# Patient Record
Sex: Male | Born: 1955
Health system: Southern US, Community
[De-identification: ages and names within clinical notes are randomized; demographics above are authoritative.]

## PROBLEM LIST (undated history)

## (undated) DIAGNOSIS — F32A Depression, unspecified: Secondary | ICD-10-CM

## (undated) DIAGNOSIS — F319 Bipolar disorder, unspecified: Secondary | ICD-10-CM

## (undated) DIAGNOSIS — F329 Major depressive disorder, single episode, unspecified: Secondary | ICD-10-CM

## (undated) DIAGNOSIS — G4733 Obstructive sleep apnea (adult) (pediatric): Secondary | ICD-10-CM

## (undated) DIAGNOSIS — F3170 Bipolar disorder, currently in remission, most recent episode unspecified: Secondary | ICD-10-CM

## (undated) DIAGNOSIS — I700 Atherosclerosis of aorta: Secondary | ICD-10-CM

## (undated) DIAGNOSIS — E119 Type 2 diabetes mellitus without complications: Secondary | ICD-10-CM

## (undated) DIAGNOSIS — I10 Essential (primary) hypertension: Secondary | ICD-10-CM

## (undated) DIAGNOSIS — F411 Generalized anxiety disorder: Secondary | ICD-10-CM

## (undated) DIAGNOSIS — H9190 Unspecified hearing loss, unspecified ear: Secondary | ICD-10-CM

## (undated) HISTORY — DX: Atherosclerosis of aorta: I70.0

## (undated) HISTORY — DX: Unspecified hearing loss, unspecified ear: H91.90

## (undated) HISTORY — PX: COLON SURGERY: SHX602

## (undated) HISTORY — DX: Bipolar disorder, currently in remission, most recent episode unspecified: F31.70

## (undated) HISTORY — DX: Type 2 diabetes mellitus without complications: E11.9

## (undated) HISTORY — DX: Obstructive sleep apnea (adult) (pediatric): G47.33

## (undated) HISTORY — PX: WISDOM TOOTH EXTRACTION: SHX21

## (undated) HISTORY — DX: Generalized anxiety disorder: F41.1

## (undated) HISTORY — PX: ANKLE SURGERY: SHX546

## (undated) HISTORY — PX: HAND SURGERY: SHX662

## (undated) HISTORY — DX: Essential (primary) hypertension: I10

## (undated) HISTORY — DX: Bipolar disorder, unspecified: F31.9

---

## 2000-10-30 ENCOUNTER — Ambulatory Visit (HOSPITAL_COMMUNITY): Admission: RE | Admit: 2000-10-30 | Discharge: 2000-10-30 | Payer: Self-pay | Admitting: Gastroenterology

## 2005-03-21 ENCOUNTER — Encounter: Admission: RE | Admit: 2005-03-21 | Discharge: 2005-03-21 | Payer: Self-pay | Admitting: Internal Medicine

## 2007-02-26 ENCOUNTER — Ambulatory Visit: Payer: Self-pay | Admitting: Psychology

## 2007-03-06 ENCOUNTER — Ambulatory Visit: Payer: Self-pay | Admitting: Psychology

## 2007-03-13 ENCOUNTER — Ambulatory Visit: Payer: Self-pay | Admitting: Psychology

## 2007-03-20 ENCOUNTER — Ambulatory Visit: Payer: Self-pay | Admitting: Psychology

## 2007-03-23 ENCOUNTER — Ambulatory Visit: Payer: Self-pay | Admitting: Psychology

## 2007-03-28 ENCOUNTER — Ambulatory Visit: Payer: Self-pay | Admitting: Psychology

## 2007-04-03 ENCOUNTER — Ambulatory Visit: Payer: Self-pay | Admitting: Psychology

## 2007-04-12 ENCOUNTER — Ambulatory Visit: Payer: Self-pay | Admitting: Psychology

## 2007-04-17 ENCOUNTER — Ambulatory Visit: Payer: Self-pay | Admitting: Psychology

## 2007-04-27 ENCOUNTER — Ambulatory Visit: Payer: Self-pay | Admitting: Psychology

## 2007-05-04 ENCOUNTER — Ambulatory Visit: Payer: Self-pay | Admitting: Psychology

## 2007-05-22 ENCOUNTER — Ambulatory Visit: Payer: Self-pay | Admitting: Psychology

## 2007-06-12 ENCOUNTER — Ambulatory Visit: Payer: Self-pay | Admitting: Psychology

## 2007-06-26 ENCOUNTER — Ambulatory Visit: Payer: Self-pay | Admitting: Psychology

## 2007-07-10 ENCOUNTER — Ambulatory Visit: Payer: Self-pay | Admitting: Psychology

## 2007-07-24 ENCOUNTER — Ambulatory Visit: Payer: Self-pay | Admitting: Psychology

## 2007-08-07 ENCOUNTER — Ambulatory Visit: Payer: Self-pay | Admitting: Psychology

## 2007-08-23 ENCOUNTER — Ambulatory Visit: Payer: Self-pay | Admitting: Psychology

## 2007-09-20 ENCOUNTER — Ambulatory Visit: Payer: Self-pay | Admitting: Psychology

## 2007-10-16 ENCOUNTER — Ambulatory Visit: Payer: Self-pay | Admitting: Psychology

## 2007-10-25 ENCOUNTER — Ambulatory Visit: Payer: Self-pay | Admitting: Psychology

## 2007-11-13 ENCOUNTER — Ambulatory Visit: Payer: Self-pay | Admitting: Psychology

## 2007-11-27 ENCOUNTER — Ambulatory Visit: Payer: Self-pay | Admitting: Psychology

## 2007-12-13 ENCOUNTER — Ambulatory Visit: Payer: Self-pay | Admitting: Psychology

## 2008-01-18 ENCOUNTER — Ambulatory Visit: Payer: Self-pay | Admitting: Psychology

## 2008-02-08 ENCOUNTER — Ambulatory Visit: Payer: Self-pay | Admitting: Psychology

## 2008-02-29 ENCOUNTER — Ambulatory Visit: Payer: Self-pay | Admitting: Psychology

## 2008-04-03 ENCOUNTER — Ambulatory Visit: Payer: Self-pay | Admitting: Psychology

## 2008-05-09 ENCOUNTER — Ambulatory Visit: Payer: Self-pay | Admitting: Psychology

## 2008-06-05 ENCOUNTER — Ambulatory Visit: Payer: Self-pay | Admitting: Psychology

## 2008-06-20 ENCOUNTER — Ambulatory Visit: Payer: Self-pay | Admitting: Psychology

## 2008-07-04 ENCOUNTER — Ambulatory Visit: Payer: Self-pay | Admitting: Psychology

## 2008-07-18 ENCOUNTER — Ambulatory Visit: Payer: Self-pay | Admitting: Psychology

## 2008-08-01 ENCOUNTER — Ambulatory Visit: Payer: Self-pay | Admitting: Psychology

## 2008-08-15 ENCOUNTER — Ambulatory Visit: Payer: Self-pay | Admitting: Psychology

## 2008-09-12 ENCOUNTER — Ambulatory Visit: Payer: Self-pay | Admitting: Psychology

## 2008-09-26 ENCOUNTER — Ambulatory Visit: Payer: Self-pay | Admitting: Psychology

## 2008-10-10 ENCOUNTER — Ambulatory Visit: Payer: Self-pay | Admitting: Psychology

## 2008-11-21 ENCOUNTER — Ambulatory Visit: Payer: Self-pay | Admitting: Psychology

## 2008-12-04 ENCOUNTER — Ambulatory Visit: Payer: Self-pay | Admitting: Psychology

## 2008-12-19 ENCOUNTER — Ambulatory Visit: Payer: Self-pay | Admitting: Psychology

## 2009-01-16 ENCOUNTER — Ambulatory Visit: Payer: Self-pay | Admitting: Psychology

## 2009-02-10 ENCOUNTER — Ambulatory Visit: Payer: Self-pay | Admitting: Psychology

## 2009-02-24 ENCOUNTER — Ambulatory Visit: Payer: Self-pay | Admitting: Psychology

## 2009-03-24 ENCOUNTER — Ambulatory Visit: Payer: Self-pay | Admitting: Psychology

## 2009-04-07 ENCOUNTER — Ambulatory Visit: Payer: Self-pay | Admitting: Psychology

## 2011-01-21 NOTE — Procedures (Signed)
[facility]  Patient:    Devin Ellison, Devin Ellison                     MRN: [id number] Proc. Date: 10/30/00 Adm. Date:  [id number] Attending:  [name] CC:         Evette Georges, M.D. [facility]   Procedure Report  PROCEDURES PERFORMED:  Colonoscopy.  MEDICATIONS:  Fentanyl 125 mcg, Versed 12 mg IV.  SCOPE UTILIZED:  Olympus adult video colonoscope.  INDICATIONS:  A nice 55 year old gentleman who has had previous colon polyps. He had a very flat villous adenoma of the cecum that required laparoscopically-assisted removal under general anesthesia several years ago. He had a colonoscopy three years ago that was negative. This is done as a three-year follow-up.  DESCRIPTION OF PROCEDURE:  The procedure had been explained to the patient and consent obtained. With the patient in the left lateral decubitus position the Olympus video pediatric video colonoscope was inserted and advanced under direct visualization. The prep was excellent and we were able to advance to the right lower quadrant under the surgical scar, what was felt to be the ileocecal valve and we saw the appendiceal orifice. There were a lot of surgical alterations there from his previous surgery. The light transilluminated it free. The scope was withdrawn. The cecum, ascending colon, hepatic flexure, transverse colon, splenic flexure, descending colon and sigmoid colon were seen well upon withdrawal and no polyps were seen. There is no significant diverticular disease. The scope was withdrawn. The patient tolerated the procedure well and was maintained on low-flow oxygen and pulse oximetry throughout the procedure without obvious problem.  ASSESSMENT:  No evidence of further colon polyps.  PLAN: 1. We plan on recommending repeating procedure in 5 years. 2. Would suggest yearly Hemoccults. DD:  10/30/00 TD:  10/31/00 Job: [id number]

## 2013-04-30 ENCOUNTER — Emergency Department (HOSPITAL_COMMUNITY)
Admission: EM | Admit: 2013-04-30 | Discharge: 2013-04-30 | Disposition: A | Discharge status: Other Acute Inpatient Hospital | Payer: 59 | Attending: Emergency Medicine | Admitting: Emergency Medicine

## 2013-04-30 ENCOUNTER — Inpatient Hospital Stay (HOSPITAL_COMMUNITY)
Admission: AD | Admit: 2013-04-30 | Discharge: 2013-05-06 | DRG: 885 | Disposition: A | Discharge status: Home / Self Care | Payer: 59 | Source: Intra-hospital | Attending: Psychiatry | Admitting: Psychiatry

## 2013-04-30 ENCOUNTER — Encounter (HOSPITAL_COMMUNITY): Payer: Self-pay

## 2013-04-30 ENCOUNTER — Encounter (HOSPITAL_COMMUNITY): Payer: Self-pay | Admitting: Emergency Medicine

## 2013-04-30 DIAGNOSIS — F603 Borderline personality disorder: Secondary | ICD-10-CM | POA: Diagnosis present

## 2013-04-30 DIAGNOSIS — F329 Major depressive disorder, single episode, unspecified: Secondary | ICD-10-CM

## 2013-04-30 DIAGNOSIS — T394X2A Poisoning by antirheumatics, not elsewhere classified, intentional self-harm, initial encounter: Secondary | ICD-10-CM | POA: Insufficient documentation

## 2013-04-30 DIAGNOSIS — F3010 Manic episode without psychotic symptoms, unspecified: Secondary | ICD-10-CM

## 2013-04-30 DIAGNOSIS — F3160 Bipolar disorder, current episode mixed, unspecified: Principal | ICD-10-CM | POA: Diagnosis present

## 2013-04-30 DIAGNOSIS — R45851 Suicidal ideations: Secondary | ICD-10-CM

## 2013-04-30 DIAGNOSIS — Z79899 Other long term (current) drug therapy: Secondary | ICD-10-CM

## 2013-04-30 DIAGNOSIS — F309 Manic episode, unspecified: Secondary | ICD-10-CM | POA: Insufficient documentation

## 2013-04-30 DIAGNOSIS — F17200 Nicotine dependence, unspecified, uncomplicated: Secondary | ICD-10-CM | POA: Insufficient documentation

## 2013-04-30 DIAGNOSIS — R4689 Other symptoms and signs involving appearance and behavior: Secondary | ICD-10-CM

## 2013-04-30 DIAGNOSIS — F32A Depression, unspecified: Secondary | ICD-10-CM

## 2013-04-30 DIAGNOSIS — F6089 Other specific personality disorders: Secondary | ICD-10-CM | POA: Insufficient documentation

## 2013-04-30 HISTORY — DX: Major depressive disorder, single episode, unspecified: F32.9

## 2013-04-30 HISTORY — DX: Depression, unspecified: F32.A

## 2013-04-30 LAB — RAPID URINE DRUG SCREEN, HOSP PERFORMED
Barbiturates: NOT DETECTED
Cocaine: NOT DETECTED
Opiates: NOT DETECTED
Tetrahydrocannabinol: NOT DETECTED

## 2013-04-30 LAB — COMPREHENSIVE METABOLIC PANEL
ALT: 10 U/L (ref 0–53)
AST: 12 U/L (ref 0–37)
Alkaline Phosphatase: 75 U/L (ref 39–117)
BUN: 10 mg/dL (ref 6–23)
CO2: 28 mEq/L (ref 19–32)
Creatinine, Ser: 0.85 mg/dL (ref 0.50–1.35)
Glucose, Bld: 116 mg/dL — ABNORMAL HIGH (ref 70–99)

## 2013-04-30 LAB — ACETAMINOPHEN LEVEL: Acetaminophen (Tylenol), Serum: <15 ug/mL (ref 10–30)

## 2013-04-30 LAB — CBC
Hemoglobin: 15.3 g/dL (ref 13.0–17.0)
MCHC: 34.3 g/dL (ref 30.0–36.0)
RBC: 4.83 MIL/uL (ref 4.22–5.81)

## 2013-04-30 LAB — ETHANOL: Alcohol, Ethyl (B): <11 mg/dL (ref 0–11)

## 2013-04-30 LAB — SALICYLATE LEVEL: Salicylate Lvl: <2 mg/dL — ABNORMAL LOW (ref 2.8–20.0)

## 2013-04-30 MED ORDER — MAGNESIUM HYDROXIDE 400 MG/5ML PO SUSP: 30.0000 mL | Freq: Every day | ORAL | Status: DC | PRN

## 2013-04-30 MED ORDER — POTASSIUM CHLORIDE CRYS ER 20 MEQ PO TBCR
20.0000 meq | EXTENDED_RELEASE_TABLET | Freq: Two times a day (BID) | ORAL | Status: AC
  Administered 2013-05-01 – 2013-05-02 (×3): 20 meq via ORAL
  Filled 2013-04-30 (×5): qty 1

## 2013-04-30 MED ORDER — LAMOTRIGINE 200 MG PO TABS
200.0000 mg | ORAL_TABLET | Freq: Every day | ORAL | Status: DC
  Administered 2013-04-30: 200 mg via ORAL
  Filled 2013-04-30: qty 1

## 2013-04-30 MED ORDER — ZOLPIDEM TARTRATE 5 MG PO TABS: 5.0000 mg | ORAL_TABLET | Freq: Every evening | ORAL | Status: DC | PRN

## 2013-04-30 MED ORDER — ALUM & MAG HYDROXIDE-SIMETH 200-200-20 MG/5ML PO SUSP: 30.0000 mL | ORAL | Status: DC | PRN

## 2013-04-30 MED ORDER — ACETAMINOPHEN 325 MG PO TABS: 650.0000 mg | ORAL_TABLET | ORAL | Status: DC | PRN

## 2013-04-30 MED ORDER — ACETAMINOPHEN 325 MG PO TABS: 650.0000 mg | ORAL_TABLET | Freq: Four times a day (QID) | ORAL | Status: DC | PRN

## 2013-04-30 MED ORDER — NICOTINE 21 MG/24HR TD PT24: 21.0000 mg | MEDICATED_PATCH | Freq: Every day | TRANSDERMAL | Status: DC

## 2013-04-30 MED ORDER — TRAZODONE HCL 100 MG PO TABS
50.0000 mg | ORAL_TABLET | Freq: Every evening | ORAL | Status: DC | PRN
  Administered 2013-05-01 – 2013-05-03 (×3): 50 mg via ORAL
  Filled 2013-04-30 (×15): qty 1

## 2013-04-30 MED ORDER — ACETAMINOPHEN 325 MG PO TABS
650.0000 mg | ORAL_TABLET | Freq: Four times a day (QID) | ORAL | Status: DC | PRN
Start: 2013-04-30 — End: 2013-05-06

## 2013-04-30 MED ORDER — NICOTINE 21 MG/24HR TD PT24
21.0000 mg | MEDICATED_PATCH | Freq: Every day | TRANSDERMAL | Status: DC
  Filled 2013-04-30: qty 1

## 2013-04-30 MED ORDER — NICOTINE 21 MG/24HR TD PT24
21.0000 mg | MEDICATED_PATCH | Freq: Every day | TRANSDERMAL | Status: DC
  Administered 2013-05-01 – 2013-05-06 (×6): 21 mg via TRANSDERMAL
  Filled 2013-04-30 (×9): qty 1

## 2013-04-30 MED ORDER — LAMOTRIGINE 100 MG PO TABS
200.0000 mg | ORAL_TABLET | Freq: Every day | ORAL | Status: DC
  Administered 2013-05-01 – 2013-05-06 (×6): 200 mg via ORAL
  Filled 2013-04-30 (×3): qty 1
  Filled 2013-04-30: qty 2
  Filled 2013-04-30 (×4): qty 1
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
## 2013-04-30 NOTE — BH Assessment (Signed)
Per [facility] NP, pt accepted to [facility], 501-1. Updated shift report & notified TTS that voluntary paperwork will need to be completed as writer's shift is ending and there isn't a TS available to cover next shift.   Evette Cristal, [place] Assessment Counselor

## 2013-04-30 NOTE — Progress Notes (Signed)
Pt confirms pcp is Dr "Reita Cliche" Dolittle Pt reports not seeing doctor in a long time but would return to see MD if needed EPIC updated
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
## 2013-04-30 NOTE — Progress Notes (Signed)
Pt didn't attend

## 2013-04-30 NOTE — ED Notes (Signed)
i asked patient if he would allow me to take his shoe strings out so he can transport across the street he started cursing and saying "my shoes cost a 150 motherfucking dollars why would you take some shoe strings out of my shoes if i wanted to do something i would of took this and he grabbed the vital machine cords and did something i explained to him i was just going by the policies and he yelled no rn latricia is aware

## 2013-04-30 NOTE — ED Notes (Signed)
Pt states SI/SA states took 63 200mg  iboprofen at 0830, pt cooperative, a/o x4

## 2013-04-30 NOTE — ED Provider Notes (Signed)
CSN: [id number]     Arrival date & time 04/30/13  1013 History   First MD Initiated Contact with Patient 04/30/13 1043     Chief Complaint  Patient presents with  . Drug Overdose   (Consider location/radiation/quality/duration/timing/severity/associated sxs/prior Treatment) HPI  Devin Ellison is a 57 year old male with past medical history of bipolar disorder he is currently taking Lamictal.  The patient was brought in by EMS.  He apparently called them and stated that he took 63 200 mg ibuprofen this morning at 8:30 AM approximately 3 and half hours ago.  The patient states that this is not true.  The patient states that he lied about taking the medication and because his girlfriend left him and he is unable to live without her.  He states that "God sent me this angel and I can't live without her."  The patient has pressured speech and labile behavior.  He is jovial and introduced himself stating that he is from [place].  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  The patient denies actual suicidal behavior but endorsed is severe depression and.  He states he has been taking supplemental as directed.  He denies audiovisual hallucinations or homicidal ideation.  The patient has a week the psychiatric history.  He states he is followed by an outpatient therapist.  Past Medical History  Diagnosis Date  . Depression    Past Surgical History  Procedure Laterality Date  . Colon surgery    . Ankle surgery    . Hand surgery     No family history on file. History  Substance Use Topics  . Smoking status: Current Every Day Smoker -- 1.00 packs/day    Types: Cigarettes  . Smokeless tobacco: Not on file  . Alcohol Use: Yes     Comment: social    Review of Systems  Constitutional: Negative for fever and chills.  Respiratory: Negative for cough and shortness of breath.   Cardiovascular: Negative for chest pain and  palpitations.  Gastrointestinal: Negative for vomiting, abdominal pain, diarrhea and constipation.  Genitourinary: Negative for dysuria, urgency and frequency.  Musculoskeletal: Negative for myalgias and arthralgias.  Skin: Negative for rash.  Neurological: Negative for headaches.  Psychiatric/Behavioral: Positive for suicidal ideas and decreased concentration. Negative for sleep disturbance and self-injury. The patient is hyperactive.     Allergies  Review of patient's allergies indicates no known allergies.  Home Medications  No current outpatient prescriptions on file. BP 157/99  Pulse 74  Temp(Src) 98.2 F (36.8 C) (Oral)  Resp 20  SpO2 100% Physical Exam  Nursing note and vitals reviewed. Constitutional: He appears well-developed and well-nourished. No distress.  HENT:  Head: Normocephalic and atraumatic.  Eyes: Conjunctivae are normal. No scleral icterus.  Neck: Normal range of motion. Neck supple.  Cardiovascular: Normal rate, regular rhythm and normal heart sounds.   Pulmonary/Chest: Effort normal and breath sounds normal. No respiratory distress.  Abdominal: Soft. There is no tenderness.  Musculoskeletal: He exhibits no edema.  Neurological: He is alert.  Skin: Skin is warm and dry. He is not diaphoretic.  Psychiatric: His affect is labile. His speech is rapid and/or pressured and tangential. He is hyperactive. He is not agitated, not aggressive and not combative. Thought content is delusional (questionable delusions of grandeur). Thought content is not paranoid. Cognition and memory are normal. He expresses impulsivity. He expresses no homicidal and no suicidal ideation. He expresses no  suicidal plans and no homicidal plans. He is attentive.    ED Course  Procedures (including critical care time) Labs Review Labs Reviewed  CBC - Abnormal; Notable for the following:    WBC 10.7 (*)    All other components within normal limits  COMPREHENSIVE METABOLIC PANEL -  Abnormal; Notable for the following:    Sodium 134 (*)    Potassium 3.2 (*)    Glucose, Bld 116 (*)    All other components within normal limits  SALICYLATE LEVEL - Abnormal; Notable for the following:    Salicylate Lvl <2.0 (*)    All other components within normal limits  ETHANOL  ACETAMINOPHEN LEVEL  URINE RAPID DRUG SCREEN (HOSP PERFORMED)   Imaging Review No results found.  MDM  No diagnosis found. Patient here after calling EMS for supposed that suicide attempt with Advil.  At emergency department he denies doing this.  The patient does have some labile emotions, speech is pressured and tangential, possible delusions of grandeur.  The patient appears well he is alert and oriented x4.  He denies active suicidal behavior but states he cannot live without his girlfriend.  His behavior seems manipulative.  No homicidal ideation or audiovisual hallucinations.  Labs are currently pending.  I will not begin treatment for Advil overdose until labs are complete.  Patient labs appear within normal limits.  I have spoken with the act team who will assess the patient.  He is able to move to the psychiatric ED.  The patient may need psychiatric evaluation, inpatient care or change in his medications.   he is exhibiting manic behavior  Arthor Captain, PA-C 04/30/13 1553

## 2013-04-30 NOTE — ED Notes (Signed)
Pt's wife is visiting at bedside. Security aware of situation

## 2013-04-30 NOTE — Tx Team (Signed)
Initial Interdisciplinary Treatment Plan  PATIENT STRENGTHS: (choose at least two) Average or above average intelligence Capable of independent living Communication skills Financial means Physical Health Supportive family/friends  PATIENT STRESSORS: Marital or family conflict   PROBLEM LIST: Problem List/Patient Goals Date to be addressed Date deferred Reason deferred Estimated date of resolution  Depression 04/30/2013     Suicide Risk 04/30/2013                                                DISCHARGE CRITERIA:  Improved stabilization in mood, thinking, and/or behavior Need for constant or close observation no longer present Verbal commitment to aftercare and medication compliance  PRELIMINARY DISCHARGE PLAN: Outpatient therapy Return to previous living arrangement Return to previous work or school arrangements  PATIENT/FAMIILY INVOLVEMENT: This treatment plan has been presented to and reviewed with the patient, Devin Ellison, and/or family member. The patient and family have been given the opportunity to ask questions and make suggestions.  Renaee Munda 04/30/2013, 10:02 PM

## 2013-05-01 DIAGNOSIS — F3160 Bipolar disorder, current episode mixed, unspecified: Principal | ICD-10-CM

## 2013-05-01 MED ORDER — ARIPIPRAZOLE 5 MG PO TABS
5.0000 mg | ORAL_TABLET | Freq: Every day | ORAL | Status: DC
  Administered 2013-05-01 – 2013-05-03 (×3): 5 mg via ORAL
  Filled 2013-05-01 (×5): qty 1

## 2013-05-01 NOTE — Tx Team (Signed)
Interdisciplinary Treatment Plan Update (Adult)  Date: 05/01/2013  Time Reviewed:  9:45 AM  Progress in Treatment: Attending groups: Yes Participating in groups:  Yes Taking medication as prescribed:  Yes Tolerating medication:  Yes Family/Significant othe contact made: CSW assessing  Patient understands diagnosis:  Yes Discussing patient identified problems/goals with staff:  Yes Medical problems stabilized or resolved:  Yes Denies suicidal/homicidal ideation: Yes Issues/concerns per patient self-inventory:  Yes Other:  New problem(s) identified: N/A  Discharge Plan or Barriers: CSW assessing for appropriate referrals.  Reason for Continuation of Hospitalization: Anxiety Depression Medication Stabilization  Comments: N/A  Estimated length of stay: 3-5 days  For review of initial/current patient goals, please see plan of care.  Attendees: Patient:     Family:     Physician:  Dr. Johnalagadda 05/01/2013 10:37 AM   Nursing:   Ronecia Byrd, RN 05/01/2013 10:37 AM   Clinical Social Worker:  Eladio Dentremont Horton, LCSWA 05/01/2013 10:37 AM   Other: Neil Mashburn, PA 05/01/2013 10:37 AM   Other:  Maseta Dorley, MA care coordination 05/01/2013 10:37 AM   Other:  Quylle Hodnett, LCSW 05/01/2013 10:37 AM   Other:  Jennifer Clark, RN 05/01/2013 10:37 AM   Other:    Other:    Other:    Other:    Other:    Other:     Scribe for Treatment Team:   Horton, Zayonna Ayuso Nicole, 05/01/2013 10:37 AM    

## 2013-05-01 NOTE — Progress Notes (Signed)
Adult Psychoeducational Group Note  Date:  05/01/2013 Time:  9:12 PM  Group Topic/Focus:  Wrap-Up Group:   The focus of this group is to help patients review their daily goal of treatment and discuss progress on daily workbooks.  Participation Level:  Active  Participation Quality:  Appropriate  Affect:  Appropriate  Cognitive:  Alert  Insight: Appropriate  Engagement in Group:  Engaged  Modes of Intervention:  Discussion  Additional Comments:  Pt stated that he had a "shitty" day. He was upset because he did not have a blanket on his bed, and he feels that the dr.is not listening to him/more his needs. However, he finds comfort in knowing that he is not the only one going through these issues.  Kaleen Odea R 05/01/2013, 9:12 PM

## 2013-05-01 NOTE — [facility] Suicide Risk Assessment (Signed)
Suicide Risk Assessment  Admission Assessment     Nursing information obtained from:  Patient Demographic factors:  Male;Divorced or widowed;Caucasian Current Mental Status:  Suicidal ideation indicated by patient;Self-harm thoughts Loss Factors:  Loss of significant relationship Historical Factors:  NA Risk Reduction Factors:  Employed;Living with another person, especially a relative;Positive social support  CLINICAL FACTORS:   Severe Anxiety and/or Agitation Bipolar Disorder:   Depressive phase Depression:   Aggression Anhedonia Hopelessness Impulsivity Insomnia Recent sense of peace/wellbeing Severe Unstable or Poor Therapeutic Relationship Previous Psychiatric Diagnoses and Treatments  COGNITIVE FEATURES THAT CONTRIBUTE TO RISK:  Closed-mindedness Loss of executive function Polarized thinking Thought constriction (tunnel vision)    SUICIDE RISK:   Moderate:  Frequent suicidal ideation with limited intensity, and duration, some specificity in terms of plans, no associated intent, good self-control, limited dysphoria/symptomatology, some risk factors present, and identifiable protective factors, including available and accessible social support.  PLAN OF CARE: Patient is admitted from [facility] for depression and suicidal ideation without specific plan. He has been on psychiatric care since 2005. He was seen dr. Toni Arthurs and Dr. Kem Kays in the past. He has broke up with his girl friend and divorced 2011.   I certify that inpatient services furnished can reasonably be expected to improve the patient's condition.   Devin Ellison., MD 05/01/2013, 7:35 PM
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
## 2013-05-01 NOTE — Progress Notes (Signed)
Adult Psychoeducational Group Note  Date:  05/01/2013 Time:  11:01 AM  Group Topic/Focus:  Personal Choices and Values:   The focus of this group is to help patients assess and explore the importance of values in their lives, how their values affect their decisions, how they express their values and what opposes their expression.  Participation Level:  Active  Participation Quality:  Appropriate and Attentive  Affect:  Appropriate  Cognitive:  Alert and Appropriate  Insight: Appropriate  Engagement in Group:  Engaged  Modes of Intervention:  Discussion  Additional Comments:  Today's group was a therapeutic activity. Pt attended group and was very engaged.     [place] Shi K 05/01/2013, 11:01 AM

## 2013-05-01 NOTE — Progress Notes (Signed)
D.  Pt. Reports passive SI with no plan.  Denies HI and denies A/V hallucinations.   Cast on left hand.  No pain reported. A.  Encouraged pt. To attend groups and work on Pharmacologist. R.  Pt. Contracts for safety and remains safe.

## 2013-05-01 NOTE — Progress Notes (Signed)
Adult Psychoeducational Group Note  Date:  05/01/2013 Time:  11:00AM Group Topic/Focus:  Personal Developement  Participation Level:  Active  Participation Quality:  Appropriate and Attentive  Affect:  Appropriate  Cognitive:  Alert and Appropriate  Insight: Appropriate  Engagement in Group:  Engaged  Modes of Intervention:  Discussion  Additional Comments:   Pt. Was attentive and appropriate during today's group discussion. Pt. Was able to write down values. Pt was able to decide if the way you are living now in live with those values. Pt. Was able to break down values in groups values, what do i want to happen and wham am i going to do about it.   Bing Plume D 05/01/2013, 11:45 AM

## 2013-05-01 NOTE — Progress Notes (Signed)
Recreation Therapy Notes  Date: 08.27.2014 Time: 3:00pm Location: [address] Dayroom  Group Topic: Leisure Education  Goal Area(s) Addresses:  Patient will verbalize activity of interest by end of group session. Patient will verbalize the ability to use positive leisure/recreation as a coping mechanism.  Behavioral Response: Appropriate, Engaged  Intervention: Air traffic controller  Activity: Leisure Time Management. Patients were given an worksheet outlining approximately 16 hours of the day, using various colors patients were asked to identify how they use their time. For example red = time at work, blue = time for self-care.   Education:  Discharge Planning.   Education Outcome: Acknowledges understanding  Clinical Observations/Feedback: Patient attended group session and completed activity. Patient did not contribute to opening or wrap up discussion, but appeared to actively listen as he maintained appropriate eye contact with speaker.   Marykay Lex Shermar Friedland, LRT/CTRS  Jearl Klinefelter 05/01/2013 4:25 PM

## 2013-05-01 NOTE — Progress Notes (Signed)
D: Patient appropriate and cooperative with staff and peers. Patient's affect/mood is depressed. He reported on the self inventory sheet that he slept well, appetite is good, energy level is low and ability to pay attention is poor. Patient rated depression and feelings of hopelessness "10". Writer observed patient interacting with peers in the dayroom. He's compliant with medication regimen and attending groups.  A: Support and encouragement provided to patient. Scheduled medications administered per MD orders. Maintain Q15 minute checks for safety.  R: Patient receptive. Passive SI, but contracts for safety. Denies SI/HI/AVH. Patient remains safe on the unit.

## 2013-05-01 NOTE — [facility] Group Notes (Signed)
[facility] LCSW Group Therapy  05/01/2013  1:15 PM   Type of Therapy:  Group Therapy  Participation Level:  Active  Participation Quality:  Appropriate and Attentive  Affect:  Appropriate, Irritable, Agitated, Depressed  Cognitive:  Alert and Appropriate  Insight:  Developing/Improving and Engaged  Engagement in Therapy:  Developing/Improving and Engaged  Modes of Intervention:  Clarification, Confrontation, Discussion, Education, Exploration, Limit-setting, Orientation, Problem-solving, Rapport Building, Dance movement psychotherapist, Socialization and Support  Summary of Progress/Problems: The topic for group today was emotional regulation.  This group focused on both positive and negative emotion identification and allowed group members to process ways to identify feelings, regulate negative emotions, and find healthy ways to manage internal/external emotions. Group members were asked to reflect on a time when their reaction to an emotion led to a negative outcome and explored how alternative responses using emotion regulation would have benefited them. Group members were also asked to discuss a time when emotion regulation was utilized when a negative emotion was experienced.  Pt states that he is dealing with hopelessness and depression.  Pt states that he has lost everything and doesn't see a purpose for living.  Pt states that the only thing that got him through in the past is ex wife/exgirlfriend.  Pt was unable to further process and presents hopeless and helpless at this time.  Pt actively listened to group discussion.    Devin Ellison, [place] 05/01/2013 2:53 PM

## 2013-05-01 NOTE — [facility] Group Notes (Signed)
[facility] LCSW Aftercare Discharge Planning Group Note  05/01/2013 8:45 AM  Participation Quality:  Alert and Appropriate   Mood/Affect:  Appropriate, Irritable, Agitated   Depression Rating: 10  Anxiety Rating:  10  Thoughts of Suicide:  Pt denies HI, endorses SI stating "if I had the opportunity"  Will you contract for safety?   Yes  Current AVH:  Pt denies  Plan for Discharge/Comments:  Pt attended discharge planning group and actively participated in group.  CSW provided pt with today's workbook.  Pt states that he came to the hospital for suicidal ideation. Pt states that he has outpatient providers in [place], where he resides.  CSW will assess for appropriate referrals.    Transportation Means: Pt denies access to transportation at this time  Supports: No supports mentioned at this time  Devin Ellison, LCSWA 05/01/2013 10:15 AM

## 2013-05-01 NOTE — Consult Note (Signed)
Agree with plan 

## 2013-05-02 DIAGNOSIS — F339 Major depressive disorder, recurrent, unspecified: Secondary | ICD-10-CM

## 2013-05-02 NOTE — Progress Notes (Signed)
Patient ID: Devin Ellison, male   DOB: 09/09/1955, 57 y.o.   MRN: [id number] D: Took over patient's care @ 2330. Patient in bed sleeping. Respiration regular and unlabored. No sign of distress noted at this time A: 15 mins checks for safety. R: Patient remains asleep. Pt is safe.

## 2013-05-02 NOTE — B[facility] Counselor (Signed)
Adult Comprehensive Assessment  Patient ID: Devin Ellison, male   DOB: 1956/03/05, 57 y.o.   MRN: [id number]  Information Source: Information source: Patient  Current Stressors:  Educational / Learning stressors: N/A Employment / Job issues: Employed - enjoys job Family Relationships: N/A Surveyor, quantity / Lack of resources (include bankruptcy): N/A Housing / Lack of housing: N/A Physical health (include injuries & life threatening diseases): broke hand when frustrated last week Social relationships: recent break up with girlfriend was stressful and SI Substance abuse: N/A Bereavement / Loss: N/A  Living/Environment/Situation:  Living Arrangements: Other (Comment) Living conditions (as described by patient or guardian): Pt states that he has a house in [place] and lives with a house maid.  Pt states that this is a good environment.  How long has patient lived in current situation?: 3 years What is atmosphere in current home: Supportive;Loving;Comfortable  Family History:  Marital status: Separated Separated, when?: 3 years ago separated from wife, recent broke up with long term girlfriend What types of issues is patient dealing with in the relationship?: pt states his mental health and alcohol abuse caused conflict in his relationships Additional relationship information: N/A Does patient have children?: Yes How many children?: 2 How is patient's relationship with their children?: Pt states that he has a good relationship with children.    Childhood History:  By whom was/is the patient raised?: Both parents Additional childhood history information: Pt states that he had a perfect childhood.  Pt states that his mom stayed at home with them and father worked.   Description of patient's relationship with caregiver when they were a child: Pt states that he got along well with parents growing up.  Patient's description of current relationship with people who raised him/her: Father is deceased,  mother in a retirement center in [place].  Pt states that he still visits his mom often. Does patient have siblings?: Yes Number of Siblings: 4 Description of patient's current relationship with siblings: Pt states that he has a good relationship with 2 living siblings.   Did patient suffer any verbal/emotional/physical/sexual abuse as a child?: No Did patient suffer from severe childhood neglect?: No Has patient ever been sexually abused/assaulted/raped as an adolescent or adult?: No Was the patient ever a victim of a crime or a disaster?: No Witnessed domestic violence?: No Has patient been effected by domestic violence as an adult?: No  Education:  Highest grade of school patient has completed: [employer] degree Currently a student?: No Learning disability?: No  Employment/Work Situation:   Employment situation: Employed Where is patient currently employed?: [employer] How long has patient been employed?: 4 months Patient's job has been impacted by current illness: Yes Describe how patient's job has been impacted: pt states that 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 due to his mental health and alcohol abuse What is the longest time patient has a held a job?: 28 years 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 Has patient ever been in the [employer]?: No Has patient ever served in combat?: No  Financial Resources:   Financial resources: Income from [employer] insurance Does patient have a representative payee or guardian?: No  Alcohol/Substance Abuse:   What has been your use of drugs/alcohol within the last 12 months?: Pt denies alcohol and drug abuse If attempted suicide, did drugs/alcohol play a role in this?: No Alcohol/Substance Abuse Treatment Hx: Denies past history If yes, describe treatment: N/A Has alcohol/substance abuse ever caused legal problems?: No  Social  Support System:   Patient's Community Support System: Good Describe Community  Support System: Pt states that his ex wife and family are very supportive. Type of faith/religion: [employer]riam Knuckles How does patient's faith help to cope with current illness?: prayer, church attendance, sing in the choir, attend seminars  Leisure/Recreation:   Leisure and Hobbies: play tennis, skiing, hike, reading, cook   Strengths/Needs:   What things does the patient do well?: pt states that he is good at anything he puts his mind to.  In what areas does patient struggle / problems for patient: Depression, anxiety, SI  Discharge Plan:   Does patient have access to transportation?: Yes Will patient be returning to same living situation after discharge?: Yes Currently receiving community mental health services: Yes (From Whom) ([facility] - Cat Zada Girt, NP, [facility]) If no, would patient like referral for services when discharged?: Yes (What county?) Does patient have financial barriers related to discharge medications?: No  Summary/Recommendations:     Patient is a 57 year old Caucasian Male with a diagnosis of Mood Disorder NOS.  Patient lives in [place] alone.  Pt states that a recent break up triggered depression and SI. Patient will benefit from crisis stabilization, medication evaluation, group therapy and psycho education in addition to case management for discharge planning.    [place], Salome Arnt. 05/02/2013

## 2013-05-02 NOTE — Progress Notes (Signed)
Adult Psychoeducational Group Note  Date:  05/02/2013 Time:  9:47 PM  Group Topic/Focus:  Making Healthy Choices:   The focus of this group is to help patients identify negative/unhealthy choices they were using prior to admission and identify positive/healthier coping strategies to replace them upon discharge.  Participation Level:  Active  Participation Quality:  Appropriate and Supportive  Affect:  Appropriate  Cognitive:  Alert and Appropriate  Insight: Appropriate  Engagement in Group:  Engaged and Supportive  Modes of Intervention:  Problem-solving  Additional Comments:  Devin Ellison was able to share with the group how he was having relationship problems.  He stated that being in [facility] gives him time to focus on himself and how he can make things better.    [name] 05/02/2013, 9:47 PM

## 2013-05-02 NOTE — [facility] Suicide Risk Assessment (Signed)
[facility] INPATIENT:  Family/Significant Other Suicide Prevention Education  Suicide Prevention Education:  Education Completed; Camarion Weier - wife [phone]),  (name of family member/significant other) has been identified by the patient as the family member/significant other with whom the patient will be residing, and identified as the person(s) who will aid the patient in the event of a mental health crisis (suicidal ideations/suicide attempt).  With written consent from the patient, the family member/significant other has been provided the following suicide prevention education, prior to the and/or following the discharge of the patient.  The suicide prevention education provided includes the following:  Suicide risk factors  Suicide prevention and interventions  National Suicide Hotline telephone number  [facility] assessment telephone number  [facility] Emergency Assistance 911  [facility] and/or Residential Mobile Crisis Unit telephone number  Request made of family/significant other to:  Remove weapons (e.g., guns, rifles, knives), all items previously/currently identified as safety concern.    Remove drugs/medications (over-the-counter, prescriptions, illicit drugs), all items previously/currently identified as a safety concern.  The family member/significant other verbalizes understanding of the suicide prevention education information provided.  The family member/significant other agrees to remove the items of safety concern listed above.  Wife wants to know if pt actually took the Advil as an overdose attempt, as she states pt lies a lot and doesn't know if he is telling the truth about it or not.    Carmina Miller 05/02/2013, 10:03 AM

## 2013-05-02 NOTE — [facility] Group Notes (Signed)
[facility] LCSW Group Therapy  [facility] 1:15 - 2:30 PM  05/02/2013   Type of Therapy:  Group Therapy  Participation Level:  Active  Participation Quality:  Attentive  Affect:  Appropriate  Cognitive:  Appropriate  Insight:  Developing/Improving and Engaged  Engagement in Therapy:  Developing/Improving Engaged  Modes of Intervention:  Discussion, Education, Exploration, Problem-Solving, Rapport Building, Support   Summary of Progress/Problems:  Patient listened attentively to speaker from [facility].  Patient shared he plans to try to connect to a [facility] when he returns to [place].   Wynn Banker 05/02/2013

## 2013-05-02 NOTE — Progress Notes (Signed)
Adult Psychoeducational Group Note  Date:  05/02/2013 Time:  11:00AM Group Topic/Focus:  Leisure and Lifeystyles Changes  Participation Level:  Active  Participation Quality:  Appropriate and Attentive  Affect:  Appropriate  Cognitive:  Alert and Appropriate  Insight: Appropriate  Engagement in Group:  Engaged  Modes of Intervention:  Discussion  Additional Comments:  Pt. Was attentive and appropriate during today's group discussion. Pt. Was able to complete self esteem worksheet on positive thing about yourself for each letter A-Z. Pt was able to come up with the following words in a group setting Nice and Zealous.   Bing Plume D 05/02/2013, 11:51 AM

## 2013-05-02 NOTE — Progress Notes (Signed)
Adult Psychoeducational Group Note  Date:  05/02/2013 Time:  9:00 AM  Group Topic/Focus:  Rediscovering Joy:   The focus of this group is to explore various ways to relieve stress in a positive manner.  Participation Level:  Active  Participation Quality:  Appropriate and Attentive  Affect:  Appropriate  Cognitive:  Alert and Oriented  Insight: Appropriate  Engagement in Group:  Engaged  Modes of Intervention:  Discussion  Additional Comments:  Pt. was not able to attend group in it's entirety because he had to be seen by the Child psychotherapist.  Inocente Barban E 05/02/2013, 10:37 AM

## 2013-05-02 NOTE — Progress Notes (Signed)
D: Patient's affect/mood is depressed. He reported on the self inventory sheet that he slept well, appetite is good, energy level is normal and ability to pay attention is improving. Patient rated depression and feelings of hopelessness "7". He voiced that he was feeling a little sad today, but much better than yesterday and the day he was admitted here to [facility].  A: Support and encouragement provided to patient. Administered scheduled medications per ordering MD. Monitor Q15 minute checks for safety.  R: Patient receptive. Denies SI/HI/AVH. Patient remains safe on the unit.

## 2013-05-02 NOTE — Progress Notes (Signed)
[facility] Group Notes:  (Nursing/MHT/Case Management/Adjunct)  Date:  05/02/2013  Time:  10:38 AM  Type of Therapy:  Therapeutic Activity  Participation Level:  Active  Participation Quality:  Appropriate and Attentive  Affect:  Appropriate  Cognitive:  Appropriate  Insight:  Appropriate  Engagement in Group:  Engaged  Modes of Intervention:  Activity and Socialization  Summary of Progress/Problems: Pt attended group and actively participated.  Caswell Corwin 05/02/2013, 10:38 AM

## 2013-05-02 NOTE — Progress Notes (Signed)
[facility] MD Progress Note  05/02/2013 1:52 PM Devin Ellison  MRN:  [id number]  Subjective:  Patient has been adjusting to the unit and feels depressed, anxious and worried. He has stated that his heart was broken, but start thinking that he has to move on, he spoke with his GF, Devin Bible and has decided to keep the friendship. He stated that he is feeling frustrated because he wants to spend rest of his life with her. He was angry and punched a wall prior to coming to the hospital on Friday night 04/26/13 and has broken 5 th meta tarsal, boxer's fracture and placed on splint, than seen a ortho and has placed cast on it. He has been taking his medication both Lamictal and Abilify and feels they are helping him. He has clear sensorium and able to think properly. He has denied current suicidal thoughts. He rates his depression 7/10, anxiety 6/10 and anger 1/10. His ex wife is supportive of him and visiting him in hospital. His GF is [place], [place] and unable to visit him.   Diagnosis:   DSM5: Schizophrenia Disorders:   Obsessive-Compulsive Disorders:   Trauma-Stressor Disorders:   Substance/Addictive Disorders:   Depressive Disorders:    Axis I: Bipolar, mixed vs MDD, recurrent   ADL's:  Intact  Sleep: Fair  Appetite:  Fair  Suicidal Ideation:  Continue to have suicidal but says he has less frequent thoughts Homicidal Ideation:  denied AEB (as evidenced by):  Psychiatric Specialty Exam: ROS  Blood pressure 120/91, pulse 92, temperature 97.9 F (36.6 C), temperature source Oral, resp. rate 16, height 5' 10.87" (1.8 m), weight 84.369 kg (186 lb).Body mass index is 26.04 kg/(m^2).  General Appearance: Casual and Guarded  Eye Contact::  Minimal  Speech:  Clear and Coherent  Volume:  Decreased  Mood:  Anxious, Depressed, Hopeless, Irritable and Worthless  Affect:  Constricted and Depressed  Thought Process:  Coherent and Goal Directed  Orientation:  Full (Time, Place, and Person)  Thought Content:   Obsessions and Rumination  Suicidal Thoughts:  Yes.  without intent/plan  Homicidal Thoughts:  No  Memory:  Immediate;   Fair  Judgement:  Impaired  Insight:  Lacking  Psychomotor Activity:  Psychomotor Retardation  Concentration:  Fair  Recall:  Fair  Akathisia:  NA  Handed:  Right  AIMS (if indicated):     Assets:  Communication Skills Desire for Improvement Housing Physical Health Resilience Social Support Transportation  Sleep:  Number of Hours: 6   Current Medications: Current Facility-Administered Medications  Medication Dose Route Frequency Provider Last Rate Last Dose  . acetaminophen (TYLENOL) tablet 650 mg  650 mg Oral Q6H PRN Shuvon Rankin, NP      . alum & mag hydroxide-simeth (MAALOX/MYLANTA) 200-200-20 MG/5ML suspension 30 mL  30 mL Oral Q4H PRN Shuvon Rankin, NP      . ARIPiprazole (ABILIFY) tablet 5 mg  5 mg Oral Daily Verne Spurr, PA-C   5 mg at 05/02/13 0801  . lamoTRIgine (LAMICTAL) tablet 200 mg  200 mg Oral Daily Shuvon Rankin, NP   200 mg at 05/02/13 0801  . magnesium hydroxide (MILK OF MAGNESIA) suspension 30 mL  30 mL Oral Daily PRN Shuvon Rankin, NP      . nicotine (NICODERM CQ - dosed in mg/24 hours) patch 21 mg  21 mg Transdermal Daily Kerry Hough, PA-C   21 mg at 05/02/13 0806  . potassium chloride SA (K-DUR,KLOR-CON) CR tablet 20 mEq  20 mEq Oral BID  Kerry Hough, PA-C   20 mEq at 05/02/13 0800  . traZODone (DESYREL) tablet 50 mg  50 mg Oral QHS,MR X 1 Spencer E Simon, PA-C   50 mg at 05/01/13 2141  . zolpidem (AMBIEN) tablet 5 mg  5 mg Oral QHS PRN Shuvon Rankin, NP        Lab Results: No results found for this or any previous visit (from the past 48 hour(s)).  Physical Findings: AIMS: Facial and Oral Movements Muscles of Facial Expression: None, normal Lips and Perioral Area: None, normal Jaw: None, normal Tongue: None, normal,Extremity Movements Upper (arms, wrists, hands, fingers): None, normal Lower (legs, knees, ankles, toes):  None, normal, Trunk Movements Neck, shoulders, hips: None, normal, Overall Severity Severity of abnormal movements (highest score from questions above): None, normal Incapacitation due to abnormal movements: None, normal Patient's awareness of abnormal movements (rate only patient's report): No Awareness, Dental Status Current problems with teeth and/or dentures?: No Does patient usually wear dentures?: No  CIWA:  CIWA-Ar Total: 0 COWS:  COWS Total Score: 0  Treatment Plan Summary: Daily contact with patient to assess and evaluate symptoms and progress in treatment Medication management  Plan: Continue Lamictal and Abilify and monitor for adverse effects and therapeutic responses 1. Admit for crisis management and stabilization. 2. Medication management to reduce current symptoms to base line and improve the patient's overall level of functioning. 3. Treat health problems as indicated. 4. Develop treatment plan to decrease risk of relapse upon discharge and to reduce the need for readmission. 5. Psycho-social education regarding relapse prevention and self care. 6. Health care follow up as needed for medical problems. 7. Restart home medications where appropriate.    Medical Decision Making Problem Points:  Established problem, worsening (2), Review of last therapy session (1) and Review of psycho-social stressors (1) Data Points:  Review or order clinical lab tests (1) Review or order medicine tests (1) Review of medication regiment & side effects (2) Review of new medications or change in dosage (2)  I certify that inpatient services furnished can reasonably be expected to improve the patient's condition.   Nehemiah Settle., MD 05/02/2013, 1:52 PM

## 2013-05-03 MED ORDER — ARIPIPRAZOLE 10 MG PO TABS
10.0000 mg | ORAL_TABLET | Freq: Every day | ORAL | Status: DC
  Administered 2013-05-04 – 2013-05-06 (×3): 10 mg via ORAL
  Filled 2013-05-03 (×4): qty 1

## 2013-05-03 NOTE — Progress Notes (Signed)
Date: 05/03/2013  Time: 9:11 PM  Group Topic/Focus:  Wrap-Up Group: The focus of this group is to help patients review their daily goal of treatment and discuss progress on daily workbooks.  Participation Level: Active  Participation Quality: Appropriate, Sharing and Supportive  Affect: Appropriate  Cognitive: Appropriate  Insight: Appropriate  Engagement in Group: Engaged and Supportive  Modes of Intervention: Discussion, Education and Support  Additional Comments: pt attend group, pt is working on getting on the right medication and coping skills for SI and depression.  Isla Pence M  05/03/2013, 9:11 PM

## 2013-05-03 NOTE — Tx Team (Signed)
Interdisciplinary Treatment Plan Update (Adult)  Date: 05/03/2013  Time Reviewed:  9:45 AM  Progress in Treatment: Attending groups: Yes Participating in groups:  Yes Taking medication as prescribed:  Yes Tolerating medication:  Yes Family/Significant othe contact made: Yes Patient understands diagnosis:  Yes Discussing patient identified problems/goals with staff:  Yes Medical problems stabilized or resolved:  Yes Denies suicidal/homicidal ideation: Yes Issues/concerns per patient self-inventory:  Yes Other:  New problem(s) identified: N/A  Discharge Plan or Barriers: Pt will follow up with [facility] and [facility] for medication management and therapy.    Reason for Continuation of Hospitalization: Anxiety Depression Medication Stabilization  Comments: N/A  Estimated length of stay: 3-5 days  For review of initial/current patient goals, please see plan of care.  Attendees: Patient:     Family:     Physician:  Dr. Javier Glazier 05/03/2013 10:47 AM   Nursing:   Lowella Grip, RN 05/03/2013 10:47 AM   Clinical Social Worker:  Reyes Ivan, LCSWA 05/03/2013 10:47 AM   Other: Verne Spurr, PA 05/03/2013 10:47 AM   Other:  Frankey Shown, MA care coordination 05/03/2013 10:47 AM   Other:  Juline Patch, LCSW 05/03/2013 10:47 AM   Other:     Other:    Other:    Other:    Other:    Other:    Other:     Scribe for Treatment Team:   Carmina Miller, 05/03/2013 10:47 AM

## 2013-05-03 NOTE — Progress Notes (Signed)
Patient ID: Devin Ellison, male   DOB: [birth year], 57 y.o.   MRN: [id number] [facility] MD Progress Note  05/03/2013 12:31 PM Devin Ellison  MRN:  [id number]  Subjective:  Patient has been feeling depressed, anxious and worried. He has stated that his heart was broken, but start thinking that he has to move on, he spoke with his GF, Patient has decided to keep the friendship with her. He stated that he is feeling frustrated because he wants to spend rest of his life with her.   He was angry and punched a wall prior to coming to the hospital on Friday night 04/26/13 and has broken 5 th meta tarsal, boxer's fracture and placed on splint, than seen a ortho and has placed cast on it. He has been compliant wit his medication Lamictal and Abilify and has no reported side effects.   He has denied current suicidal thoughts. He rates his depression 7/10, anxiety 6/10 and anger 1/10. His ex wife is supportive of him and visiting him in hospital. His GF lives is [place], [place].   Diagnosis:   DSM5: Schizophrenia Disorders:   Obsessive-Compulsive Disorders:   Trauma-Stressor Disorders:   Substance/Addictive Disorders:   Depressive Disorders:    Axis I: Bipolar, mixed vs MDD, recurrent   ADL's:  Intact  Sleep: Fair  Appetite:  Fair  Suicidal Ideation:  Continue to have suicidal but says he has less frequent thoughts Homicidal Ideation:  denied AEB (as evidenced by):  Psychiatric Specialty Exam: ROS  Blood pressure 155/127, pulse 85, temperature 97.8 F (36.6 C), temperature source Oral, resp. rate 18, height 5' 10.87" (1.8 m), weight 84.369 kg (186 lb).Body mass index is 26.04 kg/(m^2).  General Appearance: Casual and Guarded  Eye Contact::  Minimal  Speech:  Clear and Coherent  Volume:  Decreased  Mood:  Anxious, Depressed, Hopeless, Irritable and Worthless  Affect:  Constricted and Depressed  Thought Process:  Coherent and Goal Directed  Orientation:  Full (Time, Place, and Person)  Thought  Content:  Obsessions and Rumination  Suicidal Thoughts:  Yes.  without intent/plan  Homicidal Thoughts:  No  Memory:  Immediate;   Fair  Judgement:  Impaired  Insight:  Lacking  Psychomotor Activity:  Psychomotor Retardation  Concentration:  Fair  Recall:  Fair  Akathisia:  NA  Handed:  Right  AIMS (if indicated):     Assets:  Communication Skills Desire for Improvement Housing Physical Health Resilience Social Support Transportation  Sleep:  Number of Hours: 6.25   Current Medications: Current Facility-Administered Medications  Medication Dose Route Frequency Provider Last Rate Last Dose  . acetaminophen (TYLENOL) tablet 650 mg  650 mg Oral Q6H PRN Shuvon Rankin, NP      . alum & mag hydroxide-simeth (MAALOX/MYLANTA) 200-200-20 MG/5ML suspension 30 mL  30 mL Oral Q4H PRN Shuvon Rankin, NP      . [START ON 05/04/2013] ARIPiprazole (ABILIFY) tablet 10 mg  10 mg Oral Daily Nehemiah Settle, MD      . lamoTRIgine (LAMICTAL) tablet 200 mg  200 mg Oral Daily Shuvon Rankin, NP   200 mg at 05/03/13 0749  . magnesium hydroxide (MILK OF MAGNESIA) suspension 30 mL  30 mL Oral Daily PRN Shuvon Rankin, NP      . nicotine (NICODERM CQ - dosed in mg/24 hours) patch 21 mg  21 mg Transdermal Daily Kerry Hough, PA-C   21 mg at 05/03/13 0645  . traZODone (DESYREL) tablet 50 mg  50 mg Oral  QHS,MR X 1 Kerry Hough, PA-C   50 mg at 05/02/13 2235    Lab Results: No results found for this or any previous visit (from the past 48 hour(s)).  Physical Findings: AIMS: Facial and Oral Movements Muscles of Facial Expression: None, normal Lips and Perioral Area: None, normal Jaw: None, normal Tongue: None, normal,Extremity Movements Upper (arms, wrists, hands, fingers): None, normal Lower (legs, knees, ankles, toes): None, normal, Trunk Movements Neck, shoulders, hips: None, normal, Overall Severity Severity of abnormal movements (highest score from questions above): None,  normal Incapacitation due to abnormal movements: None, normal Patient's awareness of abnormal movements (rate only patient's report): No Awareness, Dental Status Current problems with teeth and/or dentures?: No Does patient usually wear dentures?: No  CIWA:  CIWA-Ar Total: 0 COWS:  COWS Total Score: 0  Treatment Plan Summary: Daily contact with patient to assess and evaluate symptoms and progress in treatment Medication management  Plan: Continue Lamictal and increase Abilify 10 mg Qhs for better control of manic symptoms and monitor for adverse effects and therapeutic responses 1. Admit for crisis management and stabilization. 2. Medication management to reduce current symptoms to base line and improve the patient's overall level of functioning. 3. Treat health problems as indicated. 4. Develop treatment plan to decrease risk of relapse upon discharge and to reduce the need for readmission. 5. Psycho-social education regarding relapse prevention and self care. 6. Health care follow up as needed for medical problems. 7. Restart home medications where appropriate.  Medical Decision Making Problem Points:  Established problem, worsening (2), Review of last therapy session (1) and Review of psycho-social stressors (1) Data Points:  Review or order clinical lab tests (1) Review or order medicine tests (1) Review of medication regiment & side effects (2) Review of new medications or change in dosage (2)  I certify that inpatient services furnished can reasonably be expected to improve the patient's condition.   Nehemiah Settle., MD 05/03/2013, 12:31 PM

## 2013-05-03 NOTE — [facility] Group Notes (Signed)
[facility] LCSW Aftercare Discharge Planning Group Note  05/03/2013 8:45 AM  Participation Quality:  Alert and Appropriate   Mood/Affect:  Appropriate, Flat and Depressed  Depression Rating: 3  Anxiety Rating:  3-4  Thoughts of Suicide:  Pt denies SI/HI  Will you contract for safety?   Yes  Current AVH:  Pt denies  Plan for Discharge/Comments:  Pt attended discharge planning group and actively participated in group.  Pt states that he is feeling better today. Pt states that he plans to spend time with his family in [place] upon d/c, before returning home in [place].  Pt has follow up scheduled at [facility] and [facility] for medication management and therapy. No further needs voiced by pt at this time.    Transportation Means: Pt denies access to transportation at this time  Supports: No supports mentioned at this time  Reyes Ivan, LCSWA 05/03/2013 9:50 AM

## 2013-05-03 NOTE — Progress Notes (Signed)
D: Patient pleasant and cooperative with staff and peers. Patient's affect is depressed and mood is anxious. He reported on the self inventory sheet that he slept well last night, appetite and ability to pay attention are both good and energy level is normal. Patient rated depression and feelings of hopelessness "3". Around noon today, patient began to express that he's wanting to be d/c and actually see his medical doctor to follow-up with his care. Patient signed a 72 hour request for discharge.  A: Support and encouragement provided to patient. Scheduled medications administered per MD orders. Maintain Q15 minute checks for safety.  R: Patient receptive. Denies SI/HI/AVH. Patient remains safe.

## 2013-05-03 NOTE — Progress Notes (Signed)
Patient ID: Devin Ellison, male   DOB: 08-13-1956, 57 y.o.   MRN: [id number] D: Patient stated he is doing well and was quite upset today because the doctor didn't discuss discharge date with him. Pt signed a 72 hour release. Pt mood/affect seemed depressed but appropriate.  Pt denies SI/HI/AVH and pain. Pt attended evening wrap up group and was engaged and supportive of peers. Pt stated he learned in group since  been at [facility]  is recognize triggers and early warning signs to help him deal with his depression.  Pt denies any needs or concerns.  Cooperative with assessment. No acute distressed noted at this time.   A: Met with pt 1:1. Medications administered as prescribed. Writer encouraged pt to discuss feelings. Pt encouraged to come to staff with any question or concerns. 15 minutes checks for safety.  R: Patient remains safe. He is complaint with medications and denies any adverse reaction. Continue current POC.

## 2013-05-03 NOTE — [facility] Group Notes (Signed)
[facility] LCSW Group Therapy  05/03/2013  1:15 PM   Type of Therapy:  Group Therapy  Participation Level:  Active  Participation Quality:  Appropriate and Attentive  Affect:  Appropriate and Flat  Cognitive:  Alert and Appropriate  Insight:  Developing/Improving and Engaged  Engagement in Therapy:  Developing/Improving and Engaged  Modes of Intervention:  Clarification, Confrontation, Discussion, Education, Exploration, Limit-setting, Orientation, Problem-solving, Rapport Building, Dance movement psychotherapist, Socialization and Support  Summary of Progress/Problems: The topic for today was feelings about relapse.  Pt discussed what relapse prevention is to them and identified triggers that they are on the path to relapse.  Pt processed their feeling towards relapse and was able to relate to peers.  Pt discussed coping skills that can be used for relapse prevention.  Pt discussed the recent loss of his marriage and break up with a girlfriend causing this relapse of depression and SI.  Pt states that through being here he has learned to not let relationships consume him or dictate how he should feel.  Pt states that he plans to not isolate and talk to his support system.  Pt verbalizes great insight but it is questionable if pt truly feels this way.  Pt actively participated and was engaged in group discussion.    Devin Ellison, LCSWA 05/03/2013 2:04 PM

## 2013-05-03 NOTE — Progress Notes (Signed)
Patient ID: Devin Ellison, male   DOB: 10-18-1955, 57 y.o.   MRN: [id number] D: Took over patient's care @ 2330. Patient in bed sleeping. Respiration regular and unlabored. No sign of distress noted at this time A: 15 mins checks for safety. R: Patient  is safe.

## 2013-05-03 NOTE — ED Provider Notes (Signed)
Medical screening examination/treatment/procedure(s) were performed by non-physician practitioner and as supervising physician I was immediately available for consultation/collaboration.   Claudean Kinds, MD 05/03/13 [phone]

## 2013-05-03 NOTE — Progress Notes (Signed)
Adult Psychoeducational Group Note  Date:  05/03/2013 Time:  12:09 PM  Group Topic/Focus:  Relapse Prevention Planning:   The focus of this group is to define relapse and discuss the need for planning to combat relapse.  Participation Level:  Minimal  Participation Quality:  Inattentive  Affect:  Lethargic  Cognitive:  Appropriate  Insight: Good  Engagement in Group:  Lacking and Poor  Modes of Intervention:  Discussion, Socialization and Support  Additional Comments:  Pt came to group and was sleeping on occasion. Pt did share that breathing techniques and talking to a friend will be his go to techniques when faced with relapse.   Cathlean Cower 05/03/2013, 12:09 PM

## 2013-05-04 DIAGNOSIS — F411 Generalized anxiety disorder: Secondary | ICD-10-CM

## 2013-05-04 DIAGNOSIS — F332 Major depressive disorder, recurrent severe without psychotic features: Secondary | ICD-10-CM

## 2013-05-04 NOTE — [facility] Group Notes (Signed)
[facility] Group Notes:  (Clinical Social Work)  05/04/2013   3:00-4:00PM  Summary of Progress/Problems:   The main focus of today's process group was for the patient to identify ways in which they have sabotaged their own mental health wellness/recovery.  Motivational interviewing was used to explore the reasons they engage in this behavior, and reasons they may have for wanting to change.  The Stages of Change were explained to the group using a handout, and patients identified where they are with regard to changing self-defeating behaviors.  The patient expressed that he isolates himself and while doing this is consumed by obsessive thoughts, which became worse consistently due to the isolation and inability to have someone "reality check" them for him.  He does have 2 friends that he talks to at times, but he does not want to overburden them.  He is in Preparation Stage, has signed up for 2 support groups, and wants to continue to work on the self-help aspect of his issues.  Type of Therapy:  Process Group  Participation Level:  Active  Participation Quality:  Attentive, Monopolizing, Redirectable and Sharing  Affect:  Blunted  Cognitive:  Appropriate and Oriented  Insight:  Developing/Improving  Engagement in Therapy:  Engaged  Modes of Intervention:  Education, Motivational Interviewing   Ambrose Mantle, LCSW 05/04/2013, 4:39 PM

## 2013-05-04 NOTE — Progress Notes (Signed)
Adult Psychoeducational Group Note  Date:  05/04/2013 Time:  1:15pm Group Topic/Focus:  Therapeutic Activity  Participation Level:  Active  Participation Quality:  Appropriate and Attentive  Affect:  Appropriate  Cognitive:  Appropriate  Insight: Appropriate  Engagement in Group:  Engaged  Modes of Intervention:  Discussion and Education  Additional Comments:  Pt attended and participated in group.  Shelly Bombard D 05/04/2013, 2:14 PM

## 2013-05-04 NOTE — Progress Notes (Addendum)
Patient ID: Devin Ellison, male   DOB: 08-23-1956, 57 y.o.   MRN: [id number] 05-04-13  Nursing shift note: D: RN did 1:1 with pt this am. He came here from [facility] for this hospitalization. He had a suicide attempt by OD on Advil. He stated he remains depressed, has a hx of bipolar disorder and denied any si/hi/av presently. A: staff will support and encourage this patient. Also during the assessment with RN he omitted important facts about his overall situation. R: on his inventory sheet he wrote he slept well, appetite good, energy normal, attention good with his depression and hopelessness both at 2. He has had no physical problems in the last 24 hrs. Pain is a 1. After discharge he plans to "not isolate, recognize waring sign and apply coping skills". He wants to know what he needs to achieve or do to be discharged. Can someone tell me what is needed for me to improve? This was discussed in the 1:1 with RN.

## 2013-05-04 NOTE — Progress Notes (Signed)
Patient ID: Devin Ellison, male   DOB: 1956-02-04, 57 y.o.   MRN: [id number] Psychoeducational Group Note  Date:  05/04/2013 Time:1100am  Group Topic/Focus:  Identifying Needs:   The focus of this group is to help patients identify their personal needs that have been historically problematic and identify healthy behaviors to address their needs.  Participation Level:  Active  Participation Quality:  Appropriate  Affect:  Appropriate  Cognitive:  Appropriate  Insight:  Supportive  Engagement in Group:  Supportive  Additional Comments:  Psychoeducational group - life skills.   Valente David 05/04/2013,12:13 PM

## 2013-05-04 NOTE — Progress Notes (Signed)
[facility] MD Progress Note  05/04/2013 1:56 PM Devin Ellison  MRN:  [id number] Subjective:  3/10 depression, denies suicidal/homicidal ideations and hallucinations, upset with himself because he lied to his girlfriend and never told his adult children about his relationship.  His girlfriend ended their relationship on Sunday due to his dishonesty and just wants to be friends.  They have been together a year and half, he was separated a year and a half before they met and did not think he would ever get over his ex-wife but then fell madly in love with her.  He told her he was divorced but was only separated.  Grandiose at times with his job. Diagnosis:   DSM5:  Depressive Disorders:  Major Depressive Disorder - Severe (296.23)  Axis I: Anxiety Disorder NOS and Major Depression, Recurrent severe Axis II: Deferred Axis III:  Past Medical History  Diagnosis Date  . Depression    Axis IV: other psychosocial or environmental problems, problems related to social environment and problems with primary support group Axis V: 41-50 serious symptoms  ADL's:  Intact  Sleep: Good  Appetite:  Good  Suicidal Ideation:  Denies Homicidal Ideation:  Denies  Psychiatric Specialty Exam: Review of Systems  Constitutional: Negative.   HENT: Negative.   Eyes: Negative.   Respiratory: Negative.   Cardiovascular: Negative.   Gastrointestinal: Negative.   Genitourinary: Negative.   Musculoskeletal: Negative.   Skin: Negative.   Neurological: Negative.   Endo/Heme/Allergies: Negative.   Psychiatric/Behavioral: Positive for depression. The patient is nervous/anxious.     Blood pressure 132/97, pulse 85, temperature 97.2 F (36.2 C), temperature source Oral, resp. rate 16, height 5' 10.87" (1.8 m), weight 84.369 kg (186 lb).Body mass index is 26.04 kg/(m^2).  General Appearance: Casual  Eye Contact::  Fair  Speech:  Normal Rate  Volume:  Normal  Mood:  Anxious and Depressed  Affect:  Congruent   Thought Process:  Coherent  Orientation:  Full (Time, Place, and Person)  Thought Content:  WDL  Suicidal Thoughts:  No  Homicidal Thoughts:  No  Memory:  Immediate;   Fair Recent;   Fair Remote;   Fair  Judgement:  Impaired  Insight:  Fair  Psychomotor Activity:  Normal  Concentration:  Fair  Recall:  Fair  Akathisia:  No  Handed:  Right  AIMS (if indicated):     Assets:  Communication Skills Resilience Social Support  Sleep:  Number of Hours: 5.25   Current Medications: Current Facility-Administered Medications  Medication Dose Route Frequency Provider Last Rate Last Dose  . acetaminophen (TYLENOL) tablet 650 mg  650 mg Oral Q6H PRN Shuvon Rankin, NP      . alum & mag hydroxide-simeth (MAALOX/MYLANTA) 200-200-20 MG/5ML suspension 30 mL  30 mL Oral Q4H PRN Shuvon Rankin, NP      . ARIPiprazole (ABILIFY) tablet 10 mg  10 mg Oral Daily Nehemiah Settle, MD   10 mg at 05/04/13 0814  . lamoTRIgine (LAMICTAL) tablet 200 mg  200 mg Oral Daily Shuvon Rankin, NP   200 mg at 05/04/13 0814  . magnesium hydroxide (MILK OF MAGNESIA) suspension 30 mL  30 mL Oral Daily PRN Shuvon Rankin, NP      . nicotine (NICODERM CQ - dosed in mg/24 hours) patch 21 mg  21 mg Transdermal Daily Kerry Hough, PA-C   21 mg at 05/04/13 0813  . traZODone (DESYREL) tablet 50 mg  50 mg Oral QHS,MR X 1 Kerry Hough, PA-C  50 mg at 05/03/13 2236    Lab Results: No results found for this or any previous visit (from the past 48 hour(s)).  Physical Findings: AIMS: Facial and Oral Movements Muscles of Facial Expression: None, normal Lips and Perioral Area: None, normal Jaw: None, normal Tongue: None, normal,Extremity Movements Upper (arms, wrists, hands, fingers): None, normal Lower (legs, knees, ankles, toes): None, normal, Trunk Movements Neck, shoulders, hips: None, normal, Overall Severity Severity of abnormal movements (highest score from questions above): None, normal Incapacitation due  to abnormal movements: None, normal Patient's awareness of abnormal movements (rate only patient's report): No Awareness, Dental Status Current problems with teeth and/or dentures?: No Does patient usually wear dentures?: No  CIWA:  CIWA-Ar Total: 0 COWS:  COWS Total Score: 0  Treatment Plan Summary: Daily contact with patient to assess and evaluate symptoms and progress in treatment Medication management  Plan:  Review of chart, vital signs, medications, and notes. 1-Individual and group therapy 2-Medication management for depression and anxiety:  Medications reviewed with the patient and no untoward effects, no changes made 3-Coping skills for depression and anxiety 4-Continue crisis stabilization and management 5-Address health issues--monitoring vital signs, stable 6-Treatment plan in progress to prevent relapse of depression and anxiety  Medical Decision Making Problem Points:  Established problem, stable/improving (1) and Review of psycho-social stressors (1) Data Points:  Review of medication regiment & side effects (2)  I certify that inpatient services furnished can reasonably be expected to improve the patient's condition.   Nanine Means, PMH-NP 05/04/2013, 1:56 PM

## 2013-05-05 NOTE — Progress Notes (Signed)
Patient ID: Devin Ellison, male   DOB: 1956/04/18, 57 y.o.   MRN: [id number] Psychoeducational Group Note  Date:  05/05/2013 Time:  0930am  Group Topic/Focus:  Making Healthy Choices:   The focus of this group is to help patients identify negative/unhealthy choices they were using prior to admission and identify positive/healthier coping strategies to replace them upon discharge.  Participation Level:  Active  Participation Quality:  Appropriate  Affect:  Appropriate  Cognitive:  Appropriate  Insight:  Supportive  Engagement in Group:  Supportive  Additional Comments:  Psychoeducational group-greatfulness.   Valente David 05/05/2013,10:10 AM

## 2013-05-05 NOTE — Progress Notes (Signed)
Patient ID: Devin Ellison, male   DOB: 1956-01-05, 57 y.o.   MRN: [id number] 05-05-13 nursing shift note: RN did 1:1 with pt. Pt has filled out a 72 hour request for discharge form on 05-03-13 @ 1225 pm. He stated that he is feeling better and that the psycheducational groups have been helpful. He denied any si/hi/av. A: staff will continue to support and encourage. He stated he is not having adverse effects from his medications. R: on his inventory sheet he wrote he slept well, appetite good, energy normal, attention good with is depression and hopelessness both at 1. W/d symptoms and na and he didn't address any pain issues. After discharge he plans to "stay with professional care, therapy groups and focus on coping". RN will monitor and Q 15 min ck's continue.

## 2013-05-05 NOTE — Progress Notes (Signed)
Patient ID: ZIMIR KITTLESON, male   DOB: 04/24/1956, 57 y.o.   MRN: [id number] Psychoeducational Group Note  Date:  05/05/2013 Time:  1045am  Group Topic/Focus:  Making Healthy Choices:   The focus of this group is to help patients identify negative/unhealthy choices they were using prior to admission and identify positive/healthier coping strategies to replace them upon discharge.  Participation Level:  Active  Participation Quality:  Appropriate  Affect:  Appropriate  Cognitive:  Appropriate  Insight:  Supportive  Engagement in Group:  Supportive  Additional Comments:  Psychoeducational group-life skills   Valente David 05/05/2013,12:08 PM

## 2013-05-05 NOTE — Progress Notes (Signed)
The focus of this group is to help patients review their daily goal of treatment and discuss progress on daily workbooks. Pt attended the evening group session and responded to all discussion prompts from the Writer. Pt reported having a good day on the unit, the best day here yet. Pt cited "interesting groups and good discussions" as to the reason why today was so good. Pt shared that he was eager to discharge home and that he had no additional needs from Nursing Staff this evening. Pt's affect was appropriate.

## 2013-05-05 NOTE — [facility] Group Notes (Signed)
[facility] Group Notes:  (Clinical Social Work)  05/05/2013   3:00-4:00PM  Summary of Progress/Problems:   The main focus of today's process group was to   identify the patient's current support system and decide on other supports that can be put in place.  The picture on workbook was used to discuss why additional supports are needed, and a hand-out was distributed with four definitions/levels of support, then used to talk about how patients have given and received all different kinds of support.  An emphasis was placed on using counselor, doctor, therapy groups, 12-step groups, and problem-specific support groups to expand supports.  The patient identified one additional support as being (1) 2 support groups that are in his home area of [place], and (2) a willingness to reach back out to family and friends in order not to overburden people he has been relying on so heavily/burning out.  Type of Therapy:  Process Group  Participation Level:  Active  Participation Quality:  Attentive and Sharing  Affect:  Blunted  Cognitive:  Appropriate and Oriented  Insight:  Engaged  Engagement in Therapy:  Engaged  Modes of Intervention:  Education,  Support and [employer], LCSW 05/05/2013, 4:32 PM

## 2013-05-05 NOTE — Progress Notes (Signed)
Pt resting in bed with eyes closed. Breathing even and unlabored. Q15 min safety checks maintained. Will continue to monitor pt.

## 2013-05-05 NOTE — Progress Notes (Signed)
Patient ID: Devin Ellison, male   DOB: October 22, 1955, 57 y.o.   MRN: [id number] D)  Has been out and about on the hall this evening, attended group, participating and interacting with staff and peers appropriately.  Was on the phone, went to bed fairly early, didn't come to med window for any prns.  Currently resting with eyes closed, resp reg, unlabored, appears to be sleeping. A) Will continue to monitor for safety, continue POC R)  Safety maintained.

## 2013-05-05 NOTE — Progress Notes (Signed)
[facility] MD Progress Note  05/05/2013 12:21 PM JAYRO MCMATH  MRN:  [id number] Subjective:  Patient denies depression, 2/10 anxiety, denies suicidal/homicidal ideations, sleep "good", appetite "perfect"--eating three meals daily with snacks.  He spoke to his family last night, children-2 sisters-wife-and girlfriend and everyone is surprised that he sounds so good.  Rolf has enjoyed the week-end groups and grateful that he stayed the week-end but would like to discharge tomorrow.  He does appear grandiose at times when talking about his wife (not been together for two years) and how they are best friends and he does not want their relationship to end, together since they were 14.  When his girlfriend is mentioned, she is the love of his life and his role model for coping skills that he is learning in groups this week-end, and how he hopes to influence her to return to their relationship.  Less irritable today, yesterday he was angry he was still in the hospital, today is grateful.  Hypomanic behaviors at times noted.  Diagnosis:   DSM5:  Depressive Disorders:  Major Depressive Disorder - Severe (296.23)  Axis I: Anxiety Disorder NOS and Major Depression, Recurrent severe Axis II: Deferred Axis III:  Past Medical History  Diagnosis Date  . Depression    Axis IV: other psychosocial or environmental problems, problems related to social environment and problems with primary support group Axis V: 41-50 serious symptoms  ADL's:  Intact  Sleep: Good  Appetite:  Good  Suicidal Ideation:  Denies Homicidal Ideation:  Denies  Psychiatric Specialty Exam: Review of Systems  Constitutional: Negative.   HENT: Negative.   Eyes: Negative.   Respiratory: Negative.   Cardiovascular: Negative.   Gastrointestinal: Negative.   Genitourinary: Negative.   Musculoskeletal: Negative.   Skin: Negative.   Neurological: Negative.   Endo/Heme/Allergies: Negative.   Psychiatric/Behavioral: The patient is  nervous/anxious.     Blood pressure 141/88, pulse 75, temperature 98.2 F (36.8 C), temperature source Oral, resp. rate 18, height 5' 10.87" (1.8 m), weight 84.369 kg (186 lb).Body mass index is 26.04 kg/(m^2).  General Appearance: Casual  Eye Contact::  Fair  Speech:  Normal Rate  Volume:  Normal  Mood:  Anxious  Affect:  Congruent  Thought Process:  Coherent  Orientation:  Full (Time, Place, and Person)  Thought Content:  WDL  Suicidal Thoughts:  No  Homicidal Thoughts:  No  Memory:  Immediate;   Fair Recent;   Fair Remote;   Fair  Judgement:  Fair  Insight:  Lacking  Psychomotor Activity:  Normal  Concentration:  Fair  Recall:  Fair  Akathisia:  No  Handed:  Right  AIMS (if indicated):     Assets:  Communication Skills Physical Health Resilience Social Support  Sleep:  Number of Hours: 5.5   Current Medications: Current Facility-Administered Medications  Medication Dose Route Frequency Provider Last Rate Last Dose  . acetaminophen (TYLENOL) tablet 650 mg  650 mg Oral Q6H PRN Shuvon Rankin, NP      . alum & mag hydroxide-simeth (MAALOX/MYLANTA) 200-200-20 MG/5ML suspension 30 mL  30 mL Oral Q4H PRN Shuvon Rankin, NP      . ARIPiprazole (ABILIFY) tablet 10 mg  10 mg Oral Daily Nehemiah Settle, MD   10 mg at 05/05/13 0756  . lamoTRIgine (LAMICTAL) tablet 200 mg  200 mg Oral Daily Shuvon Rankin, NP   200 mg at 05/05/13 0756  . magnesium hydroxide (MILK OF MAGNESIA) suspension 30 mL  30 mL Oral Daily PRN  Shuvon Rankin, NP      . nicotine (NICODERM CQ - dosed in mg/24 hours) patch 21 mg  21 mg Transdermal Daily Kerry Hough, PA-C   21 mg at 05/05/13 0756  . traZODone (DESYREL) tablet 50 mg  50 mg Oral QHS,MR X 1 Kerry Hough, PA-C   50 mg at 05/03/13 2236    Lab Results: No results found for this or any previous visit (from the past 48 hour(s)).  Physical Findings: AIMS: Facial and Oral Movements Muscles of Facial Expression: None, normal Lips and  Perioral Area: None, normal Jaw: None, normal Tongue: None, normal,Extremity Movements Upper (arms, wrists, hands, fingers): None, normal Lower (legs, knees, ankles, toes): None, normal, Trunk Movements Neck, shoulders, hips: None, normal, Overall Severity Severity of abnormal movements (highest score from questions above): None, normal Incapacitation due to abnormal movements: None, normal Patient's awareness of abnormal movements (rate only patient's report): No Awareness, Dental Status Current problems with teeth and/or dentures?: No Does patient usually wear dentures?: No  CIWA:  CIWA-Ar Total: 0 COWS:  COWS Total Score: 0  Treatment Plan Summary: Daily contact with patient to assess and evaluate symptoms and progress in treatment Medication management  Plan:  Review of chart, vital signs, medications, and notes. 1-Individual and group therapy 2-Medication management for depression and anxiety:  Medications reviewed with the patient and no untoward effects, no changes made 3-Coping skills for depression and anxiety 4-Continue crisis stabilization and management 5-Address health issues--monitoring vital signs, stable 6-Treatment plan in progress to prevent relapse of depression and anxiety  Medical Decision Making Problem Points:  Established problem, stable/improving (1) and Review of psycho-social stressors (1) Data Points:  Review of medication regiment & side effects (2)  I certify that inpatient services furnished can reasonably be expected to improve the patient's condition.   Nanine Means, PMH-NP 05/05/2013, 12:21 PM

## 2013-05-05 NOTE — [facility] Group Notes (Signed)
Adult Psychoeducational Group Note  Date:  05/05/2013 Time:  9:11 PM  Group Topic/Focus:  Wrap-Up Group:   The focus of this group is to help patients review their daily goal of treatment and discuss progress on daily workbooks.  Participation Level:  Active  Participation Quality:  Appropriate  Affect:  Appropriate  Cognitive:  Appropriate  Insight: Appropriate  Engagement in Group:  Engaged  Modes of Intervention:  Discussion  Additional Comments:  Mart said that he had the best weekend of his life in dealing with his depression.  He stated he learned a lot about his depression and coping skills to deal with the depression.  He also expressed that he has a plan in place for his discharge tomorrow and he is determined to see it through and not let anyone stop him from succeeding.   Caroll Rancher A 05/05/2013, 9:11 PM

## 2013-05-06 MED ORDER — ARIPIPRAZOLE 10 MG PO TABS: 10.0000 mg | ORAL_TABLET | Freq: Every day | ORAL | Status: DC

## 2013-05-06 MED ORDER — TRAZODONE HCL 100 MG PO TABS
50.0000 mg | ORAL_TABLET | Freq: Every day | ORAL | Status: DC
  Filled 2013-05-06: qty 4

## 2013-05-06 MED ORDER — TRAZODONE HCL 50 MG PO TABS: ORAL_TABLET | ORAL | Status: DC

## 2013-05-06 MED ORDER — LAMOTRIGINE 200 MG PO TABS: 200.0000 mg | ORAL_TABLET | Freq: Every day | ORAL | Status: DC

## 2013-05-06 NOTE — Progress Notes (Signed)
[facility] Adult Case Management Discharge Plan :  Will you be returning to the same living situation after discharge: Yes,  return to home in [place] At discharge, do you have transportation home?:No.  Patient denies, LCSW to assess with patient and arrange. Do you have the ability to pay for your medications:Yes,  no barriers  Release of information consent forms completed and in the chart;  Patient's signature needed at discharge.  Patient to Follow up at: Follow-up Information   Follow up with [facility] for [facility] On 05/09/2013. (Appointment scheduled at 1:00 pm with Sidonie Dickens for therapy)    Contact information:   [address] [place] [address] Phone: [phone] Fax: [phone]       Follow up with [facility] On 05/14/2013. (Appointment scheduled at 9:40 am with Garlan Fillers, NP for medication management)    Contact information:   [address] [place], [place] [address] Phone: [phone]  Fax: [phone]      Patient denies SI/HI:   Yes,  no barriers.    Safety Planning and Suicide Prevention discussed:  Yes,  completed, see SI note  Patient signed 72 hour demand for DC. Patient to DC today as no evidence of safety or problems.  Devin Ellison, Devin Ellison 05/06/2013, 9:51 AM

## 2013-05-06 NOTE — Progress Notes (Signed)
Discharge note: Pt denies SI/HI/depression. Pt stated that he developed coping skills to help him deal with depression. Pt received verbal and written discharge instructions. Pt agreed to f/u appt and med regimen. Pt received sample meds and prescriptions. Pt safely left [facility] with his girlfriend.

## 2013-05-06 NOTE — Progress Notes (Signed)
Adult Psychoeducational Group Note  Date:  05/06/2013 Time:  10:00am Group Topic/Focus:  Self Care:   The focus of this group is to help patients understand the importance of self-care in order to improve or restore emotional, physical, spiritual, interpersonal, and financial health.  Participation Level:  Active  Participation Quality:  Appropriate and Attentive  Affect:  Appropriate  Cognitive:  Alert and Appropriate  Insight: Appropriate  Engagement in Group:  Engaged  Modes of Intervention:  Discussion and Education  Additional Comments:  Pt attended and participated in group. When asked what self care meant to him he stated that enjoying everyday life and not stressing about things we have no control over.  Shelly Bombard D 05/06/2013, 11:10 AM

## 2013-05-06 NOTE — [facility] Suicide Risk Assessment (Signed)
Suicide Risk Assessment  Discharge Assessment     Demographic Factors:  Male, Caucasian and Unemployed  Mental Status Per Nursing Assessment::   On Admission:  Suicidal ideation indicated by patient;Self-harm thoughts  Current Mental Status by Physician: NA  Loss Factors: Financial problems/change in socioeconomic status  Historical Factors: Prior suicide attempts and Impulsivity  Risk Reduction Factors:   Responsible for children under 59 years of age, Sense of responsibility to family, Religious beliefs about death, Living with another person, especially a relative, Positive social support, Positive therapeutic relationship and Positive coping skills or problem solving skills  Continued Clinical Symptoms:  Bipolar Disorder:   Mixed State  Cognitive Features That Contribute To Risk:  Polarized thinking    Suicide Risk:  Minimal: No identifiable suicidal ideation.  Patients presenting with no risk factors but with morbid ruminations; may be classified as minimal risk based on the severity of the depressive symptoms  Discharge Diagnoses:   AXIS I:  Bipolar, mixed AXIS II:  Deferred AXIS III:   Past Medical History  Diagnosis Date  . Depression    AXIS IV:  economic problems, occupational problems, other psychosocial or environmental problems, problems related to social environment and problems with primary support group AXIS V:  61-70 mild symptoms  Plan Of Care/Follow-up recommendations:  Activity:  as tolerated Diet:  Regular  Is patient on multiple antipsychotic therapies at discharge:  No   Has Patient had three or more failed trials of antipsychotic monotherapy by history:  No  Recommended Plan for Multiple Antipsychotic Therapies: NA  Nehemiah Settle., MD 05/06/2013, 12:39 PM

## 2013-05-06 NOTE — Discharge Summary (Signed)
Physician Discharge Summary Note  Patient:  Devin Ellison is an 57 y.o., male MRN:  [id number] DOB:  05-02-1956 Patient phone:  [phone] (home)  Patient address:   [phone] [place] [address],   Date of Admission:  04/30/2013 Date of Discharge: 05/06/2013  Reason for Admission:  Suicide attempt  Discharge Diagnoses: Principal Problem:   MDD (major depressive disorder), single episode  ROS  DSM5:  Axis Diagnosis:  Depressive Disorders: Bipolar disorder  AXIS I: Bipolar, mixed  AXIS II: Borderline Personality Dis. and Cluster B Traits  AXIS III:  Past Medical History   Diagnosis  Date   .  Depression     AXIS IV: problems with primary support group  AXIS V: 51-60 moderate symptoms   Level of Care:  OP  Hospital Course:       Devin Ellison was admitted voluntarily after he called an ambulance stating he had taken an overdose of Advil in a suicide attempt, after his girlfriend broke up with him. He reported that he has a history of bipolar disorder for which he sees Dr. Gerre Scull about every 6 months. He was taking Lamictal as his only medication. He later admitted that the Advil overdose had been a lie.      Upon admission to the adult unit Devin Ellison was evaluated and medication management was initiated. He also reported that he did have a problem with "lying quite a bit" stating he often told "half truths" or had "lies of omission." He was encouraged to participate in unit programming and to attend groups.  On Friday 8-29 Devin Ellison signed a 72 hour discharge request stating "his needs were not being met."         He continued to take his medication through the weekend and reported no SI/HI or AVH. By the day of discharge Devin Ellison was in much improved condition and felt stable to go home with plans to follow up as noted below.  He was adamant that his 72 hour discharge request be honored to the minute or he would be contacting his attorney for violating his rights.  He was discharged out as written. Consults:  None  Significant Diagnostic Studies:  None  Discharge Vitals:   Blood pressure 142/88, pulse 86, temperature 97.6 F (36.4 C), temperature source Oral, resp. rate 20, height 5' 10.87" (1.8 m), weight 84.369 kg (186 lb). Body mass index is 26.04 kg/(m^2). Lab Results:   No results found for this or any previous visit (from the past 72 hour(s)).  Physical Findings: AIMS: Facial and Oral Movements Muscles of Facial Expression: None, normal Lips and Perioral Area: None, normal Jaw: None, normal Tongue: None, normal,Extremity Movements Upper (arms, wrists, hands, fingers): None, normal Lower (legs, knees, ankles, toes): None, normal, Trunk Movements Neck, shoulders, hips: None, normal, Overall Severity Severity of abnormal movements (highest score from questions above): None, normal Incapacitation due to abnormal movements: None, normal Patient's awareness of abnormal movements (rate only patient's report): No Awareness, Dental Status Current problems with teeth and/or dentures?: No Does patient usually wear dentures?: No  CIWA:  CIWA-Ar Total: 0 COWS:  COWS Total Score: 0  Psychiatric Specialty Exam: See Psychiatric Specialty Exam and Suicide Risk Assessment completed by Attending Physician prior to discharge.  Discharge destination:  Home  Is patient on multiple antipsychotic therapies at discharge:  No   Has Patient had three or more failed trials of antipsychotic monotherapy by history:  No  Recommended Plan for Multiple Antipsychotic Therapies: NA  Discharge Orders   Future Orders Complete By Expires   Diet - low sodium heart healthy  As directed    Discharge instructions  As directed    Comments:     Take all of your medications as directed. Be sure to keep all of your follow up appointments.  If you are unable to keep your follow up appointment, call your Doctor's office to let them know, and reschedule.  Make sure that you  have enough medication to last until your appointment. Be sure to get plenty of rest. Going to bed at the same time each night will help. Try to avoid sleeping during the day.  Increase your activity as tolerated. Regular exercise will help you to sleep better and improve your mental health. Eating a heart healthy diet is recommended. Try to avoid salty or fried foods. Be sure to avoid all alcohol and illegal drugs.   Increase activity slowly  As directed       Medication List       Indication   ARIPiprazole 10 MG tablet  Commonly known as:  ABILIFY  Take 1 tablet (10 mg total) by mouth daily. For Bipolar Mood disorder.   Indication:  Manic-Depression     lamoTRIgine 200 MG tablet  Commonly known as:  LAMICTAL  Take 1 tablet (200 mg total) by mouth daily. For mood disorder and depression,   Indication:  Manic-Depression     traZODone 50 MG tablet  Commonly known as:  DESYREL  Take one tablet at bedtime for insomnia.   Indication:  Insomnia           Follow-up Information   Follow up with [facility] On 05/09/2013. (Appointment scheduled at 1:00 pm with Sidonie Dickens for therapy)    Contact information:   [address] [facility] [address] [place] [address] Phone: [phone] Fax: [phone]       Follow up with [facility] On 05/14/2013. (Appointment scheduled at 9:40 am with Garlan Fillers, NP for medication management)    Contact information:   [address] [place], [place] [address] Phone: [phone]  Fax: [phone]      Follow-up recommendations:   Activities: Resume activity as tolerated. Diet: Heart healthy low sodium diet Tests: Follow up testing will be determined by your out patient provider. Comments:    Total Discharge Time:  Greater than 30 minutes.  Signed: MASHBURN,NEIL 05/06/2013, 10:15 AM  Patient is seen face to face evaluation, case discussed with treatment team and physician extender. Reviewed the  information documented and agree with the treatment plan.  Frankye Schwegel,JANARDHAHA R. 05/06/2013 2:17 PM

## 2013-05-06 NOTE — Progress Notes (Signed)
Writer informed pt that he is waiting for his sample meds and SRA note to be completed and then he will be d/c home. Pt response, "you're the best".

## 2013-05-09 NOTE — Progress Notes (Signed)
Patient Discharge Instructions:  After Visit Summary (AVS):   Faxed to:  04/08/13 Discharge Summary Note:   Faxed to:  04/08/13 Psychiatric Admission Assessment Note:   Faxed to:  04/08/13 Suicide Risk Assessment - Discharge Assessment:   Faxed to:  04/08/13 Faxed/Sent to the Next Level Care provider:  04/08/13 Faxed to [facility] @ [phone] Faxed to [facility] @ [phone]  Jerelene Redden, 05/09/2013, 3:22 PM

## 2014-09-09 ENCOUNTER — Encounter (HOSPITAL_COMMUNITY): Payer: Self-pay

## 2014-09-09 ENCOUNTER — Emergency Department (HOSPITAL_COMMUNITY)
Admission: EM | Admit: 2014-09-09 | Discharge: 2014-09-09 | Disposition: A | Discharge status: Home / Self Care | Payer: Worker's Compensation | Attending: Emergency Medicine | Admitting: Emergency Medicine

## 2014-09-09 DIAGNOSIS — Y9389 Activity, other specified: Secondary | ICD-10-CM | POA: Diagnosis not present

## 2014-09-09 DIAGNOSIS — Z79899 Other long term (current) drug therapy: Secondary | ICD-10-CM | POA: Diagnosis not present

## 2014-09-09 DIAGNOSIS — W260XXA Contact with knife, initial encounter: Secondary | ICD-10-CM | POA: Insufficient documentation

## 2014-09-09 DIAGNOSIS — F319 Bipolar disorder, unspecified: Secondary | ICD-10-CM | POA: Diagnosis not present

## 2014-09-09 DIAGNOSIS — S61011A Laceration without foreign body of right thumb without damage to nail, initial encounter: Secondary | ICD-10-CM | POA: Diagnosis not present

## 2014-09-09 DIAGNOSIS — Y9289 Other specified places as the place of occurrence of the external cause: Secondary | ICD-10-CM | POA: Insufficient documentation

## 2014-09-09 DIAGNOSIS — Y998 Other external cause status: Secondary | ICD-10-CM | POA: Insufficient documentation

## 2014-09-09 DIAGNOSIS — Z87891 Personal history of nicotine dependence: Secondary | ICD-10-CM | POA: Insufficient documentation

## 2014-09-09 MED ORDER — LIDOCAINE HCL (PF) 1 % IJ SOLN
5.0000 mL | Freq: Once | INTRAMUSCULAR | Status: AC
  Administered 2014-09-09: 5 mL via INTRADERMAL

## 2014-09-09 MED ORDER — LIDOCAINE HCL 1 % IJ SOLN
INTRAMUSCULAR | Status: DC
Start: 2014-09-09 — End: 2014-09-09
  Filled 2014-09-09: qty 20

## 2014-09-09 MED ORDER — HYDROCODONE-ACETAMINOPHEN 5-325 MG PO TABS: 1.0000 | ORAL_TABLET | Freq: Four times a day (QID) | ORAL | Status: DC | PRN

## 2014-09-09 MED ORDER — LIDOCAINE HCL (PF) 1 % IJ SOLN
5.0000 mL | Freq: Once | INTRAMUSCULAR | Status: AC
  Administered 2014-09-09: 5 mL
  Filled 2014-09-09: qty 5

## 2014-09-09 NOTE — Discharge Instructions (Signed)
1. Medications: Tylenol or ibuprofen for pain, usual home medications 2. Treatment: ice for swelling, keep wound clean with warm soap and water and keep bandage dry, do not submerge in water for 24 hours 3. Follow Up: Please see Dr. Fredna Dow tomorrow for further evaluation of your hand. Return to the emergency department for increased redness, drainage of pus from the wound   WOUND CARE  Keep area clean and dry for 24 hours. Do not remove bandage, if applied.  After 24 hours, remove bandage and wash wound gently with mild soap and warm water. Reapply a new bandage after cleaning wound, if directed.   Continue daily cleansing with soap and water until stitches/staples are removed.  Do not apply any ointments or creams to the wound while stitches/staples are in place, as this may cause delayed healing. Return if you experience any of the following signs of infection: Swelling, redness, pus drainage, streaking, fever >101.0 F  Return if you experience excessive bleeding that does not stop after 15-20 minutes of constant, firm pressure.

## 2014-09-09 NOTE — ED Provider Notes (Signed)
CSN: [id number]     Arrival date & time 09/09/14  1516 History   First MD Initiated Contact with Patient 09/09/14 1539     Chief Complaint  Patient presents with  . Extremity Laceration     (Consider location/radiation/quality/duration/timing/severity/associated sxs/prior Treatment) The history is provided by the patient and medical records. No language interpreter was used.     Devin Ellison is a 59 y.o. male  with a hx of depression, bipolar presents to the Emergency Department complaining of acute laceration to the right thumb onset 3pm today. Associated symptoms include pain to the right thumb.  NO treatments PTA.  Nothing makes it better and nothing makes it worse.  Pt denies fever, chills, nausea, vomiting, deceased sensation.      Past Medical History  Diagnosis Date  . Depression    Past Surgical History  Procedure Laterality Date  . Colon surgery    . Ankle surgery    . Hand surgery     History reviewed. No pertinent family history. History  Substance Use Topics  . Smoking status: Former Smoker -- 1.00 packs/day    Types: Cigarettes  . Smokeless tobacco: Not on file  . Alcohol Use: Yes     Comment: social    Review of Systems  Constitutional: Negative for fever.  Gastrointestinal: Negative for nausea and vomiting.  Skin: Positive for wound.  Allergic/Immunologic: Negative for immunocompromised state.  Neurological: Negative for weakness and numbness.  Hematological: Does not bruise/bleed easily.  Psychiatric/Behavioral: The patient is not nervous/anxious.       Allergies  Review of patient's allergies indicates no known allergies.  Home Medications   Prior to Admission medications   Medication Sig Start Date End Date Taking? Authorizing Provider  ARIPiprazole (ABILIFY) 10 MG tablet Take 1 tablet (10 mg total) by mouth daily. For Bipolar Mood disorder. 05/06/13   Nena Polio, PA-C  lamoTRIgine (LAMICTAL) 200 MG tablet Take 1 tablet (200 mg total) by  mouth daily. For mood disorder and depression, 05/06/13   Nena Polio, PA-C  traZODone (DESYREL) 50 MG tablet Take one tablet at bedtime for insomnia. 05/06/13   Nena Polio, PA-C   BP 157/92 mmHg  Pulse 71  Temp(Src) 98.9 F (37.2 C) (Oral)  Resp 18  SpO2 99% Physical Exam  Constitutional: He is oriented to person, place, and time. He appears well-developed and well-nourished. No distress.  HENT:  Head: Normocephalic and atraumatic.  Eyes: Conjunctivae are normal. No scleral icterus.  Neck: Normal range of motion.  Cardiovascular: Normal rate, regular rhythm, normal heart sounds and intact distal pulses.   No murmur heard. Capillary refill < 3 sec  Pulmonary/Chest: Effort normal and breath sounds normal. No respiratory distress.  Musculoskeletal: Normal range of motion. He exhibits no edema.  ROM: decreased extension of the right thumb, full flexion  Neurological: He is alert and oriented to person, place, and time.  Sensation: intact to dull and sharp Strength: 2/5 with extension of the right thumb; 5/5 with flexion of the right thumb  Skin: Skin is warm and dry. He is not diaphoretic.  Psychiatric: He has a normal mood and affect.  Nursing note and vitals reviewed.   ED Course  LACERATION REPAIR Date/Time: 09/09/2014 4:26 PM Performed by: Abigail Butts Authorized by: Abigail Butts Consent: Verbal consent obtained. Risks and benefits: risks, benefits and alternatives were discussed Consent given by: patient Patient understanding: patient states understanding of the procedure being performed Patient consent: the patient's understanding of the procedure matches  consent given Procedure consent: procedure consent matches procedure scheduled Relevant documents: relevant documents present and verified Site marked: the operative site was marked Patient identity confirmed: arm band and verbally with patient Time out: Immediately prior to procedure a "time out" was  called to verify the correct patient, procedure, equipment, support staff and site/side marked as required. Body area: upper extremity Location details: right thumb Laceration length: 3 cm Foreign bodies: no foreign bodies Tendon involvement: none Nerve involvement: none Vascular damage: no Anesthesia: digital block Local anesthetic: lidocaine 1% without epinephrine Anesthetic total: 5 ml Patient sedated: no Preparation: Patient was prepped and draped in the usual sterile fashion. Irrigation solution: saline Irrigation method: syringe Amount of cleaning: standard Wound skin closure material used: 5-0 Vicryl Rapide. Number of sutures: 3 Technique: simple Approximation: close Approximation difficulty: simple Dressing: 4x4 sterile gauze Patient tolerance: Patient tolerated the procedure well with no immediate complications   (including critical care time) Labs Review Labs Reviewed - No data to display  Imaging Review No results found.   EKG Interpretation None      MDM   Final diagnoses:  Thumb laceration, right, initial encounter   Devin Ellison presents with laceration to the right thumb with evidence of extensor tendon laceration. Will consult with hand and plan for skin repair.  4:37 PM  Discussed with Dr. Fredna Dow.  Will close skin and pt will be seen tomorrow.    5:34 PM Tdap UTD.  Pressure irrigation performed. Laceration occurred < 8 hours prior to repair which was well tolerated. Pt has no co morbidities to effect normal wound healing. Discussed suture home care w pt and answered questions. Pt placed in full extension with thumb spica. Pt is hemodynamically stable w no complaints prior to dc.  Patient to see Dr. Fredna Dow tomorrow for further evaluation.  I have personally reviewed patient's vitals, nursing note and any pertinent labs or imaging.  I performed an focused physical exam; undressed when appropriate .    It has been determined that no acute conditions  requiring further emergency intervention are present at this time. The patient/guardian have been advised of the diagnosis and plan. I reviewed any labs and imaging including any potential incidental findings. We have discussed signs and symptoms that warrant return to the ED and they are listed in the discharge instructions.    Vital signs are stable at discharge.   BP 157/92 mmHg  Pulse 71  Temp(Src) 98.9 F (37.2 C) (Oral)  Resp 18  SpO2 99%         Abigail Butts, PA-C 09/09/14 [place], MD 09/09/14 [phone]

## 2014-09-09 NOTE — ED Notes (Signed)
Pt sliced rt thumb on knife around 3 pm.  Bleeding controlled.  Tetanus up to date.

## 2014-11-05 ENCOUNTER — Telehealth: Payer: Self-pay

## 2014-11-05 NOTE — Telephone Encounter (Signed)
Patient requesting to See Dr Laney Pastor to evaluate his blood pressure. He stated he has not seen Dr Laney Pastor for 5 years because he was living in [place]. He stated he was good friends with Dr Laney Pastor and feels he would ok to see him. He would be considered a new patient. Since Dr Laney Pastor is not accepting new patients the schedule will not allow me to make him an appointment at his request. Patients call back number is [phone]

## 2014-11-07 NOTE — Telephone Encounter (Signed)
Tell him the reason for closing is planned retirement next year---I could schedule him with "my Nurse practioner " in clinic with me to get him back in system here

## 2014-11-09 ENCOUNTER — Ambulatory Visit (INDEPENDENT_AMBULATORY_CARE_PROVIDER_SITE_OTHER): Payer: BLUE CROSS/BLUE SHIELD | Admitting: Family Medicine

## 2014-11-09 VITALS — BP 168/100 | HR 72 | Temp 97.9°F | Resp 16 | Ht 72.5 in | Wt 210.5 lb

## 2014-11-09 DIAGNOSIS — I10 Essential (primary) hypertension: Secondary | ICD-10-CM

## 2014-11-09 DIAGNOSIS — N5201 Erectile dysfunction due to arterial insufficiency: Secondary | ICD-10-CM | POA: Diagnosis not present

## 2014-11-09 LAB — LIPID PANEL
Cholesterol: 155 mg/dL (ref 0–200)
HDL: 67 mg/dL (ref >=40)
LDL Cholesterol: 74 mg/dL (ref 0–99)
Total CHOL/HDL Ratio: 2.3 Ratio
Triglycerides: 70 mg/dL (ref <150)
VLDL: 14 mg/dL (ref 0–40)

## 2014-11-09 LAB — COMPREHENSIVE METABOLIC PANEL
ALT: 10 U/L (ref 0–53)
AST: 15 U/L (ref 0–37)
Albumin: 4.1 g/dL (ref 3.5–5.2)
Alkaline Phosphatase: 68 U/L (ref 39–117)
BUN: 13 mg/dL (ref 6–23)
CO2: 23 mEq/L (ref 19–32)
Calcium: 8.9 mg/dL (ref 8.4–10.5)
Chloride: 106 mEq/L (ref 96–112)
Creat: 0.92 mg/dL (ref 0.50–1.35)
Glucose, Bld: 100 mg/dL — ABNORMAL HIGH (ref 70–99)
Potassium: 3.9 mEq/L (ref 3.5–5.3)
Sodium: 137 mEq/L (ref 135–145)
Total Bilirubin: 0.7 mg/dL (ref 0.2–1.2)
Total Protein: 6.5 g/dL (ref 6.0–8.3)

## 2014-11-09 LAB — T4, FREE: Free T4: 0.93 ng/dL (ref 0.80–1.80)

## 2014-11-09 MED ORDER — AMLODIPINE BESYLATE 5 MG PO TABS: 5.0000 mg | ORAL_TABLET | Freq: Every day | ORAL | Status: DC

## 2014-11-09 MED ORDER — SILDENAFIL CITRATE 20 MG PO TABS: 20.0000 mg | ORAL_TABLET | Freq: Every day | ORAL | Status: DC | PRN

## 2014-11-09 NOTE — Addendum Note (Signed)
Addended by: Robyn Haber on: 11/09/2014 03:31 PM   Modules accepted: Level of Service

## 2014-11-09 NOTE — Progress Notes (Signed)
° °  Subjective:    Patient ID: Devin Ellison, male    DOB: Dec 19, 1955, 59 y.o.   MRN: [id number] This chart was scribed for Robyn Haber, MD by Zola Button, Medical Scribe. This patient was seen in Room 3 and the patient's care was started at 2:40 PM.   HPI HPI Comments: Devin Ellison is a 59 y.o. male who presents to the [facility] complaining of elevated blood pressure. Patient notes that his blood pressure was measured at 186/99 5 days ago. He also brings in a list of blood pressures measured in the past, listed below. He normally does 45-60 minutes of cardio exercise daily on the elliptical and stationary bike; however, he has not exercised in the past 8 weeks because he injured a tendon in his right thumb. Patient denies HA or any other symptoms. He also denies any FMHx of hypertension. He notes that he has not taken ASA since 2010. Patient is a former smoker, but does not smoke currently. He is on lithium, Lamictal and Abilify.  Patient used to Adult nurse. He has been in [place] for the past 4-5 years. His mother is at [place], and she has been having some cognitive issues.  Previous blood pressure readings: 12/04/13: 154/94 01/07/14: 156/88 04/10/14: 184/84 07/08/14: 164/103   Review of Systems  Neurological: Positive for headaches.       Objective:   Physical Exam CONSTITUTIONAL: Well developed/well nourished HEAD: Normocephalic/atraumatic EYES: EOM/PERRL ENMT: Mucous membranes moist NECK: supple no meningeal signs, no bruit SPINE: entire spine nontender CV: S1/S2 noted, no murmurs/rubs/gallops noted LUNGS: Lungs are clear to auscultation bilaterally, no apparent distress ABDOMEN: soft, nontender, no rebound or guarding, no bruit GU: no cva tenderness NEURO: Pt is awake/alert, moves all extremitiesx4 EXTREMITIES: pulses normal, full ROM, no edema SKIN: warm, color normal PSYCH: no abnormalities of mood noted  EKG:  Normal sinus rhythm      Assessment & Plan:   This chart was scribed in my presence and reviewed by me personally.    ICD-9-CM ICD-10-CM   1. Essential hypertension 401.9 I10 Comprehensive metabolic panel     Lipid panel     EKG 12-Lead   Follow up 4 - 6 weeks  Signed, Robyn Haber, MD

## 2014-11-09 NOTE — Patient Instructions (Signed)

## 2014-11-13 NOTE — Telephone Encounter (Signed)
Spoke with pt, advised message from Dr. Laney Pastor. Pt understood and made an appt with Dr. Joseph Art.

## 2014-11-20 ENCOUNTER — Other Ambulatory Visit: Payer: Self-pay | Admitting: Family Medicine

## 2014-11-20 MED ORDER — SILDENAFIL CITRATE 100 MG PO TABS
50.0000 mg | ORAL_TABLET | Freq: Every day | ORAL | Status: DC | PRN
Start: 2014-11-20 — End: 2016-01-15

## 2014-12-11 ENCOUNTER — Encounter: Payer: Self-pay | Admitting: Family Medicine

## 2014-12-11 ENCOUNTER — Ambulatory Visit (INDEPENDENT_AMBULATORY_CARE_PROVIDER_SITE_OTHER): Payer: BLUE CROSS/BLUE SHIELD | Admitting: Family Medicine

## 2014-12-11 VITALS — BP 177/106 | HR 62 | Temp 98.1°F | Resp 16 | Ht 73.0 in | Wt 210.0 lb

## 2014-12-11 DIAGNOSIS — I10 Essential (primary) hypertension: Secondary | ICD-10-CM | POA: Diagnosis not present

## 2014-12-11 MED ORDER — LOSARTAN POTASSIUM 100 MG PO TABS: 100.0000 mg | ORAL_TABLET | Freq: Every day | ORAL | Status: DC

## 2014-12-11 NOTE — Patient Instructions (Signed)

## 2014-12-11 NOTE — Progress Notes (Signed)
This chart was scribed for Robyn Haber, MD by Edison Simon, ED Scribe. This patient was seen in room 25  Patient ID: Devin Ellison MRN: [id number], DOB: [birth year], 59 y.o. Date of Encounter: 12/11/2014, 12:03 PM  Primary Physician: Leandrew Koyanagi, MD  Chief Complaint:  Chief Complaint  Patient presents with   follow up blood pressure    HPI: 59 y.o. year old male with history below presents for follow up for blood pressure. He was seen by me on 11/09/2014. Per previous note: "Patient notes that his blood pressure was measured at 186/99 5 days ago. He also brings in a list of blood pressures measured in the past, listed below. He normally does 45-60 minutes of cardio exercise daily on the elliptical and stationary bike; however, he has not exercised in the past 8 weeks because he injured a tendon in his right thumb. Patient denies HA or any other symptoms. He also denies any FMHx of hypertension. He notes that he has not taken ASA since 2010. Patient is a former smoker, but does not smoke currently. He is on lithium, Lamictal and Abilify. Previous blood pressure readings:  12/04/13: 154/94  01/07/14: 156/88  04/10/14: 184/84  07/08/14: 164/103 Patient used to practice law. He has been in [place] for the past 4-5 years. His mother is at [place], and she has been having some cognitive issues."  Today, he states blood pressure has continued to be high, it was measured by me at 156/84. He has been checking it with an app on his phone. He states he has been compliant with Amlodipine prescription and denies any side effects. He states he has not been working out recently because his mother has been having problems. He notes he drinks a moderate amount of caffeineted soda but no coffee. He reports moderate amount of sodium in his diet and eats [place] and [place] food fairly often, but does try to eat well.  He reports recent cough and seasonal allergies in response to pollen. He has used  Sudafed for his cough.  Past Medical History  Diagnosis Date   Depression      Home Meds: Prior to Admission medications   Medication Sig Start Date End Date Taking? Authorizing Provider  amLODipine (NORVASC) 5 MG tablet Take 1 tablet (5 mg total) by mouth daily. 11/09/14   Robyn Haber, MD  ARIPiprazole (ABILIFY) 10 MG tablet Take 1 tablet (10 mg total) by mouth daily. For Bipolar Mood disorder. 05/06/13   Ruben Im, PA-C  lamoTRIgine (LAMICTAL) 200 MG tablet Take 1 tablet (200 mg total) by mouth daily. For mood disorder and depression, 05/06/13   Ruben Im, PA-C  lithium 600 MG capsule Take 600 mg by mouth daily.    Historical Provider, MD  sildenafil (VIAGRA) 100 MG tablet Take 0.5-1 tablets (50-100 mg total) by mouth daily as needed for erectile dysfunction. 11/20/14   Robyn Haber, MD    Allergies: No Known Allergies  History   Social History   Marital Status: Married    Spouse Name: N/A   Number of Children: N/A   Years of Education: N/A   Occupational History   Not on file.   Social History Main Topics   Smoking status: Former Smoker -- 1.00 packs/day    Types: Cigarettes   Smokeless tobacco: Not on file   Alcohol Use: 0.0 oz/week    0 Standard drinks or equivalent per week     Comment: social   Drug Use:  No   Sexual Activity: Not on file   Other Topics Concern   Not on file   Social History Narrative     Review of Systems: Constitutional: negative for chills, fever, night sweats, weight changes, or fatigue  HEENT: negative for vision changes, hearing loss, congestion, rhinorrhea, ST, epistaxis, or sinus pressure, positive seasonal allergies Cardiovascular: negative for chest pain or palpitations Respiratory: negative for hemoptysis, wheezing, or shortness of breath, positive cough Abdominal: negative for abdominal pain, nausea, vomiting, diarrhea, or constipation Dermatological: negative for rash Neurologic: negative for headache,  dizziness, or syncope All other systems reviewed and are otherwise negative with the exception to those above and in the HPI.   Physical Exam: Blood pressure 177/106, pulse 62, temperature 98.1 F (36.7 C), temperature source Oral, resp. rate 16, height 6\' 1"  (1.854 m), weight 210 lb (95.255 kg), SpO2 99 %., Body mass index is 27.71 kg/(m^2). General: Well developed, well nourished, in no acute distress. Head: Normocephalic, atraumatic, eyes without discharge, sclera non-icteric, nares are without discharge. Bilateral auditory canals clear, TM's are without perforation, pearly grey and translucent with reflective cone of light bilaterally. Oral cavity moist, posterior pharynx without exudate, erythema, peritonsillar abscess, or post nasal drip.  Neck: Supple. No thyromegaly. Full ROM. No lymphadenopathy. Lungs: Clear bilaterally to auscultation without wheezes, rales, or rhonchi. Breathing is unlabored. Heart: RRR with S1 S2. No murmurs, rubs, or gallops appreciated. Blood pressure measured 156/84 on left arm manually. Abdomen: Soft, non-tender, non-distended with normoactive bowel sounds. No hepatomegaly. No rebound/guarding. No obvious abdominal masses. Msk:  Strength and tone normal for age. Extremities/Skin: Warm and dry. No clubbing or cyanosis. No edema. No rashes or suspicious lesions. Neuro: Alert and oriented X 3. Moves all extremities spontaneously. Gait is normal. CNII-XII grossly in tact. Psych:  Responds to questions appropriately with a normal affect.   Labs: Results for orders placed or performed in visit on 11/09/14  Comprehensive metabolic panel  Result Value Ref Range   Sodium 137 135 - 145 mEq/L   Potassium 3.9 3.5 - 5.3 mEq/L   Chloride 106 96 - 112 mEq/L   CO2 23 19 - 32 mEq/L   Glucose, Bld 100 (H) 70 - 99 mg/dL   BUN 13 6 - 23 mg/dL   Creat 0.92 0.50 - 1.35 mg/dL   Total Bilirubin 0.7 0.2 - 1.2 mg/dL   Alkaline Phosphatase 68 39 - 117 U/L   AST 15 0 - 37 U/L    ALT 10 0 - 53 U/L   Total Protein 6.5 6.0 - 8.3 g/dL   Albumin 4.1 3.5 - 5.2 g/dL   Calcium 8.9 8.4 - 10.5 mg/dL  Lipid panel  Result Value Ref Range   Cholesterol 155 0 - 200 mg/dL   Triglycerides 70 <150 mg/dL   HDL 67 >=40 mg/dL   Total CHOL/HDL Ratio 2.3 Ratio   VLDL 14 0 - 40 mg/dL   LDL Cholesterol 74 0 - 99 mg/dL  T4, free  Result Value Ref Range   Free T4 0.93 0.80 - 1.80 ng/dL      ASSESSMENT AND PLAN:  59 y.o. year old male with  This chart was scribed in my presence and reviewed by me personally.    ICD-9-CM ICD-10-CM   1. Essential hypertension 401.9 I10 losartan (COZAAR) 100 MG tablet   Recheck 1 month Signed, Robyn Haber, MD     Signed, Robyn Haber, MD 12/11/2014 12:03 PM

## 2015-01-15 ENCOUNTER — Encounter: Payer: Self-pay | Admitting: Family Medicine

## 2015-01-15 ENCOUNTER — Ambulatory Visit (INDEPENDENT_AMBULATORY_CARE_PROVIDER_SITE_OTHER): Payer: BLUE CROSS/BLUE SHIELD | Admitting: Family Medicine

## 2015-01-15 VITALS — BP 116/60 | HR 71 | Temp 98.2°F | Resp 16 | Ht 72.0 in | Wt 213.4 lb

## 2015-01-15 DIAGNOSIS — I10 Essential (primary) hypertension: Secondary | ICD-10-CM

## 2015-01-15 MED ORDER — AMLODIPINE BESYLATE 5 MG PO TABS: 5.0000 mg | ORAL_TABLET | Freq: Every day | ORAL | Status: DC

## 2015-01-15 MED ORDER — LOSARTAN POTASSIUM 100 MG PO TABS: 100.0000 mg | ORAL_TABLET | Freq: Every day | ORAL | Status: DC

## 2015-01-15 NOTE — Patient Instructions (Signed)

## 2015-01-15 NOTE — Progress Notes (Signed)
° °  Subjective:    Patient ID: Devin Ellison, male    DOB: 1956/04/20, 59 y.o.   MRN: [id number] This chart was scribed for Robyn Haber, MD by Leandra Kern, Medical Scribe. This patient was seen in Room 26 and the patient's care was started at 1:14 PM.  HPI HPI Comments: Devin Ellison is a 59 y.o. male who presents to [facility] for a blood pressure follow-up.  Patient reports his blood pressure stats constantly being below 140/90. Patient's blood pressure today is 116/60. Patient reports making changes in his diet, he cut down his caffeine usage to one half, in addition to doing cardio work-out 45-60 min a day. Patient states that he does not feel any "different", however, still feels fatigued when stationary, but that is baseline. Patient denies light headedness, or weakness. He indicates having some anxiety issues, however, would prefer to get off his Lamictal medication. Patient does not report needing any refills.    Review of Systems  Constitutional: Positive for fatigue (when stationary ).  Gastrointestinal: Negative for nausea.  Neurological: Negative for weakness.      Objective:   Physical Exam  Constitutional: He is oriented to person, place, and time. He appears well-developed and well-nourished. No distress.  HENT:  Head: Normocephalic and atraumatic.  Right Ear: External ear normal.  Left Ear: External ear normal.  Mouth/Throat: Oropharynx is clear and moist. No oropharyngeal exudate.  Eyes: Conjunctivae and EOM are normal. Pupils are equal, round, and reactive to light.  Neck: Neck supple.  Cardiovascular: Normal rate, regular rhythm, normal heart sounds and intact distal pulses.   Pulmonary/Chest: Effort normal and breath sounds normal.  Abdominal: Soft. Bowel sounds are normal. He exhibits no mass. There is no tenderness. There is no rebound and no guarding.  Musculoskeletal: He exhibits no edema.  Lymphadenopathy:    He has no cervical  adenopathy.  Neurological: He is alert and oriented to person, place, and time. No cranial nerve deficit. Coordination normal.  Skin: Skin is warm and dry. No rash noted.  Psychiatric: He has a normal mood and affect. His behavior is normal. Judgment and thought content normal.  Nursing note and vitals reviewed.     Assessment & Plan:   This chart was scribed in my presence and reviewed by me personally.    ICD-9-CM ICD-10-CM   1. Essential hypertension 401.9 I10 amLODipine (NORVASC) 5 MG tablet     losartan (COZAAR) 100 MG tablet     Signed, Robyn Haber, MD

## 2015-08-03 ENCOUNTER — Encounter: Payer: Self-pay | Admitting: Internal Medicine

## 2015-08-28 ENCOUNTER — Encounter: Payer: Self-pay | Admitting: Internal Medicine

## 2015-08-28 ENCOUNTER — Ambulatory Visit (INDEPENDENT_AMBULATORY_CARE_PROVIDER_SITE_OTHER): Payer: BLUE CROSS/BLUE SHIELD | Admitting: Internal Medicine

## 2015-08-28 VITALS — BP 161/90 | HR 57 | Temp 97.7°F | Resp 16 | Ht 72.0 in | Wt 210.0 lb

## 2015-08-28 DIAGNOSIS — Z209 Contact with and (suspected) exposure to unspecified communicable disease: Secondary | ICD-10-CM

## 2015-08-28 MED ORDER — METRONIDAZOLE 500 MG PO TABS: 500.0000 mg | ORAL_TABLET | Freq: Two times a day (BID) | ORAL | Status: DC

## 2015-08-28 NOTE — Patient Instructions (Signed)
google Bacterial vaginitis  webMD or CDC in [place]

## 2015-08-28 NOTE — Progress Notes (Signed)
   Subjective:    Patient ID: Devin Ellison, male    DOB: [birth year], 59 y.o.   MRN: [id number]  HPI Consult for concern about current relationship Partner dx w/ BV 3 times in recent mos about 3-4 d after IC with him No other partn He's asympto She's free of other dz by testing    Review of Systems [place]    Objective:   Physical Exam BP 161/90 mmHg  Pulse 57  Temp(Src) 97.7 F (36.5 C)  Resp 16  Ht 6' (1.829 m)  Wt 210 lb (95.255 kg)  BMI 28.47 kg/m2 Worried BP at home 120/70!!! Neg exam otherwise       Assessment & Plan:  Exposure to communicable disease - Plan: Hepatitis C antibody, HIV antibody, RPR, GC/Chlamydia Probe Amp  Will notify results This should be environmental etio though if partner can't be convinced, we may try empiric flagyl for him

## 2015-08-29 LAB — RPR

## 2015-08-29 LAB — GC/CHLAMYDIA PROBE AMP
CT Probe RNA: NOT DETECTED
GC PROBE AMP APTIMA: NOT DETECTED

## 2015-08-29 LAB — HEPATITIS C ANTIBODY: HCV Ab: NEGATIVE

## 2015-08-29 LAB — HIV ANTIBODY (ROUTINE TESTING W REFLEX): HIV: NONREACTIVE

## 2015-12-08 DIAGNOSIS — F3132 Bipolar disorder, current episode depressed, moderate: Secondary | ICD-10-CM | POA: Diagnosis not present

## 2016-01-05 DIAGNOSIS — Z0000 Encounter for general adult medical examination without abnormal findings: Secondary | ICD-10-CM | POA: Diagnosis not present

## 2016-01-05 DIAGNOSIS — I10 Essential (primary) hypertension: Secondary | ICD-10-CM | POA: Diagnosis not present

## 2016-01-15 ENCOUNTER — Other Ambulatory Visit: Payer: Self-pay | Admitting: Family Medicine

## 2016-02-05 DIAGNOSIS — D125 Benign neoplasm of sigmoid colon: Secondary | ICD-10-CM | POA: Diagnosis not present

## 2016-02-05 DIAGNOSIS — D124 Benign neoplasm of descending colon: Secondary | ICD-10-CM | POA: Diagnosis not present

## 2016-02-05 DIAGNOSIS — D120 Benign neoplasm of cecum: Secondary | ICD-10-CM | POA: Diagnosis not present

## 2016-02-05 DIAGNOSIS — D126 Benign neoplasm of colon, unspecified: Secondary | ICD-10-CM | POA: Diagnosis not present

## 2016-02-05 DIAGNOSIS — D490 Neoplasm of unspecified behavior of digestive system: Secondary | ICD-10-CM | POA: Diagnosis not present

## 2016-02-05 DIAGNOSIS — Z86010 Personal history of colonic polyps: Secondary | ICD-10-CM | POA: Diagnosis not present

## 2016-02-05 DIAGNOSIS — K6389 Other specified diseases of intestine: Secondary | ICD-10-CM | POA: Diagnosis not present

## 2016-02-25 DIAGNOSIS — K639 Disease of intestine, unspecified: Secondary | ICD-10-CM | POA: Diagnosis not present

## 2016-03-03 DIAGNOSIS — F3132 Bipolar disorder, current episode depressed, moderate: Secondary | ICD-10-CM | POA: Diagnosis not present

## 2016-03-15 DIAGNOSIS — D124 Benign neoplasm of descending colon: Secondary | ICD-10-CM | POA: Diagnosis not present

## 2016-04-11 DIAGNOSIS — K635 Polyp of colon: Secondary | ICD-10-CM

## 2016-04-11 HISTORY — DX: Polyp of colon: K63.5

## 2016-06-22 DIAGNOSIS — F3132 Bipolar disorder, current episode depressed, moderate: Secondary | ICD-10-CM | POA: Diagnosis not present

## 2016-06-27 DIAGNOSIS — Z86010 Personal history of colonic polyps: Secondary | ICD-10-CM | POA: Diagnosis not present

## 2016-06-27 DIAGNOSIS — D126 Benign neoplasm of colon, unspecified: Secondary | ICD-10-CM | POA: Diagnosis not present

## 2016-06-27 DIAGNOSIS — Z1211 Encounter for screening for malignant neoplasm of colon: Secondary | ICD-10-CM | POA: Diagnosis not present

## 2016-06-27 DIAGNOSIS — D120 Benign neoplasm of cecum: Secondary | ICD-10-CM | POA: Diagnosis not present

## 2016-06-27 DIAGNOSIS — K5730 Diverticulosis of large intestine without perforation or abscess without bleeding: Secondary | ICD-10-CM | POA: Diagnosis not present

## 2016-06-27 DIAGNOSIS — D123 Benign neoplasm of transverse colon: Secondary | ICD-10-CM | POA: Diagnosis not present

## 2016-06-27 DIAGNOSIS — K635 Polyp of colon: Secondary | ICD-10-CM | POA: Diagnosis not present

## 2016-07-01 DIAGNOSIS — F3175 Bipolar disorder, in partial remission, most recent episode depressed: Secondary | ICD-10-CM | POA: Diagnosis not present

## 2016-08-05 DIAGNOSIS — R946 Abnormal results of thyroid function studies: Secondary | ICD-10-CM | POA: Diagnosis not present

## 2016-08-17 DIAGNOSIS — F3132 Bipolar disorder, current episode depressed, moderate: Secondary | ICD-10-CM | POA: Diagnosis not present

## 2016-09-06 ENCOUNTER — Ambulatory Visit (INDEPENDENT_AMBULATORY_CARE_PROVIDER_SITE_OTHER): Payer: BLUE CROSS/BLUE SHIELD | Admitting: Family Medicine

## 2016-09-06 VITALS — BP 178/98 | HR 60 | Temp 97.8°F | Resp 16 | Ht 72.0 in | Wt 216.0 lb

## 2016-09-06 DIAGNOSIS — R7989 Other specified abnormal findings of blood chemistry: Secondary | ICD-10-CM

## 2016-09-06 DIAGNOSIS — I10 Essential (primary) hypertension: Secondary | ICD-10-CM

## 2016-09-06 DIAGNOSIS — H01004 Unspecified blepharitis left upper eyelid: Secondary | ICD-10-CM

## 2016-09-06 DIAGNOSIS — R946 Abnormal results of thyroid function studies: Secondary | ICD-10-CM | POA: Diagnosis not present

## 2016-09-06 DIAGNOSIS — H1032 Unspecified acute conjunctivitis, left eye: Secondary | ICD-10-CM | POA: Diagnosis not present

## 2016-09-06 MED ORDER — AMLODIPINE BESYLATE 5 MG PO TABS: 5.0000 mg | ORAL_TABLET | Freq: Every day | ORAL | 3 refills | Status: DC

## 2016-09-06 MED ORDER — LOSARTAN POTASSIUM 100 MG PO TABS: 100.0000 mg | ORAL_TABLET | Freq: Every day | ORAL | 0 refills | Status: DC

## 2016-09-06 MED ORDER — GENTAMICIN SULFATE 0.3 % OP SOLN: 2.0000 [drp] | OPHTHALMIC | 0 refills | Status: DC

## 2016-09-06 NOTE — Patient Instructions (Addendum)
I recommend warm compresses to both eyes for 5-10 minutes prior to application of antibiotic drops.  This will raise the temperature above the melting point of meibomian gland secretions and aide in expression of any secretions that might be blocking the glands.  You may use the antibiotic drops as frequently as hour today to hopefully speed up more immediate improvement. You do not need to wake from sleep to use them. Tomorrow start every 4 hours and can discontinue 2 days after symptoms resolve - so hopefully will no longer need within 5 days. When symptoms are improving just make sure you use the drops approximately 4 times a day.  Eyelid scrubs will clear away the scales on the lashes. Gently scrub the eyelids with a moist washcloth with diluted baby shampoo or other commercially available eyelid-cleansing agents twice daily. Both symptoms and clinical findings of blepharitis have been shown to improve with lid-scrub regimens.  Come back in for fasting labs and blood pressure recheck in approximately 2-4 weeks. We can recheck on your thyroid at that time as well.   Blepharitis Introduction Blepharitis is inflammation of the eyelids. Blepharitis may happen with:  Reddish, scaly skin around the scalp and eyebrows.  Burning or itching of the eyelids.  Eye discharge at night that causes the eyelashes to stick together in the morning.  Eyelashes that fall out.  Sensitivity to light. Follow these instructions at home: Pay attention to any changes in how you look or feel. Follow these instructions to help with your condition: Keeping Clean  Wash your hands often.  Wash your eyelids with warm water or with warm water that is mixed with a small amount of baby shampoo. Do this two times per day or as often as needed.  Wash your face and eyebrows at least once a day.  Use a clean towel each time you dry your eyelids. Do not use this towel to clean or dry other areas of your body. Do not share  your towel with anyone. General instructions  Avoid wearing makeup until you get better. Do not share makeup with anyone.  Avoid rubbing your eyes.  Apply warm compresses to your eyes 2 times per day for 10 minutes at a time, or as told by your health care provider.  If you were prescribed an antibiotic ointment or steroid drops, apply or use the medicine as told by your health care provider. Do not stop using the medicine even if you feel better.  Keep all follow-up visits as told by your health care provider. This is important. Contact a health care provider if:  Your eyelids feel hot.  You have blisters or a rash on your eyelids.  The condition does not go away in 2-4 days.  The inflammation gets worse. Get help right away if:  You have pain or redness that gets worse or spreads to other parts of your face.  Your vision changes.  You have pain when looking at lights or moving objects.  You have a fever. This information is not intended to replace advice given to you by your health care provider. Make sure you discuss any questions you have with your health care provider. Document Released: 08/19/2000 Document Revised: 01/28/2016 Document Reviewed: 12/15/2014  2017 Elsevier

## 2016-09-06 NOTE — Progress Notes (Addendum)
Subjective:  By signing my name below, I, [name], attest that this documentation has been prepared under the direction and in the presence of Delman Cheadle, MD Electronically Signed: Ladene Artist, ED Scribe 09/06/2016 at 12:32 PM.   Patient ID: Devin Ellison, male    DOB: August 28, 1956, 61 y.o.   MRN: [id number]  Chief Complaint  Patient presents with  . Eye Pain    left/ red swelling/ possible pink eye   HPI HPI Comments: Devin Ellison is a 61 y.o. male who presents to the [facility] complaining of left eye pain onset yesterday around 5 PM. Pt states that it felt as if his left eye had been scratched last night but he does not recall injury. He reports associated symptoms of yellow discharge with eye matting this morning. Pt reports URI symptoms approximately 2 weeks ago but none since. He denies visual disturbances or sick contacts with similar symptoms. Pt does not wear contact lenses but does wear reading glasses.   HTN Pt reports a h/o HTN that was treated by Dr. Joseph Art. Pt states that he was on Amlodipine and Losartan for several years but Dr. Joseph Art felt that his BP was well-controlled and discontinued his medications. Pt reports that his BP tends to rise with increased anxiety, which he states is increased today since he is supposed to report to work today at 2 PM. Pt denies any complications from his previous BP medication and agrees to restarting medications. He denies cough, sob, leg swelling, chest pain or pressure.   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  Past Medical History:  Diagnosis Date  . Depression    Current Outpatient Prescriptions on File Prior to Visit  Medication Sig Dispense Refill  . ARIPiprazole (ABILIFY) 10 MG tablet Take 1 tablet (10 mg total) by mouth daily. For Bipolar Mood disorder. (Patient taking differently: Take 5 mg by mouth daily. For Bipolar Mood disorder.) 30 tablet 1  . lamoTRIgine (LAMICTAL) 200 MG tablet  Take 1 tablet (200 mg total) by mouth daily. For mood disorder and depression, (Patient taking differently: Take 150 mg by mouth daily. For mood disorder and depression,) 30 tablet 0  . lithium 600 MG capsule Take 600 mg by mouth daily.    Marland Kitchen VIAGRA 100 MG tablet TAKE 1/2 TO 1 TABLET BY MOUTH ONCE DAILY AS NEEDED FOR ERECTILE DYSFUNCTION 5 tablet 7  . amLODipine (NORVASC) 5 MG tablet Take 1 tablet (5 mg total) by mouth daily. (Patient not taking: Reported on 09/06/2016) 90 tablet 3  . losartan (COZAAR) 100 MG tablet Take 1 tablet (100 mg total) by mouth daily. (Patient not taking: Reported on 09/06/2016) 90 tablet 3  . metroNIDAZOLE (FLAGYL) 500 MG tablet Take 1 tablet (500 mg total) by mouth 2 (two) times daily. (Patient not taking: Reported on 09/06/2016) 14 tablet 0   No current facility-administered medications on file prior to visit.    No Known Allergies  Review of Systems  Constitutional: Negative for activity change, appetite change, chills and fever.  HENT: Positive for facial swelling (L eye). Negative for congestion, postnasal drip, rhinorrhea, sinus pain, sinus pressure, sore throat, trouble swallowing and voice change.   Eyes: Positive for pain, discharge, redness and visual disturbance. Negative for photophobia.  Respiratory: Negative for cough and shortness of breath.   Cardiovascular: Negative for chest pain and leg swelling.  Hematological: Negative for adenopathy.      Objective:   Physical Exam  Constitutional: He is  oriented to person, place, and time. He appears well-developed and well-nourished. No distress.  HENT:  Head: Normocephalic and atraumatic.  Right Ear: Tympanic membrane normal.  Left Ear: Tympanic membrane normal.  Nose: Nose normal.  Mouth/Throat: Posterior oropharyngeal erythema present.  +postnasal drip  Eyes: Conjunctivae and EOM are normal. Left eye exhibits no chemosis.  Slit lamp exam:      The left eye shows no corneal abrasion, no corneal ulcer, no  foreign body and no fluorescein uptake.  Swelling of L upper lid with erythema. No significant L lower lid swelling. Conjunctiva erythema. No chemosis. flourescein stain did not reveal any focal abnormality.  Neck: Neck supple. No tracheal deviation present.  Cardiovascular: Normal rate.   Pulmonary/Chest: Effort normal. No respiratory distress.  Musculoskeletal: Normal range of motion.  Neurological: He is alert and oriented to person, place, and time.  Skin: Skin is warm and dry.  Psychiatric: He has a normal mood and affect. His behavior is normal.  Nursing note and vitals reviewed.   Visual Acuity Screening   Right eye Left eye Both eyes  Without correction:     With correction: 20/20 20/25 20/25    BP (!) 178/98   Pulse 60   Temp 97.8 F (36.6 C) (Oral)   Resp 16   Ht 6' (1.829 m)   Wt 216 lb (98 kg)   SpO2 99%   BMI 29.29 kg/m     Assessment & Plan:   1. Blepharitis of left upper eyelid, unspecified type - freq warm compresses prior to abx gtts  2. Essential hypertension - has been elev at other visits as well - restart losartan 100 and amlodipine 5. RTC in 3-4 wks for fasting labs and BP recheck.   3. Acute conjunctivitis of left eye, unspecified acute conjunctivitis type    4.     Abnml tsh - Reports his psychiatrists has checked his labs due to lithium - was told his thyroid was borderline - recheck with next labs.  Meds ordered this encounter  Medications  . amLODipine (NORVASC) 5 MG tablet    Sig: Take 1 tablet (5 mg total) by mouth daily.    Dispense:  90 tablet    Refill:  3  . losartan (COZAAR) 100 MG tablet    Sig: Take 1 tablet (100 mg total) by mouth daily.    Dispense:  90 tablet    Refill:  0  . gentamicin (GARAMYCIN) 0.3 % ophthalmic solution    Sig: Place 2 drops into the left eye every 4 (four) hours. While awake. Can start with 2 drops every 1-2 hours today.    Dispense:  5 mL    Refill:  0    I personally performed the services described in  this documentation, which was scribed in my presence. The recorded information has been reviewed and considered, and addended by me as needed.   Delman Cheadle, M.D.  [facility] [address] [place], [place] [address] [phone] phone [phone] fax  09/07/16 12:20 AM

## 2016-09-29 DIAGNOSIS — Z79899 Other long term (current) drug therapy: Secondary | ICD-10-CM | POA: Diagnosis not present

## 2016-09-29 DIAGNOSIS — Z125 Encounter for screening for malignant neoplasm of prostate: Secondary | ICD-10-CM | POA: Diagnosis not present

## 2016-09-29 DIAGNOSIS — Z0000 Encounter for general adult medical examination without abnormal findings: Secondary | ICD-10-CM | POA: Diagnosis not present

## 2016-09-29 DIAGNOSIS — I10 Essential (primary) hypertension: Secondary | ICD-10-CM | POA: Diagnosis not present

## 2016-09-29 DIAGNOSIS — Z23 Encounter for immunization: Secondary | ICD-10-CM | POA: Diagnosis not present

## 2016-11-16 DIAGNOSIS — F3132 Bipolar disorder, current episode depressed, moderate: Secondary | ICD-10-CM | POA: Diagnosis not present

## 2016-12-01 ENCOUNTER — Other Ambulatory Visit: Payer: Self-pay | Admitting: Family Medicine

## 2016-12-01 DIAGNOSIS — I10 Essential (primary) hypertension: Secondary | ICD-10-CM

## 2016-12-01 NOTE — Telephone Encounter (Signed)
BP Readings from Last 3 Encounters:  09/06/16 (!) 178/98  08/28/15 (!) 161/90  01/15/15 116/60     Needs OV for management of his uncontrolled HTN.

## 2016-12-27 ENCOUNTER — Other Ambulatory Visit: Payer: Self-pay | Admitting: Urgent Care

## 2016-12-27 DIAGNOSIS — I10 Essential (primary) hypertension: Secondary | ICD-10-CM

## 2016-12-28 DIAGNOSIS — F3132 Bipolar disorder, current episode depressed, moderate: Secondary | ICD-10-CM | POA: Diagnosis not present

## 2017-01-03 DIAGNOSIS — I10 Essential (primary) hypertension: Secondary | ICD-10-CM

## 2017-02-07 ENCOUNTER — Ambulatory Visit (INDEPENDENT_AMBULATORY_CARE_PROVIDER_SITE_OTHER): Payer: BLUE CROSS/BLUE SHIELD | Admitting: Physician Assistant

## 2017-02-07 ENCOUNTER — Encounter: Payer: Self-pay | Admitting: Physician Assistant

## 2017-02-07 VITALS — BP 150/90 | HR 66 | Temp 99.1°F | Resp 16 | Ht 72.0 in | Wt 217.0 lb

## 2017-02-07 DIAGNOSIS — I10 Essential (primary) hypertension: Secondary | ICD-10-CM

## 2017-02-07 DIAGNOSIS — R7989 Other specified abnormal findings of blood chemistry: Secondary | ICD-10-CM

## 2017-02-07 DIAGNOSIS — R030 Elevated blood-pressure reading, without diagnosis of hypertension: Secondary | ICD-10-CM

## 2017-02-07 DIAGNOSIS — R946 Abnormal results of thyroid function studies: Secondary | ICD-10-CM

## 2017-02-07 MED ORDER — LOSARTAN POTASSIUM 100 MG PO TABS: ORAL_TABLET | ORAL | 0 refills | Status: DC

## 2017-02-07 MED ORDER — AMLODIPINE BESYLATE 5 MG PO TABS: 5.0000 mg | ORAL_TABLET | Freq: Every day | ORAL | 3 refills | Status: DC

## 2017-02-07 MED ORDER — LOSARTAN POTASSIUM 100 MG PO TABS: 100.0000 mg | ORAL_TABLET | Freq: Every day | ORAL | 0 refills | Status: DC

## 2017-02-07 NOTE — Addendum Note (Signed)
Addended by: Virgia Land on: 02/07/2017 11:05 AM   Modules accepted: Orders

## 2017-02-07 NOTE — Progress Notes (Signed)
Devin Ellison  MRN: [id number] DOB: 1955/10/10  PCP: System, Pcp Not In  Subjective:  Pt is a 61 year old male who presents to clinic for blood pressure medication refill.   HTN - Last OV was 5 months ago: " Blood pressure has been elev at other visits as well - restart losartan 100 and amlodipine 5. RTC in 3-4 wks for fasting labs and BP recheck. Abnml tsh - Reports his psychiatrists has checked his labs due to lithium - was told his thyroid was borderline - recheck with next labs." Pt did not return for blood work/recheck.   Today he says his last dose of losartan >3 weeks ago. Still has a few Norvac left. Takes medications every morning.  Norvasc 2m and losartan 100 mg. He admits having a "lazy bone", is not consistent with f[place]u appts [place]  Exercise - Cardio several times a week. This is down from his old habit of 7 days/week.  Diet - Red meat, chicken, vegetables. Not a lot of fruit. He drinks diet soda and tea. No water.  Today's blood pressure is 157/95. Recheck is 150/90.   Denies chest pain, palpitations, light headedness, shob, DOE, vision changes.    Review of Systems  Constitutional: Negative for chills and diaphoresis.  Respiratory: Negative for cough, chest tightness, shortness of breath and wheezing.   Cardiovascular: Negative for chest pain, palpitations and leg swelling.  Gastrointestinal: Negative for diarrhea, nausea and vomiting.  Musculoskeletal: Negative for neck pain.  Neurological: Negative for dizziness, syncope, light-headedness and headaches.  Psychiatric/Behavioral: Negative for sleep disturbance. The patient is not nervous/anxious.     Patient Active Problem List   Diagnosis Date Noted  . MDD (major depressive disorder), single episode 04/30/2013  . Suicidal ideation 04/30/2013    Current Outpatient Prescriptions on File Prior to Visit  Medication Sig Dispense Refill  . amLODipine (NORVASC) 5 MG tablet Take 1  tablet (5 mg total) by mouth daily. 90 tablet 3  . ARIPiprazole (ABILIFY) 10 MG tablet Take 1 tablet (10 mg total) by mouth daily. For Bipolar Mood disorder. (Patient taking differently: Take 5 mg by mouth daily. For Bipolar Mood disorder.) 30 tablet 1  . gentamicin (GARAMYCIN) 0.3 % ophthalmic solution Place 2 drops into the left eye every 4 (four) hours. While awake. Can start with 2 drops every 1-2 hours today. 5 mL 0  . lamoTRIgine (LAMICTAL) 200 MG tablet Take 1 tablet (200 mg total) by mouth daily. For mood disorder and depression, (Patient taking differently: Take 150 mg by mouth daily. For mood disorder and depression,) 30 tablet 0  . lithium 600 MG capsule Take 600 mg by mouth daily.    .Marland Kitchenlosartan (COZAAR) 100 MG tablet Office visit needed for additional refills. 2nd notice. 15 tablet 0  . VIAGRA 100 MG tablet TAKE 1/2 TO 1 TABLET BY MOUTH ONCE DAILY AS NEEDED FOR ERECTILE DYSFUNCTION 5 tablet 7   No current facility-administered medications on file prior to visit.     No Known Allergies   Objective:  There were no vitals taken for this visit.  Physical Exam  Constitutional: He is oriented to person, place, and time and well-developed, well-nourished, and in no distress. No distress.  Cardiovascular: Normal rate, regular rhythm, normal heart sounds and normal pulses.   Musculoskeletal:       Right lower leg: He exhibits no edema.       Left lower leg: He exhibits no edema.  Neurological: He is alert and oriented to person, place, and time. GCS score is 15.  Skin: Skin is warm and dry.  Psychiatric: Mood, memory, affect and judgment normal.  Vitals reviewed.   Assessment and Plan :  1. Essential hypertension 2. Elevated blood pressure reading - amLODipine (NORVASC) 5 MG tablet; Take 1 tablet (5 mg total) by mouth daily.  Dispense: 90 tablet; Refill: 3 - losartan (COZAAR) 100 MG tablet; Office visit needed for additional refills. 2nd notice.  Dispense: 15 tablet; Refill: 0 -  CMP14+EGFR - Lipid panel - Pt has been out of Losartan for several weeks. Will restart medications, f/u in 4 weeks for recheck. Labs are pending. DASH diet encouraged.   3. Abnormal TSH - TSH - Pt was asked to f/u 6 months ago for abnormal lab value. Will check this today.   Mercer Pod, PA-C  [facility] 02/07/2017 8:16 AM

## 2017-02-07 NOTE — Patient Instructions (Addendum)
Please schedule a follow-up appt with me in 4 weeks for blood pressure recheck.   Thank you for coming in today. I hope you feel we met your needs.  Feel free to call [facility] if you have any questions or further requests.  Please consider signing up for MyChart if you do not already have it, as this is a great way to communicate with me.  Best,  Whitney McVey, PA-C   DASH Eating Plan DASH stands for "Dietary Approaches to Stop Hypertension." The DASH eating plan is a healthy eating plan that has been shown to reduce high blood pressure (hypertension). It may also reduce your risk for type 2 diabetes, heart disease, and stroke. The DASH eating plan may also help with weight loss. What are tips for following this plan? General guidelines  Avoid eating more than 2,300 mg (milligrams) of salt (sodium) a day. If you have hypertension, you may need to reduce your sodium intake to 1,500 mg a day.  Limit alcohol intake to no more than 1 drink a day for nonpregnant women and 2 drinks a day for men. One drink equals 12 oz of beer, 5 oz of wine, or 1 oz of hard liquor.  Work with your health care provider to maintain a healthy body weight or to lose weight. Ask what an ideal weight is for you.  Get at least 30 minutes of exercise that causes your heart to beat faster (aerobic exercise) most days of the week. Activities may include walking, swimming, or biking.  Work with your health care provider or diet and nutrition specialist (dietitian) to adjust your eating plan to your individual calorie needs. Reading food labels  Check food labels for the amount of sodium per serving. Choose foods with less than 5 percent of the Daily Value of sodium. Generally, foods with less than 300 mg of sodium per serving fit into this eating plan.  To find whole grains, look for the word "whole" as the first word in the ingredient list. Shopping  Buy products labeled as "low-sodium" or "no salt added."  Buy fresh  foods. Avoid canned foods and premade or frozen meals. Cooking  Avoid adding salt when cooking. Use salt-free seasonings or herbs instead of table salt or sea salt. Check with your health care provider or pharmacist before using salt substitutes.  Do not fry foods. Cook foods using healthy methods such as baking, boiling, grilling, and broiling instead.  Cook with heart-healthy oils, such as olive, canola, soybean, or sunflower oil. Meal planning   Eat a balanced diet that includes: ? 5 or more servings of fruits and vegetables each day. At each meal, try to fill half of your plate with fruits and vegetables. ? Up to 6-8 servings of whole grains each day. ? Less than 6 oz of lean meat, poultry, or fish each day. A 3-oz serving of meat is about the same size as a deck of cards. One egg equals 1 oz. ? 2 servings of low-fat dairy each day. ? A serving of nuts, seeds, or beans 5 times each week. ? Heart-healthy fats. Healthy fats called Omega-3 fatty acids are found in foods such as flaxseeds and coldwater fish, like sardines, salmon, and mackerel.  Limit how much you eat of the following: ? Canned or prepackaged foods. ? Food that is high in trans fat, such as fried foods. ? Food that is high in saturated fat, such as fatty meat. ? Sweets, desserts, sugary drinks, and other foods  with added sugar. ? Full-fat dairy products.  Do not salt foods before eating.  Try to eat at least 2 vegetarian meals each week.  Eat more home-cooked food and less restaurant, buffet, and fast food.  When eating at a restaurant, ask that your food be prepared with less salt or no salt, if possible. What foods are recommended? The items listed may not be a complete list. Talk with your dietitian about what dietary choices are best for you. Grains Whole-grain or whole-wheat bread. Whole-grain or whole-wheat pasta. Brown rice. [place] Morrow. Bulgur. Whole-grain and low-sodium cereals. Pita bread. Low-fat,  low-sodium crackers. Whole-wheat flour tortillas. Vegetables Fresh or frozen vegetables (raw, steamed, roasted, or grilled). Low-sodium or reduced-sodium tomato and vegetable juice. Low-sodium or reduced-sodium tomato sauce and tomato paste. Low-sodium or reduced-sodium canned vegetables. Fruits All fresh, dried, or frozen fruit. Canned fruit in natural juice (without added sugar). Meat and other protein foods Skinless chicken or [place]. Ground chicken or [place]. Pork with fat trimmed off. Fish and seafood. Egg whites. Dried beans, peas, or lentils. Unsalted nuts, nut butters, and seeds. Unsalted canned beans. Lean cuts of beef with fat trimmed off. Low-sodium, lean deli meat. Dairy Low-fat (1%) or fat-free (skim) milk. Fat-free, low-fat, or reduced-fat cheeses. Nonfat, low-sodium ricotta or cottage cheese. Low-fat or nonfat yogurt. Low-fat, low-sodium cheese. Fats and oils Soft margarine without trans fats. Vegetable oil. Low-fat, reduced-fat, or light mayonnaise and salad dressings (reduced-sodium). Canola, safflower, olive, soybean, and sunflower oils. Avocado. Seasoning and other foods Herbs. Spices. Seasoning mixes without salt. Unsalted popcorn and pretzels. Fat-free sweets. What foods are not recommended? The items listed may not be a complete list. Talk with your dietitian about what dietary choices are best for you. Grains Baked goods made with fat, such as croissants, muffins, or some breads. Dry pasta or rice meal packs. Vegetables Creamed or fried vegetables. Vegetables in a cheese sauce. Regular canned vegetables (not low-sodium or reduced-sodium). Regular canned tomato sauce and paste (not low-sodium or reduced-sodium). Regular tomato and vegetable juice (not low-sodium or reduced-sodium). Angie Fava. Olives. Fruits Canned fruit in a light or heavy syrup. Fried fruit. Fruit in cream or butter sauce. Meat and other protein foods Fatty cuts of meat. Ribs. Fried meat. Berniece Salines. Sausage.  Bologna and other processed lunch meats. Salami. Fatback. Hotdogs. Bratwurst. Salted nuts and seeds. Canned beans with added salt. Canned or smoked fish. Whole eggs or egg yolks. Chicken or [place] with skin. Dairy Whole or 2% milk, cream, and half-and-half. Whole or full-fat cream cheese. Whole-fat or sweetened yogurt. Full-fat cheese. Nondairy creamers. Whipped toppings. Processed cheese and cheese spreads. Fats and oils Butter. Stick margarine. Lard. Shortening. Ghee. Bacon fat. Tropical oils, such as coconut, palm kernel, or palm oil. Seasoning and other foods Salted popcorn and pretzels. Onion salt, garlic salt, seasoned salt, table salt, and sea salt. Worcestershire sauce. Tartar sauce. Barbecue sauce. Teriyaki sauce. Soy sauce, including reduced-sodium. Steak sauce. Canned and packaged gravies. Fish sauce. Oyster sauce. Cocktail sauce. Horseradish that you find on the shelf. Ketchup. Mustard. Meat flavorings and tenderizers. Bouillon cubes. Hot sauce and Tabasco sauce. Premade or packaged marinades. Premade or packaged taco seasonings. Relishes. Regular salad dressings. Where to find more information:  National Heart, Lung, and [place]: https://wilson-eaton.com/  American Heart Association: www.heart.org Summary  The DASH eating plan is a healthy eating plan that has been shown to reduce high blood pressure (hypertension). It may also reduce your risk for type 2 diabetes, heart disease, and stroke.  With the  DASH eating plan, you should limit salt (sodium) intake to 2,300 mg a day. If you have hypertension, you may need to reduce your sodium intake to 1,500 mg a day.  When on the DASH eating plan, aim to eat more fresh fruits and vegetables, whole grains, lean proteins, low-fat dairy, and heart-healthy fats.  Work with your health care provider or diet and nutrition specialist (dietitian) to adjust your eating plan to your individual calorie needs. This information is not intended to  replace advice given to you by your health care provider. Make sure you discuss any questions you have with your health care provider. Document Released: 08/11/2011 Document Revised: 08/15/2016 Document Reviewed: 08/15/2016 Elsevier Interactive Patient Education  2017 [employer].  IF you received an x-ray today, you will receive an invoice from [facility]. Please contact [facility] at [phone] with questions or concerns regarding your invoice.   IF you received labwork today, you will receive an invoice from [facility]. Please contact [facility] at [phone] with questions or concerns regarding your invoice.   Our billing staff will not be able to assist you with questions regarding bills from these companies.  You will be contacted with the lab results as soon as they are available. The fastest way to get your results is to activate your My Chart account. Instructions are located on the last page of this paperwork. If you have not heard from [place] regarding the results in 2 weeks, please contact this office.

## 2017-02-08 LAB — CMP14+EGFR
ALT: 9 IU/L (ref 0–44)
AST: 14 IU/L (ref 0–40)
Albumin/Globulin Ratio: 2.1 (ref 1.2–2.2)
Albumin: 4.6 g/dL (ref 3.6–4.8)
Alkaline Phosphatase: 67 IU/L (ref 39–117)
BUN/Creatinine Ratio: 13 (ref 10–24)
BUN: 16 mg/dL (ref 8–27)
Bilirubin Total: 0.5 mg/dL (ref 0.0–1.2)
CO2: 25 mmol/L (ref 18–29)
Calcium: 9.4 mg/dL (ref 8.6–10.2)
Chloride: 104 mmol/L (ref 96–106)
Creatinine, Ser: 1.21 mg/dL (ref 0.76–1.27)
GFR calc Af Amer: 74 mL/min/{1.73_m2} (ref >59)
GFR calc non Af Amer: 64 mL/min/{1.73_m2} (ref >59)
Globulin, Total: 2.2 g/dL (ref 1.5–4.5)
Glucose: 121 mg/dL — ABNORMAL HIGH (ref 65–99)
Potassium: 4.6 mmol/L (ref 3.5–5.2)
Sodium: 140 mmol/L (ref 134–144)
Total Protein: 6.8 g/dL (ref 6.0–8.5)

## 2017-02-08 LAB — LIPID PANEL
Chol/HDL Ratio: 2.3 ratio (ref 0.0–5.0)
Cholesterol, Total: 186 mg/dL (ref 100–199)
HDL: 82 mg/dL (ref >39)
LDL Calculated: 88 mg/dL (ref 0–99)
Triglycerides: 80 mg/dL (ref 0–149)
VLDL Cholesterol Cal: 16 mg/dL (ref 5–40)

## 2017-02-08 LAB — TSH: TSH: 3.16 u[IU]/mL (ref 0.450–4.500)

## 2017-02-28 DIAGNOSIS — F3132 Bipolar disorder, current episode depressed, moderate: Secondary | ICD-10-CM | POA: Diagnosis not present

## 2017-03-11 ENCOUNTER — Other Ambulatory Visit: Payer: Self-pay | Admitting: Physician Assistant

## 2017-03-11 DIAGNOSIS — I10 Essential (primary) hypertension: Secondary | ICD-10-CM

## 2017-03-13 ENCOUNTER — Encounter: Payer: Self-pay | Admitting: Physician Assistant

## 2017-03-13 ENCOUNTER — Ambulatory Visit (INDEPENDENT_AMBULATORY_CARE_PROVIDER_SITE_OTHER): Payer: BLUE CROSS/BLUE SHIELD | Admitting: Physician Assistant

## 2017-03-13 VITALS — BP 156/98 | HR 66 | Temp 97.2°F | Resp 16 | Ht 71.75 in | Wt 224.4 lb

## 2017-03-13 DIAGNOSIS — R030 Elevated blood-pressure reading, without diagnosis of hypertension: Secondary | ICD-10-CM

## 2017-03-13 DIAGNOSIS — I10 Essential (primary) hypertension: Secondary | ICD-10-CM

## 2017-03-13 HISTORY — DX: Essential (primary) hypertension: I10

## 2017-03-13 MED ORDER — LOSARTAN POTASSIUM 100 MG PO TABS: 100.0000 mg | ORAL_TABLET | Freq: Every day | ORAL | 3 refills | Status: DC

## 2017-03-13 NOTE — Patient Instructions (Addendum)
Your blood pressure is elevated today. START taking '10mg'$  Norvasc. Try to take your blood pressure readings a few times before your next appointment. You can do this at a [facility], [facility], etc. Come back and see me in 4-6 weeks for recheck.   Thank you for coming in today. I hope you feel we met your needs.  Feel free to call [facility] if you have any questions or further requests.  Please consider signing up for MyChart if you do not already have it, as this is a great way to communicate with me.  Best,  Whitney McVey, PA-C  DASH Eating Plan DASH stands for "Dietary Approaches to Stop Hypertension." The DASH eating plan is a healthy eating plan that has been shown to reduce high blood pressure (hypertension). It may also reduce your risk for type 2 diabetes, heart disease, and stroke. The DASH eating plan may also help with weight loss. What are tips for following this plan? General guidelines  Avoid eating more than 2,300 mg (milligrams) of salt (sodium) a day. If you have hypertension, you may need to reduce your sodium intake to 1,500 mg a day.  Limit alcohol intake to no more than 1 drink a day for nonpregnant women and 2 drinks a day for men. One drink equals 12 oz of beer, 5 oz of wine, or 1 oz of hard liquor.  Work with your health care provider to maintain a healthy body weight or to lose weight. Ask what an ideal weight is for you.  Get at least 30 minutes of exercise that causes your heart to beat faster (aerobic exercise) most days of the week. Activities may include walking, swimming, or biking.  Work with your health care provider or diet and nutrition specialist (dietitian) to adjust your eating plan to your individual calorie needs. Reading food labels  Check food labels for the amount of sodium per serving. Choose foods with less than 5 percent of the Daily Value of sodium. Generally, foods with less than 300 mg of sodium per serving fit into this eating plan.  To find whole  grains, look for the word "whole" as the first word in the ingredient list. Shopping  Buy products labeled as "low-sodium" or "no salt added."  Buy fresh foods. Avoid canned foods and premade or frozen meals. Cooking  Avoid adding salt when cooking. Use salt-free seasonings or herbs instead of table salt or sea salt. Check with your health care provider or pharmacist before using salt substitutes.  Do not fry foods. Cook foods using healthy methods such as baking, boiling, grilling, and broiling instead.  Cook with heart-healthy oils, such as olive, canola, soybean, or sunflower oil. Meal planning   Eat a balanced diet that includes: ? 5 or more servings of fruits and vegetables each day. At each meal, try to fill half of your plate with fruits and vegetables. ? Up to 6-8 servings of whole grains each day. ? Less than 6 oz of lean meat, poultry, or fish each day. A 3-oz serving of meat is about the same size as a deck of cards. One egg equals 1 oz. ? 2 servings of low-fat dairy each day. ? A serving of nuts, seeds, or beans 5 times each week. ? Heart-healthy fats. Healthy fats called Omega-3 fatty acids are found in foods such as flaxseeds and coldwater fish, like sardines, salmon, and mackerel.  Limit how much you eat of the following: ? Canned or prepackaged foods. ? Food that is  high in trans fat, such as fried foods. ? Food that is high in saturated fat, such as fatty meat. ? Sweets, desserts, sugary drinks, and other foods with added sugar. ? Full-fat dairy products.  Do not salt foods before eating.  Try to eat at least 2 vegetarian meals each week.  Eat more home-cooked food and less restaurant, buffet, and fast food.  When eating at a restaurant, ask that your food be prepared with less salt or no salt, if possible. What foods are recommended? The items listed may not be a complete list. Talk with your dietitian about what dietary choices are best for  you. Grains Whole-grain or whole-wheat bread. Whole-grain or whole-wheat pasta. Brown rice. [place] Morrow. Bulgur. Whole-grain and low-sodium cereals. Pita bread. Low-fat, low-sodium crackers. Whole-wheat flour tortillas. Vegetables Fresh or frozen vegetables (raw, steamed, roasted, or grilled). Low-sodium or reduced-sodium tomato and vegetable juice. Low-sodium or reduced-sodium tomato sauce and tomato paste. Low-sodium or reduced-sodium canned vegetables. Fruits All fresh, dried, or frozen fruit. Canned fruit in natural juice (without added sugar). Meat and other protein foods Skinless chicken or [place]. Ground chicken or [place]. Pork with fat trimmed off. Fish and seafood. Egg whites. Dried beans, peas, or lentils. Unsalted nuts, nut butters, and seeds. Unsalted canned beans. Lean cuts of beef with fat trimmed off. Low-sodium, lean deli meat. Dairy Low-fat (1%) or fat-free (skim) milk. Fat-free, low-fat, or reduced-fat cheeses. Nonfat, low-sodium ricotta or cottage cheese. Low-fat or nonfat yogurt. Low-fat, low-sodium cheese. Fats and oils Soft margarine without trans fats. Vegetable oil. Low-fat, reduced-fat, or light mayonnaise and salad dressings (reduced-sodium). Canola, safflower, olive, soybean, and sunflower oils. Avocado. Seasoning and other foods Herbs. Spices. Seasoning mixes without salt. Unsalted popcorn and pretzels. Fat-free sweets. What foods are not recommended? The items listed may not be a complete list. Talk with your dietitian about what dietary choices are best for you. Grains Baked goods made with fat, such as croissants, muffins, or some breads. Dry pasta or rice meal packs. Vegetables Creamed or fried vegetables. Vegetables in a cheese sauce. Regular canned vegetables (not low-sodium or reduced-sodium). Regular canned tomato sauce and paste (not low-sodium or reduced-sodium). Regular tomato and vegetable juice (not low-sodium or reduced-sodium). Angie Fava.  Olives. Fruits Canned fruit in a light or heavy syrup. Fried fruit. Fruit in cream or butter sauce. Meat and other protein foods Fatty cuts of meat. Ribs. Fried meat. Berniece Salines. Sausage. Bologna and other processed lunch meats. Salami. Fatback. Hotdogs. Bratwurst. Salted nuts and seeds. Canned beans with added salt. Canned or smoked fish. Whole eggs or egg yolks. Chicken or [place] with skin. Dairy Whole or 2% milk, cream, and half-and-half. Whole or full-fat cream cheese. Whole-fat or sweetened yogurt. Full-fat cheese. Nondairy creamers. Whipped toppings. Processed cheese and cheese spreads. Fats and oils Butter. Stick margarine. Lard. Shortening. Ghee. Bacon fat. Tropical oils, such as coconut, palm kernel, or palm oil. Seasoning and other foods Salted popcorn and pretzels. Onion salt, garlic salt, seasoned salt, table salt, and sea salt. Worcestershire sauce. Tartar sauce. Barbecue sauce. Teriyaki sauce. Soy sauce, including reduced-sodium. Steak sauce. Canned and packaged gravies. Fish sauce. Oyster sauce. Cocktail sauce. Horseradish that you find on the shelf. Ketchup. Mustard. Meat flavorings and tenderizers. Bouillon cubes. Hot sauce and Tabasco sauce. Premade or packaged marinades. Premade or packaged taco seasonings. Relishes. Regular salad dressings. Where to find more information:  National Heart, Lung, and [place]: https://wilson-eaton.com/  American Heart Association: www.heart.org Summary  The DASH eating plan is a healthy eating  plan that has been shown to reduce high blood pressure (hypertension). It may also reduce your risk for type 2 diabetes, heart disease, and stroke.  With the DASH eating plan, you should limit salt (sodium) intake to 2,300 mg a day. If you have hypertension, you may need to reduce your sodium intake to 1,500 mg a day.  When on the DASH eating plan, aim to eat more fresh fruits and vegetables, whole grains, lean proteins, low-fat dairy, and heart-healthy  fats.  Work with your health care provider or diet and nutrition specialist (dietitian) to adjust your eating plan to your individual calorie needs. This information is not intended to replace advice given to you by your health care provider. Make sure you discuss any questions you have with your health care provider. Document Released: 08/11/2011 Document Revised: 08/15/2016 Document Reviewed: 08/15/2016 Elsevier Interactive Patient Education  2017 [employer].  IF you received an x-ray today, you will receive an invoice from [facility]. Please contact [facility] at [phone] with questions or concerns regarding your invoice.   IF you received labwork today, you will receive an invoice from [facility]. Please contact [facility] at [phone] with questions or concerns regarding your invoice.   Our billing staff will not be able to assist you with questions regarding bills from these companies.  You will be contacted with the lab results as soon as they are available. The fastest way to get your results is to activate your My Chart account. Instructions are located on the last page of this paperwork. If you have not heard from [place] regarding the results in 2 weeks, please contact this office.

## 2017-03-13 NOTE — Progress Notes (Signed)
Devin Ellison  MRN: [id number] DOB: 06-04-1956  PCP: System, Pcp Not In  Subjective:  Pt is a 61 year old male presents to clinic for blood pressure check.   HTN - Last OV 4 weeks ago. Today's blood pressure is 163/94. Recheck is 156/98. He is taking Norvasc 5mg  and losartan 100 mg. He takes his medications daily.  Does not check blood pressures at home. He admits having a "lazy bone", is not consistent with f/u appts unless he schedules them at [place].  Exercise - Cardio a few times a week. This is down from his old habit of 7 days/week. 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  Diet - Red meat, chicken, vegetables. Not a lot of fruit. He drinks diet soda and tea. No water.   Hypertension ROS: taking medications as instructed, no medication side effects noted, no TIA's, no chest pain on exertion, no dyspnea on exertion and no swelling of ankles.  New concerns: none.    Review of Systems  Constitutional: Negative for chills and diaphoresis.  Respiratory: Negative for cough, chest tightness, shortness of breath and wheezing.   Cardiovascular: Negative for chest pain, palpitations and leg swelling.  Gastrointestinal: Negative for diarrhea, nausea and vomiting.  Musculoskeletal: Negative for neck pain.  Neurological: Negative for dizziness, syncope, light-headedness and headaches.  Psychiatric/Behavioral: Negative for sleep disturbance. The patient is not nervous/anxious.     Patient Active Problem List   Diagnosis Date Noted  . MDD (major depressive disorder), single episode 04/30/2013  . Suicidal ideation 04/30/2013    Current Outpatient Prescriptions on File Prior to Visit  Medication Sig Dispense Refill  . amLODipine (NORVASC) 5 MG tablet Take 1 tablet (5 mg total) by mouth daily. 90 tablet 3  . ARIPiprazole (ABILIFY) 10 MG tablet Take 1 tablet (10 mg total) by mouth daily. For Bipolar Mood disorder. (Patient taking differently: Take 5 mg by mouth daily. For  Bipolar Mood disorder.) 30 tablet 1  . lamoTRIgine (LAMICTAL) 200 MG tablet Take 1 tablet (200 mg total) by mouth daily. For mood disorder and depression, (Patient taking differently: Take 150 mg by mouth daily. For mood disorder and depression,) 30 tablet 0  . lithium 600 MG capsule Take 600 mg by mouth daily.    Marland Kitchen losartan (COZAAR) 100 MG tablet Office visit needed for additional refills. 2nd notice. 15 tablet 0  . losartan (COZAAR) 100 MG tablet Take 1 tablet (100 mg total) by mouth daily. 30 tablet 0  . VIAGRA 100 MG tablet TAKE 1/2 TO 1 TABLET BY MOUTH ONCE DAILY AS NEEDED FOR ERECTILE DYSFUNCTION (Patient not taking: Reported on 02/07/2017) 5 tablet 7   No current facility-administered medications on file prior to visit.     No Known Allergies   Objective:  BP (!) 163/94   Pulse 66   Temp (!) 97.2 F (36.2 C) (Oral)   Resp 16   Ht 5' 11.75" (1.822 m)   Wt 224 lb 6.4 oz (101.8 kg)   SpO2 99%   BMI 30.65 kg/m   Physical Exam  Constitutional: He is oriented to person, place, and time and well-developed, well-nourished, and in no distress. No distress.  Cardiovascular: Normal rate, regular rhythm and normal heart sounds.   Neurological: He is alert and oriented to person, place, and time. GCS score is 15.  Skin: Skin is warm and dry.  Psychiatric: Mood, memory, affect and judgment normal.  Vitals reviewed.   Lab Results  Component Value Date  CHOL 186 02/07/2017   HDL 82 02/07/2017   LDLCALC 88 02/07/2017   TRIG 80 02/07/2017   CHOLHDL 2.3 02/07/2017   Lab Results  Component Value Date   TSH 3.160 02/07/2017     Assessment and Plan :  1. Essential hypertension 2. Elevated blood pressure reading - losartan (COZAAR) 100 MG tablet; Take 1 tablet (100 mg total) by mouth daily.  Dispense: 90 tablet; Refill: 3 - Recheck vitals - Hypertension needs improvement and needs to follow diet more regularly.  Plan:  Reviewed diet, exercise and weight control. Recommended  sodium restriction. The following changes are to be made: Increase Norvasc to 10mg  qd. RTC in 4-6 weeks for recheck. Advised pt to take blood pressures out of office.   Mercer Pod, PA-C  [facility] 03/13/2017 8:11 AM

## 2017-04-03 DIAGNOSIS — I10 Essential (primary) hypertension: Secondary | ICD-10-CM | POA: Diagnosis not present

## 2017-04-03 DIAGNOSIS — Z79899 Other long term (current) drug therapy: Secondary | ICD-10-CM | POA: Diagnosis not present

## 2017-04-11 ENCOUNTER — Encounter: Payer: Self-pay | Admitting: Physician Assistant

## 2017-04-11 ENCOUNTER — Ambulatory Visit (INDEPENDENT_AMBULATORY_CARE_PROVIDER_SITE_OTHER): Payer: BLUE CROSS/BLUE SHIELD | Admitting: Physician Assistant

## 2017-04-11 VITALS — BP 116/75 | HR 79 | Temp 99.3°F | Resp 16 | Ht 72.0 in | Wt 221.0 lb

## 2017-04-11 DIAGNOSIS — G4726 Circadian rhythm sleep disorder, shift work type: Secondary | ICD-10-CM | POA: Diagnosis not present

## 2017-04-11 DIAGNOSIS — R5383 Other fatigue: Secondary | ICD-10-CM | POA: Diagnosis not present

## 2017-04-11 DIAGNOSIS — I10 Essential (primary) hypertension: Secondary | ICD-10-CM

## 2017-04-11 DIAGNOSIS — R0683 Snoring: Secondary | ICD-10-CM | POA: Diagnosis not present

## 2017-04-11 MED ORDER — ARMODAFINIL 150 MG PO TABS: 150.0000 mg | ORAL_TABLET | Freq: Every day | ORAL | 0 refills | Status: DC

## 2017-04-11 MED ORDER — AMLODIPINE BESYLATE 10 MG PO TABS: 10.0000 mg | ORAL_TABLET | Freq: Every day | ORAL | 3 refills | Status: DC

## 2017-04-11 NOTE — Patient Instructions (Addendum)
You will be contacted to schedule appt for sleep study.  Start taking Nuvigil for shift work sleep disorder. Take this 1 hour before your shift.  Go to [facility] to find cheapest price.  Come back and see me in 3 months.   Thank you for coming in today. I hope you feel we met your needs.  Feel free to call [facility] if you have any questions or further requests.  Please consider signing up for MyChart if you do not already have it, as this is a great way to communicate with me.  Best,  Whitney McVey, PA-C   DASH Eating Plan DASH stands for "Dietary Approaches to Stop Hypertension." The DASH eating plan is a healthy eating plan that has been shown to reduce high blood pressure (hypertension). It may also reduce your risk for type 2 diabetes, heart disease, and stroke. The DASH eating plan may also help with weight loss. What are tips for following this plan? General guidelines  Avoid eating more than 2,300 mg (milligrams) of salt (sodium) a day. If you have hypertension, you may need to reduce your sodium intake to 1,500 mg a day.  Limit alcohol intake to no more than 1 drink a day for nonpregnant women and 2 drinks a day for men. One drink equals 12 oz of beer, 5 oz of wine, or 1 oz of hard liquor.  Work with your health care provider to maintain a healthy body weight or to lose weight. Ask what an ideal weight is for you.  Get at least 30 minutes of exercise that causes your heart to beat faster (aerobic exercise) most days of the week. Activities may include walking, swimming, or biking.  Work with your health care provider or diet and nutrition specialist (dietitian) to adjust your eating plan to your individual calorie needs. Reading food labels  Check food labels for the amount of sodium per serving. Choose foods with less than 5 percent of the Daily Value of sodium. Generally, foods with less than 300 mg of sodium per serving fit into this eating plan.  To find whole grains, look for  the word "whole" as the first word in the ingredient list. Shopping  Buy products labeled as "low-sodium" or "no salt added."  Buy fresh foods. Avoid canned foods and premade or frozen meals. Cooking  Avoid adding salt when cooking. Use salt-free seasonings or herbs instead of table salt or sea salt. Check with your health care provider or pharmacist before using salt substitutes.  Do not fry foods. Cook foods using healthy methods such as baking, boiling, grilling, and broiling instead.  Cook with heart-healthy oils, such as olive, canola, soybean, or sunflower oil. Meal planning   Eat a balanced diet that includes: ? 5 or more servings of fruits and vegetables each day. At each meal, try to fill half of your plate with fruits and vegetables. ? Up to 6-8 servings of whole grains each day. ? Less than 6 oz of lean meat, poultry, or fish each day. A 3-oz serving of meat is about the same size as a deck of cards. One egg equals 1 oz. ? 2 servings of low-fat dairy each day. ? A serving of nuts, seeds, or beans 5 times each week. ? Heart-healthy fats. Healthy fats called Omega-3 fatty acids are found in foods such as flaxseeds and coldwater fish, like sardines, salmon, and mackerel.  Limit how much you eat of the following: ? Canned or prepackaged foods. ? Food  that is high in trans fat, such as fried foods. ? Food that is high in saturated fat, such as fatty meat. ? Sweets, desserts, sugary drinks, and other foods with added sugar. ? Full-fat dairy products.  Do not salt foods before eating.  Try to eat at least 2 vegetarian meals each week.  Eat more home-cooked food and less restaurant, buffet, and fast food.  When eating at a restaurant, ask that your food be prepared with less salt or no salt, if possible. What foods are recommended? The items listed may not be a complete list. Talk with your dietitian about what dietary choices are best for you. Grains Whole-grain or  whole-wheat bread. Whole-grain or whole-wheat pasta. Brown rice. [place]. Bulgur. Whole-grain and low-sodium cereals. Pita bread. Low-fat, low-sodium crackers. Whole-wheat flour tortillas. Vegetables Fresh or frozen vegetables (raw, steamed, roasted, or grilled). Low-sodium or reduced-sodium tomato and vegetable juice. Low-sodium or reduced-sodium tomato sauce and tomato paste. Low-sodium or reduced-sodium canned vegetables. Fruits All fresh, dried, or frozen fruit. Canned fruit in natural juice (without added sugar). Meat and other protein foods Skinless chicken or [place]. Ground chicken or [place]. Pork with fat trimmed off. Fish and seafood. Egg whites. Dried beans, peas, or lentils. Unsalted nuts, nut butters, and seeds. Unsalted canned beans. Lean cuts of beef with fat trimmed off. Low-sodium, lean deli meat. Dairy Low-fat (1%) or fat-free (skim) milk. Fat-free, low-fat, or reduced-fat cheeses. Nonfat, low-sodium ricotta or cottage cheese. Low-fat or nonfat yogurt. Low-fat, low-sodium cheese. Fats and oils Soft margarine without trans fats. Vegetable oil. Low-fat, reduced-fat, or light mayonnaise and salad dressings (reduced-sodium). Canola, safflower, olive, soybean, and sunflower oils. Avocado. Seasoning and other foods Herbs. Spices. Seasoning mixes without salt. Unsalted popcorn and pretzels. Fat-free sweets. What foods are not recommended? The items listed may not be a complete list. Talk with your dietitian about what dietary choices are best for you. Grains Baked goods made with fat, such as croissants, muffins, or some breads. Dry pasta or rice meal packs. Vegetables Creamed or fried vegetables. Vegetables in a cheese sauce. Regular canned vegetables (not low-sodium or reduced-sodium). Regular canned tomato sauce and paste (not low-sodium or reduced-sodium). Regular tomato and vegetable juice (not low-sodium or reduced-sodium). Angie Fava. Olives. Fruits Canned fruit in a light  or heavy syrup. Fried fruit. Fruit in cream or butter sauce. Meat and other protein foods Fatty cuts of meat. Ribs. Fried meat. Berniece Salines. Sausage. Bologna and other processed lunch meats. Salami. Fatback. Hotdogs. Bratwurst. Salted nuts and seeds. Canned beans with added salt. Canned or smoked fish. Whole eggs or egg yolks. Chicken or [place] with skin. Dairy Whole or 2% milk, cream, and half-and-half. Whole or full-fat cream cheese. Whole-fat or sweetened yogurt. Full-fat cheese. Nondairy creamers. Whipped toppings. Processed cheese and cheese spreads. Fats and oils Butter. Stick margarine. Lard. Shortening. Ghee. Bacon fat. Tropical oils, such as coconut, palm kernel, or palm oil. Seasoning and other foods Salted popcorn and pretzels. Onion salt, garlic salt, seasoned salt, table salt, and sea salt. Worcestershire sauce. Tartar sauce. Barbecue sauce. Teriyaki sauce. Soy sauce, including reduced-sodium. Steak sauce. Canned and packaged gravies. Fish sauce. Oyster sauce. Cocktail sauce. Horseradish that you find on the shelf. Ketchup. Mustard. Meat flavorings and tenderizers. Bouillon cubes. Hot sauce and Tabasco sauce. Premade or packaged marinades. Premade or packaged taco seasonings. Relishes. Regular salad dressings. Where to find more information:  National Heart, Lung, and [place]: https://wilson-eaton.com/  American Heart Association: www.heart.org Summary  The DASH eating plan is a  healthy eating plan that has been shown to reduce high blood pressure (hypertension). It may also reduce your risk for type 2 diabetes, heart disease, and stroke.  With the DASH eating plan, you should limit salt (sodium) intake to 2,300 mg a day. If you have hypertension, you may need to reduce your sodium intake to 1,500 mg a day.  When on the DASH eating plan, aim to eat more fresh fruits and vegetables, whole grains, lean proteins, low-fat dairy, and heart-healthy fats.  Work with your health care provider or  diet and nutrition specialist (dietitian) to adjust your eating plan to your individual calorie needs. This information is not intended to replace advice given to you by your health care provider. Make sure you discuss any questions you have with your health care provider. Document Released: 08/11/2011 Document Revised: 08/15/2016 Document Reviewed: 08/15/2016 Elsevier Interactive Patient Education  2017 [employer].   IF you received an x-ray today, you will receive an invoice from [facility]. Please contact [facility] at [phone] with questions or concerns regarding your invoice.   IF you received labwork today, you will receive an invoice from [place]. Please contact [facility] at [phone] with questions or concerns regarding your invoice.   Our billing staff will not be able to assist you with questions regarding bills from these companies.  You will be contacted with the lab results as soon as they are available. The fastest way to get your results is to activate your My Chart account. Instructions are located on the last page of this paperwork. If you have not heard from [place] regarding the results in 2 weeks, please contact this office.

## 2017-04-11 NOTE — Progress Notes (Signed)
Devin Ellison  MRN: [id number] DOB: 1955-10-01  PCP: System, Pcp Not In  Subjective:  Pt is a 61 year old male PMH HTN who presents to clinic for blood pressure recheck.   HTN - Last OV 6/5 HPI: "Today he says his last dose of losartan >3 weeks ago. Still has a few Norvac left. Takes medications every morning.  Norvasc 5mg  and losartan 100 mg. He admits having a "lazy bone", is not consistent with f/u appts unless he schedules them at [place]. Today's blood pressure is 157/95. Recheck is 150/90."  Last OV increased Norvasc from 5mg  qd to 10mg  qd. Today's blood pressure is 116/75. 118/68 at his psychiatrists' office last week. He also takes losartan 100 mg qd. Denies medication side effects. Denies chest pain, palpitations, headache, shob, leg swelling.   Diet - Red meat, chicken, vegetables. Not a lot of fruit. He drinks diet soda and tea. No water.  Exercise - Cardio several times a week. This is down from his old habit of 7 days/week. Too tired to exercise when he gets home from work.   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 He is very sleepy while driving every shift. He will fall asleep at a stop light.  "I am dead tired when I get home."   Admits to snoring. He has been evaluated for sleep apnea in the 1990's. He did not like the CPAP as he moves around a lot in his sleep.  "I sleep great" He goes to bed about 10pm every night. Wakes up around 5 am every morning.  Denies headache.   Review of Systems  Constitutional: Positive for fatigue.  Respiratory: Negative for chest tightness and shortness of breath.   Cardiovascular: Negative for chest pain and palpitations.  Neurological: Negative for headaches.  Psychiatric/Behavioral: Negative for sleep disturbance.    Patient Active Problem List   Diagnosis Date Noted  . Essential hypertension 03/13/2017  . MDD (major depressive disorder), single episode 04/30/2013  . Suicidal ideation  04/30/2013    Current Outpatient Prescriptions on File Prior to Visit  Medication Sig Dispense Refill  . ARIPiprazole (ABILIFY) 10 MG tablet Take 1 tablet (10 mg total) by mouth daily. For Bipolar Mood disorder. (Patient taking differently: Take 5 mg by mouth daily. For Bipolar Mood disorder.) 30 tablet 1  . lamoTRIgine (LAMICTAL) 200 MG tablet Take 1 tablet (200 mg total) by mouth daily. For mood disorder and depression, (Patient taking differently: Take 150 mg by mouth daily. For mood disorder and depression,) 30 tablet 0  . lithium 600 MG capsule Take 600 mg by mouth daily.    Marland Kitchen losartan (COZAAR) 100 MG tablet Office visit needed for additional refills. 2nd notice. 15 tablet 0  . losartan (COZAAR) 100 MG tablet Take 1 tablet (100 mg total) by mouth daily. 90 tablet 3  . VIAGRA 100 MG tablet TAKE 1/2 TO 1 TABLET BY MOUTH ONCE DAILY AS NEEDED FOR ERECTILE DYSFUNCTION 5 tablet 7   No current facility-administered medications on file prior to visit.     No Known Allergies   Objective:  There were no vitals taken for this visit.  Physical Exam  Constitutional: He is oriented to person, place, and time and well-developed, well-nourished, and in no distress. No distress.  Cardiovascular: Normal rate, regular rhythm and normal heart sounds.   Neurological: He is alert and oriented to person, place, and time. GCS score is 15.  Skin: Skin is warm  and dry.  Psychiatric: Mood, memory, affect and judgment normal.  Vitals reviewed.   Assessment and Plan :  1. Essential hypertension - amLODipine (NORVASC) 10 MG tablet; Take 1 tablet (10 mg total) by mouth daily.  Dispense: 90 tablet; Refill: 3 - Blood pressure well controlled. Norvasc 10mg  and Losartan 100mg . Encouraged exercising and DASH diet. RTC in 3 months for recheck.   2. Fatigue, unspecified type 3. Snores 4. Shift work sleep disorder - Ambulatory referral to Sleep Studies - Armodafinil 150 MG tablet; Take 1 tablet (150 mg total) by  mouth daily. Take 1 hour before the start of your shift.  Dispense: 45 tablet; Refill: 0 - Will treat for shift work sleep disorder, as pt drives for work. He is agreeable to receive sleep study. Discussed CPAP may relieve fatigue and HTN. He understands.   Mercer Pod, PA-C  [facility] at [place] 04/11/2017 1:45 PM

## 2017-04-25 DIAGNOSIS — F3132 Bipolar disorder, current episode depressed, moderate: Secondary | ICD-10-CM | POA: Diagnosis not present

## 2017-05-01 DIAGNOSIS — R7301 Impaired fasting glucose: Secondary | ICD-10-CM | POA: Diagnosis not present

## 2017-05-16 ENCOUNTER — Institutional Professional Consult (permissible substitution): Payer: BLUE CROSS/BLUE SHIELD | Admitting: Neurology

## 2017-05-30 DIAGNOSIS — R7303 Prediabetes: Secondary | ICD-10-CM | POA: Diagnosis not present

## 2017-05-30 DIAGNOSIS — Z23 Encounter for immunization: Secondary | ICD-10-CM | POA: Diagnosis not present

## 2017-05-30 DIAGNOSIS — I10 Essential (primary) hypertension: Secondary | ICD-10-CM | POA: Diagnosis not present

## 2017-05-30 DIAGNOSIS — E663 Overweight: Secondary | ICD-10-CM | POA: Diagnosis not present

## 2017-07-18 ENCOUNTER — Encounter: Payer: Self-pay | Admitting: Physician Assistant

## 2017-07-18 ENCOUNTER — Ambulatory Visit: Payer: BLUE CROSS/BLUE SHIELD | Admitting: Physician Assistant

## 2017-07-18 ENCOUNTER — Other Ambulatory Visit: Payer: Self-pay

## 2017-07-18 VITALS — BP 129/83 | HR 57 | Temp 98.2°F | Resp 16 | Ht 71.25 in | Wt 221.6 lb

## 2017-07-18 DIAGNOSIS — I10 Essential (primary) hypertension: Secondary | ICD-10-CM

## 2017-07-18 DIAGNOSIS — R5383 Other fatigue: Secondary | ICD-10-CM

## 2017-07-18 NOTE — Progress Notes (Signed)
Devin Ellison  MRN: [id number] DOB: 01-23-1956  PCP: Lajean Manes, MD  Subjective:  Pt is a pleasant 61 year old male PMH HTN presents to clinic for blood pressure management.   HTN -  Norvasc 10mg  and Losartan 100mg . Takes medications every morning. Norvasc 5mg  and losartan 100 mg. Today's blood pressure is 129/83.  Denies medication side effects. Denies chest pain, palpitations, headache, shob, leg swelling.  Does not need refills at this time.   He is making some good changes to his diet - trying to eat more whole grains, quinoa.   Exercise - Cardio several times a week. This is down from his old habit of 7 days/week. Too tired to exercise when he gets home from work.   Still c/o fatigue. Sleeping 8-10 hours a night. He has not yet seen sleep studies. His CMA girlfriend talked him out of it "she poo-poo'ed it". He can fall asleep at anytime. Never filled Modafinil.    Review of Systems  Constitutional: Positive for fatigue. Negative for activity change, appetite change, chills, diaphoresis and fever.  Respiratory: Negative for cough, chest tightness and shortness of breath.   Cardiovascular: Negative for chest pain and palpitations.  Gastrointestinal: Negative for abdominal pain, constipation, diarrhea, nausea and vomiting.  Psychiatric/Behavioral: Negative for sleep disturbance. The patient is not nervous/anxious.     Patient Active Problem List   Diagnosis Date Noted  . Essential hypertension 03/13/2017  . MDD (major depressive disorder), single episode 04/30/2013  . Suicidal ideation 04/30/2013    Current Outpatient Medications on File Prior to Visit  Medication Sig Dispense Refill  . amLODipine (NORVASC) 10 MG tablet Take 1 tablet (10 mg total) by mouth daily. 90 tablet 3  . ARIPiprazole (ABILIFY) 10 MG tablet Take 1 tablet (10 mg total) by mouth daily. For Bipolar Mood disorder. (Patient taking differently: Take 5 mg by mouth daily. For Bipolar Mood disorder.) 30  tablet 1  . Armodafinil 150 MG tablet Take 1 tablet (150 mg total) by mouth daily. Take 1 hour before the start of your shift. 45 tablet 0  . lamoTRIgine (LAMICTAL) 200 MG tablet Take 1 tablet (200 mg total) by mouth daily. For mood disorder and depression, (Patient taking differently: Take 150 mg by mouth daily. For mood disorder and depression,) 30 tablet 0  . lithium 600 MG capsule Take 600 mg by mouth daily.    Marland Kitchen losartan (COZAAR) 100 MG tablet Office visit needed for additional refills. 2nd notice. 15 tablet 0  . losartan (COZAAR) 100 MG tablet Take 1 tablet (100 mg total) by mouth daily. 90 tablet 3  . VIAGRA 100 MG tablet TAKE 1/2 TO 1 TABLET BY MOUTH ONCE DAILY AS NEEDED FOR ERECTILE DYSFUNCTION (Patient not taking: Reported on 07/18/2017) 5 tablet 7   No current facility-administered medications on file prior to visit.     No Known Allergies   Objective:  BP 129/83   Pulse (!) 57   Temp 98.2 F (36.8 C) (Oral)   Resp 16   Ht 5' 11.25" (1.81 m)   Wt 221 lb 9.6 oz (100.5 kg)   SpO2 98%   BMI 30.69 kg/m   Physical Exam  Constitutional: He is oriented to person, place, and time and well-developed, well-nourished, and in no distress. No distress.  Neck: Carotid bruit is not present.  Cardiovascular: Normal rate, regular rhythm and normal heart sounds.  Neurological: He is alert and oriented to person, place, and time. GCS score is 15.  Skin: Skin is warm and dry.  Psychiatric: Mood, memory, affect and judgment normal.  Vitals reviewed.   Assessment and Plan :  1. Essential hypertension - Blood pressure is well controlled. Today is 129/83. He is making healthy changes to his diet and trying to lose weight. RTC in 6 months for recheck. Plan to check labs at that OV 2. Fatigue, unspecified type - Testosterone - Still c/o fatigue. He was talked out of his sleep study by his girlfriend. Impresses upon pt importance of treating sleep apnea and consequence of untreated OSA. He  had CPAP in early 2000's and did not like the mask. He agrees to have the study done.   Mercer Pod, PA-C  [facility] at [place] 07/18/2017 9:30 AM

## 2017-07-18 NOTE — Patient Instructions (Addendum)
We are checking your testosterone.  Please make appointment for your sleep study!! Come back and see me in 6 months for blood pressure recheck.   Lab Results  Component Value Date   TSH 3.160 02/07/2017  (normal is 0.45-4.50)  Try to shop mostly along the perimeter of the grocery store. Cut down consumption of processed foods.   The following foods are the foundation of a heart-healthy diet: Vegetables such as greens (spinach, collard greens, kale), broccoli, cabbage, carrots, bell peppers; stay away from starchy vegetables like potatoes, carrots, peas Fruits such as avocados, apples, berries, bananas, oranges, pears, grapes, and prunes  Whole grains such as plain oatmeal, brown rice, and whole-grain bread or tortillas  Fat-free or low-fat dairy foods such as milk, cheese, or yogurt  Protein-rich foods:  Fish high in omega-3 fatty acids, such as salmon, tuna, and trout, about 8 ounces a week  Lean meats such as 95 percent lean ground beef or pork tenderloin  Poultry such as skinless chicken or [place]  Eggs  Nuts, seeds, and soy products: quinoa, chia seeds Legumes such as kidney beans, lentils, chickpeas, black-eyed peas, and lima beans Oils and foods containing high levels of monounsaturated and polyunsaturated fats that can help lower blood cholesterol levels and the risk of cardiovascular disease. Some sources of these oils are:  Canola, corn, olive, safflower, sesame, sunflower, and soybean oils  Nuts such as walnuts, almonds, and pine nuts  Nut and seed butters  Salmon and trout  Seeds such as sesame, sunflower, pumpkin, or flax  Avocados  Tofu  Brussel sprouts - Cut off stems. Place in a mixing bowl that has a lid. Pour in a 1/4-1/2 cup olive oil, spices, use a light amount of parmesan. Place on a baking sheet. Bake for 10 minutes at 400F. Take it out, eat the brussel chips. Place for another 5-10 minutes.   Mashed cauliflower - Boil a bunch of cauliflower in a pot of water.  Blend in a food processor with 1-2 tablespoons of butter.  Spaghetti squash -  Cut the squash in half very carefully, clean out seeds from the middle. Place 1/2 face down in a microwave safe dish with at least 2 inches of water. Make 4-6 slits on outside of spaghetti squash and microwave for 10-12 minutes. Take out the spaghetti using a metal spoon. Repeat for the other half.   Vega protein is good protein powder, make sure you use ~6 ice cubes to give it smoothie consistency together with ~4-6 ounces of vanilla soy milk. Throw cinnamon into your shake, use peanut butter. You can also use the fruits listed above. Throw spinach or kale into the shake.   Recipe ideas: [employer], [employer], Lung, and [place], wholefoodsmarket.com  Limit added sugars When you follow a heart-healthy eating plan, you should limit the amount of calories you consume each day from added sugars. Because added sugars do not provide essential nutrients and are extra calories, limiting them can help you choose nutrient-rich foods and stay within your daily calorie limit. Some foods, such as fruit, contain natural sugars. Added sugars do not occur naturally in foods, but instead are used to sweeten foods and drinks. Some examples of added sugars include brown sugar, corn syrup, dextrose, fructose, glucose, high-fructose corn syrup, raw sugar, and sucrose. In the [place], sweetened drinks, snacks, and sweets are the major sources of added sugars. Sweetened drinks account for about half of all added sugars consumed. The following are  examples of foods and drinks with added sugars. Sweetened drinks include soft drinks or sodas, fruit drinks, sweetened coffee and tea, energy drinks, alcoholic drinks, and favored waters.  Snacks and sweets include grain-based desserts such as cakes, pies, cookies, brownies, doughnuts; dairy desserts such as ice cream, frozen desserts, and pudding; candies;  sugars; jams; syrups; and sweet toppings. To help you reduce the amount of added sugars in your diet: Choose unsweetened or whole fruits for snacks or dessert.  Choose drinks without added sugar such as water, low-fat or fat-free milk, or 100 percent fruit or vegetable juice.  Limit intake of sweetened drinks, snacks and desserts by eating them less often and in smaller amounts.  If you drink alcohol, you should limit your intake. Men should have no more than two alcoholic drinks per day. Women should have no more than one alcoholic drink per day. One drink is: 12 ounces of regular beer (5 percent alcohol)  5 ounces of wine (12 percent alcohol)  1 ounces of 80-proof liquor (40 percent alcohol)    IF you received an x-ray today, you will receive an invoice from [facility]. Please contact [facility] at [phone] with questions or concerns regarding your invoice.   IF you received labwork today, you will receive an invoice from [place]. Please contact [facility] at [phone] with questions or concerns regarding your invoice.   Our billing staff will not be able to assist you with questions regarding bills from these companies.  You will be contacted with the lab results as soon as they are available. The fastest way to get your results is to activate your My Chart account. Instructions are located on the last page of this paperwork. If you have not heard from [place] regarding the results in 2 weeks, please contact this office.

## 2017-07-19 LAB — TESTOSTERONE: Testosterone: 183 ng/dL — ABNORMAL LOW (ref 264–916)

## 2017-07-21 ENCOUNTER — Telehealth: Payer: Self-pay | Admitting: Student

## 2017-07-21 NOTE — Telephone Encounter (Signed)
Copied from [place] [id number]. Topic: Quick Communication - See Telephone Encounter >> Jul 21, 2017 12:30 PM Aurelio Brash B wrote: CRM for notification. See Telephone encounter for:  PT called asking for lab results. 07/21/17.

## 2017-07-24 ENCOUNTER — Other Ambulatory Visit: Payer: Self-pay | Admitting: Physician Assistant

## 2017-07-24 DIAGNOSIS — R5383 Other fatigue: Secondary | ICD-10-CM

## 2017-07-24 NOTE — Telephone Encounter (Signed)
Please review

## 2017-07-24 NOTE — Progress Notes (Signed)
Please call pt and let him know his testosterone was low.  We need follow-up lab work to make a definitive diagnosis. Please ask him to come in for a LAB ONLY visit. This *must* be done between 8-10 am ONLY.  Thank you!

## 2017-07-25 DIAGNOSIS — F3132 Bipolar disorder, current episode depressed, moderate: Secondary | ICD-10-CM | POA: Diagnosis not present

## 2017-07-31 DIAGNOSIS — Z5181 Encounter for therapeutic drug level monitoring: Secondary | ICD-10-CM | POA: Diagnosis not present

## 2017-08-01 ENCOUNTER — Ambulatory Visit (INDEPENDENT_AMBULATORY_CARE_PROVIDER_SITE_OTHER): Payer: BLUE CROSS/BLUE SHIELD | Admitting: Family Medicine

## 2017-08-01 DIAGNOSIS — R5383 Other fatigue: Secondary | ICD-10-CM

## 2017-08-01 NOTE — Progress Notes (Signed)
Lab only visit 

## 2017-08-02 LAB — TESTOSTERONE: Testosterone: 336 ng/dL (ref 264–916)

## 2017-08-05 ENCOUNTER — Encounter: Payer: Self-pay | Admitting: Physician Assistant

## 2017-08-05 NOTE — Progress Notes (Signed)
Please call pt and let him know his testosterone level is normal. There is no need for further testing.

## 2017-08-19 NOTE — Progress Notes (Signed)
Letter sent.

## 2017-09-12 DIAGNOSIS — F3132 Bipolar disorder, current episode depressed, moderate: Secondary | ICD-10-CM | POA: Diagnosis not present

## 2017-10-24 DIAGNOSIS — F3110 Bipolar disorder, current episode manic without psychotic features, unspecified: Secondary | ICD-10-CM | POA: Diagnosis not present

## 2017-11-13 DIAGNOSIS — Z79899 Other long term (current) drug therapy: Secondary | ICD-10-CM | POA: Diagnosis not present

## 2017-11-13 DIAGNOSIS — Z0000 Encounter for general adult medical examination without abnormal findings: Secondary | ICD-10-CM | POA: Diagnosis not present

## 2017-11-13 DIAGNOSIS — I10 Essential (primary) hypertension: Secondary | ICD-10-CM | POA: Diagnosis not present

## 2017-11-20 ENCOUNTER — Other Ambulatory Visit: Payer: Self-pay

## 2017-11-20 ENCOUNTER — Ambulatory Visit: Payer: BLUE CROSS/BLUE SHIELD | Admitting: Family Medicine

## 2017-11-20 ENCOUNTER — Encounter: Payer: Self-pay | Admitting: Family Medicine

## 2017-11-20 VITALS — BP 110/62 | HR 71 | Temp 98.3°F | Resp 18 | Ht 71.25 in | Wt 218.2 lb

## 2017-11-20 DIAGNOSIS — H6012 Cellulitis of left external ear: Secondary | ICD-10-CM | POA: Diagnosis not present

## 2017-11-20 MED ORDER — MUPIROCIN 2 % EX OINT: 1.0000 "application " | TOPICAL_OINTMENT | Freq: Three times a day (TID) | CUTANEOUS | 0 refills | Status: DC

## 2017-11-20 MED ORDER — DOXYCYCLINE HYCLATE 100 MG PO TABS: 100.0000 mg | ORAL_TABLET | Freq: Two times a day (BID) | ORAL | 0 refills | Status: DC

## 2017-11-20 NOTE — Patient Instructions (Addendum)
There appears to be a slight infection of the external ear. Start oral antibiotic twice per day, cleanse area with soap and water 2-3 times per day, and apply Bactroban ointment 3 times per day. If any spread of redness or worsening symptoms, please return for recheck.  I expect that to be improving this week. Let me know if there are questions.   Cellulitis, Adult Cellulitis is a skin infection. The infected area is usually red and tender. This condition occurs most often in the arms and lower legs. The infection can travel to the muscles, blood, and underlying tissue and become serious. It is very important to get treated for this condition. What are the causes? Cellulitis is caused by bacteria. The bacteria enter through a break in the skin, such as a cut, burn, insect bite, open sore, or crack. What increases the risk? This condition is more likely to occur in people who:  Have a weak defense system (immune system).  Have open wounds on the skin such as cuts, burns, bites, and scrapes. Bacteria can enter the body through these open wounds.  Are older.  Have diabetes.  Have a type of long-lasting (chronic) liver disease (cirrhosis) or kidney disease.  Use IV drugs.  What are the signs or symptoms? Symptoms of this condition include:  Redness, streaking, or spotting on the skin.  Swollen area of the skin.  Tenderness or pain when an area of the skin is touched.  Warm skin.  Fever.  Chills.  Blisters.  How is this diagnosed? This condition is diagnosed based on a medical history and physical exam. You may also have tests, including:  Blood tests.  Lab tests.  Imaging tests.  How is this treated? Treatment for this condition may include:  Medicines, such as antibiotic medicines or antihistamines.  Supportive care, such as rest and application of cold or warm cloths (cold or warm compresses) to the skin.  Hospital care, if the condition is severe.  The  infection usually gets better within 1-2 days of treatment. Follow these instructions at home:  Take over-the-counter and prescription medicines only as told by your health care provider.  If you were prescribed an antibiotic medicine, take it as told by your health care provider. Do not stop taking the antibiotic even if you start to feel better.  Drink enough fluid to keep your urine clear or pale yellow.  Do not touch or rub the infected area.  Raise (elevate) the infected area above the level of your heart while you are sitting or lying down.  Apply warm or cold compresses to the affected area as told by your health care provider.  Keep all follow-up visits as told by your health care provider. This is important. These visits let your health care provider make sure a more serious infection is not developing. Contact a health care provider if:  You have a fever.  Your symptoms do not improve within 1-2 days of starting treatment.  Your bone or joint underneath the infected area becomes painful after the skin has healed.  Your infection returns in the same area or another area.  You notice a swollen bump in the infected area.  You develop new symptoms.  You have a general ill feeling (malaise) with muscle aches and pains. Get help right away if:  Your symptoms get worse.  You feel very sleepy.  You develop vomiting or diarrhea that persists.  You notice red streaks coming from the infected area.  Your  red area gets larger or turns dark in color. This information is not intended to replace advice given to you by your health care provider. Make sure you discuss any questions you have with your health care provider. Document Released: 06/01/2005 Document Revised: 12/31/2015 Document Reviewed: 07/01/2015 Elsevier Interactive Patient Education  2018 [employer].      IF you received an x-ray today, you will receive an invoice from [facility] Radiology. Please contact  [facility] at [phone] with questions or concerns regarding your invoice.   IF you received labwork today, you will receive an invoice from [place]. Please contact [facility] at [phone] with questions or concerns regarding your invoice.   Our billing staff will not be able to assist you with questions regarding bills from these companies.  You will be contacted with the lab results as soon as they are available. The fastest way to get your results is to activate your My Chart account. Instructions are located on the last page of this paperwork. If you have not heard from [place] regarding the results in 2 weeks, please contact this office.

## 2017-11-20 NOTE — Progress Notes (Signed)
Subjective:  By signing my name below, I, [name], attest that this documentation has been prepared under the direction and in the presence of [name], MD Electronically Signed: Ladene Artist, ED Scribe 11/20/2017 at 3:09 PM.   Patient ID: Devin Ellison, male    DOB: 1956/01/17, 62 y.o.   MRN: [id number]  Chief Complaint  Patient presents with  . Ear Pain    left ear pain its burning and has drainage had it a couple months ago and came back    HPI Devin Ellison is a 62 y.o. male who presents to Primary Care at [facility] complaining of L ear pain and clear discharge for the past 2 days. Pt states that he trims the hair in his ears with an electric razor every few days. He has noticed a burning sensation to his L ear and clear drainage since, which he states he experienced several months ago as well. Symptoms resolved previously with ointment to the outside of the ear.  Patient Active Problem List   Diagnosis Date Noted  . Essential hypertension 03/13/2017  . MDD (major depressive disorder), single episode 04/30/2013  . Suicidal ideation 04/30/2013   Past Medical History:  Diagnosis Date  . Depression    Past Surgical History:  Procedure Laterality Date  . ANKLE SURGERY    . COLON SURGERY    . HAND SURGERY     No Known Allergies Prior to Admission medications   Medication Sig Start Date End Date Taking? Authorizing Provider  amLODipine (NORVASC) 10 MG tablet Take 1 tablet (10 mg total) by mouth daily. 04/11/17   McVey, Gelene Mink, PA-C  ARIPiprazole (ABILIFY) 10 MG tablet Take 1 tablet (10 mg total) by mouth daily. For Bipolar Mood disorder. Patient taking differently: Take 5 mg by mouth daily. For Bipolar Mood disorder. 05/06/13   Ruben Im, PA-C  Armodafinil 150 MG tablet Take 1 tablet (150 mg total) by mouth daily. Take 1 hour before the start of your shift. 04/11/17   McVey, Gelene Mink, PA-C  lamoTRIgine (LAMICTAL) 200 MG tablet Take 1  tablet (200 mg total) by mouth daily. For mood disorder and depression, Patient taking differently: Take 150 mg by mouth daily. For mood disorder and depression, 05/06/13   Nena Polio T, PA-C  lithium 600 MG capsule Take 600 mg by mouth daily.    [provider]  losartan (COZAAR) 100 MG tablet Office visit needed for additional refills. 2nd notice. 02/07/17   McVey, Gelene Mink, PA-C  losartan (COZAAR) 100 MG tablet Take 1 tablet (100 mg total) by mouth daily. 03/13/17   McVey, Gelene Mink, PA-C  VIAGRA 100 MG tablet TAKE 1/2 TO 1 TABLET BY MOUTH ONCE DAILY AS NEEDED FOR ERECTILE DYSFUNCTION Patient not taking: Reported on 07/18/2017 01/15/16   Robyn Haber, MD   Social History   Socioeconomic History  . Marital status: Married    Spouse name: Not on file  . Number of children: Not on file  . Years of education: Not on file  . Highest education level: Not on file  Social Needs  . Financial resource strain: Not on file  . Food insecurity - worry: Not on file  . Food insecurity - inability: Not on file  . Transportation needs - medical: Not on file  . Transportation needs - non-medical: Not on file  Occupational History  . Not on file  Tobacco Use  . Smoking status: Former Smoker    Packs/day: 1.00  Types: Cigarettes  . Smokeless tobacco: Never Used  Substance and Sexual Activity  . Alcohol use: Yes    Alcohol/week: 0.0 oz    Comment: social  . Drug use: No  . Sexual activity: Not on file  Other Topics Concern  . Not on file  Social History Narrative  . Not on file   Review of Systems  HENT: Positive for ear discharge and ear pain.       Objective:   Physical Exam  Constitutional: He is oriented to person, place, and time. He appears well-developed and well-nourished. No distress.  HENT:  Head: Normocephalic and atraumatic.  Right Ear: Tympanic membrane, external ear and ear canal normal.  Left Ear: Tympanic membrane and ear canal normal.    Nose: No rhinorrhea.  Mouth/Throat: Oropharynx is clear and moist and mucous membranes are normal. No oropharyngeal exudate or posterior oropharyngeal erythema.  L ear external pinna at the helix and antihelix there is a slight wet appearance, minimal erythema and soft tissue swelling at the mid aspect only. Slight induration. Faint honey-colored crust on anterior aspect of the helix. Canals overall clear, minimal cerumen. No facial rash or vesicles seen elsewhere.  Eyes: Conjunctivae and EOM are normal. Pupils are equal, round, and reactive to light.  Neck: Neck supple. No tracheal deviation present.  Cardiovascular: Normal rate, regular rhythm, normal heart sounds and intact distal pulses.  No murmur heard. Pulmonary/Chest: Effort normal and breath sounds normal. No respiratory distress. He has no wheezes. He has no rhonchi. He has no rales.  Abdominal: Soft. There is no tenderness.  Musculoskeletal: Normal range of motion.  Lymphadenopathy:    He has no cervical adenopathy.  Neurological: He is alert and oriented to person, place, and time.  Skin: Skin is warm and dry. No rash noted.  Psychiatric: He has a normal mood and affect. His behavior is normal.  Nursing note and vitals reviewed.  Vitals:   11/20/17 1449  BP: 110/62  Pulse: 71  Resp: 18  Temp: 98.3 F (36.8 C)  TempSrc: Oral  SpO2: 96%  Weight: 218 lb 3.2 oz (99 kg)  Height: 5' 11.25" (1.81 m)      Assessment & Plan:    Devin Ellison is a 62 y.o. male Cellulitis of left ear - Plan: mupirocin ointment (BACTROBAN) 2 %, doxycycline (VIBRA-TABS) 100 MG tablet, WOUND CULTURE  -early cellulitis, possible impetigo with faint crusting area.   -check wound culture, start Bactroban ointment 3 times a day for 1 week, doxycycline twice a day for 1 week, potential side effects discussed, RTC precautions if worsening or not improving  Meds ordered this encounter  Medications  . mupirocin ointment (BACTROBAN) 2 %    Sig:  Apply 1 application topically 3 (three) times daily. For 1 week    Dispense:  22 g    Refill:  0  . doxycycline (VIBRA-TABS) 100 MG tablet    Sig: Take 1 tablet (100 mg total) by mouth 2 (two) times daily.    Dispense:  14 tablet    Refill:  0   Patient Instructions    There appears to be a slight infection of the external ear. Start oral antibiotic twice per day, cleanse area with soap and water 2-3 times per day, and apply Bactroban ointment 3 times per day. If any spread of redness or worsening symptoms, please return for recheck.  I expect that to be improving this week. Let me know if there are questions.  Cellulitis, Adult Cellulitis is a skin infection. The infected area is usually red and tender. This condition occurs most often in the arms and lower legs. The infection can travel to the muscles, blood, and underlying tissue and become serious. It is very important to get treated for this condition. What are the causes? Cellulitis is caused by bacteria. The bacteria enter through a break in the skin, such as a cut, burn, insect bite, open sore, or crack. What increases the risk? This condition is more likely to occur in people who:  Have a weak defense system (immune system).  Have open wounds on the skin such as cuts, burns, bites, and scrapes. Bacteria can enter the body through these open wounds.  Are older.  Have diabetes.  Have a type of long-lasting (chronic) liver disease (cirrhosis) or kidney disease.  Use IV drugs.  What are the signs or symptoms? Symptoms of this condition include:  Redness, streaking, or spotting on the skin.  Swollen area of the skin.  Tenderness or pain when an area of the skin is touched.  Warm skin.  Fever.  Chills.  Blisters.  How is this diagnosed? This condition is diagnosed based on a medical history and physical exam. You may also have tests, including:  Blood tests.  Lab tests.  Imaging tests.  How is this  treated? Treatment for this condition may include:  Medicines, such as antibiotic medicines or antihistamines.  Supportive care, such as rest and application of cold or warm cloths (cold or warm compresses) to the skin.  Hospital care, if the condition is severe.  The infection usually gets better within 1-2 days of treatment. Follow these instructions at home:  Take over-the-counter and prescription medicines only as told by your health care provider.  If you were prescribed an antibiotic medicine, take it as told by your health care provider. Do not stop taking the antibiotic even if you start to feel better.  Drink enough fluid to keep your urine clear or pale yellow.  Do not touch or rub the infected area.  Raise (elevate) the infected area above the level of your heart while you are sitting or lying down.  Apply warm or cold compresses to the affected area as told by your health care provider.  Keep all follow-up visits as told by your health care provider. This is important. These visits let your health care provider make sure a more serious infection is not developing. Contact a health care provider if:  You have a fever.  Your symptoms do not improve within 1-2 days of starting treatment.  Your bone or joint underneath the infected area becomes painful after the skin has healed.  Your infection returns in the same area or another area.  You notice a swollen bump in the infected area.  You develop new symptoms.  You have a general ill feeling (malaise) with muscle aches and pains. Get help right away if:  Your symptoms get worse.  You feel very sleepy.  You develop vomiting or diarrhea that persists.  You notice red streaks coming from the infected area.  Your red area gets larger or turns dark in color. This information is not intended to replace advice given to you by your health care provider. Make sure you discuss any questions you have with your health  care provider. Document Released: 06/01/2005 Document Revised: 12/31/2015 Document Reviewed: 07/01/2015 Elsevier Interactive Patient Education  2018 [employer].      IF you received an  x-ray today, you will receive an invoice from [facility]. Please contact [facility] Radiology at [phone] with questions or concerns regarding your invoice.   IF you received labwork today, you will receive an invoice from [place]. Please contact [facility] at [phone] with questions or concerns regarding your invoice.   Our billing staff will not be able to assist you with questions regarding bills from these companies.  You will be contacted with the lab results as soon as they are available. The fastest way to get your results is to activate your My Chart account. Instructions are located on the last page of this paperwork. If you have not heard from [place] regarding the results in 2 weeks, please contact this office.      I personally performed the services described in this documentation, which was scribed in my presence. The recorded information has been reviewed and considered for accuracy and completeness, addended by me as needed, and agree with information above.  Signed,   Merri Ray, MD [facility] at [place].  11/20/17 3:24 PM

## 2017-11-22 LAB — WOUND CULTURE: ORGANISM ID, BACTERIA: NONE SEEN

## 2017-12-19 DIAGNOSIS — F3111 Bipolar disorder, current episode manic without psychotic features, mild: Secondary | ICD-10-CM | POA: Diagnosis not present

## 2018-01-09 ENCOUNTER — Ambulatory Visit: Payer: BLUE CROSS/BLUE SHIELD | Admitting: Physician Assistant

## 2018-01-09 ENCOUNTER — Encounter: Payer: Self-pay | Admitting: Physician Assistant

## 2018-01-09 DIAGNOSIS — I10 Essential (primary) hypertension: Secondary | ICD-10-CM | POA: Diagnosis not present

## 2018-01-09 MED ORDER — AMLODIPINE BESYLATE 10 MG PO TABS: 10.0000 mg | ORAL_TABLET | Freq: Every day | ORAL | 3 refills | Status: DC

## 2018-01-09 MED ORDER — LOSARTAN POTASSIUM 100 MG PO TABS: 100.0000 mg | ORAL_TABLET | Freq: Every day | ORAL | 3 refills | Status: DC

## 2018-01-09 NOTE — Patient Instructions (Addendum)
Consider having your annual exam done here next year.  See if you can have you lab results from [facility] sent to our office.   Continue your healthy lifestyle! DASH Eating Plan DASH stands for "Dietary Approaches to Stop Hypertension." The DASH eating plan is a healthy eating plan that has been shown to reduce high blood pressure (hypertension). It may also reduce your risk for type 2 diabetes, heart disease, and stroke. The DASH eating plan may also help with weight loss. What are tips for following this plan? General guidelines  Avoid eating more than 2,300 mg (milligrams) of salt (sodium) a day. If you have hypertension, you may need to reduce your sodium intake to 1,500 mg a day.  Limit alcohol intake to no more than 1 drink a day for nonpregnant women and 2 drinks a day for men. One drink equals 12 oz of beer, 5 oz of wine, or 1 oz of hard liquor.  Work with your health care provider to maintain a healthy body weight or to lose weight. Ask what an ideal weight is for you.  Get at least 30 minutes of exercise that causes your heart to beat faster (aerobic exercise) most days of the week. Activities may include walking, swimming, or biking.  Work with your health care provider or diet and nutrition specialist (dietitian) to adjust your eating plan to your individual calorie needs. Reading food labels  Check food labels for the amount of sodium per serving. Choose foods with less than 5 percent of the Daily Value of sodium. Generally, foods with less than 300 mg of sodium per serving fit into this eating plan.  To find whole grains, look for the word "whole" as the first word in the ingredient list. Shopping  Buy products labeled as "low-sodium" or "no salt added."  Buy fresh foods. Avoid canned foods and premade or frozen meals. Cooking  Avoid adding salt when cooking. Use salt-free seasonings or herbs instead of table salt or sea salt. Check with your health care provider or  pharmacist before using salt substitutes.  Do not fry foods. Cook foods using healthy methods such as baking, boiling, grilling, and broiling instead.  Cook with heart-healthy oils, such as olive, canola, soybean, or sunflower oil. Meal planning   Eat a balanced diet that includes: ? 5 or more servings of fruits and vegetables each day. At each meal, try to fill half of your plate with fruits and vegetables. ? Up to 6-8 servings of whole grains each day. ? Less than 6 oz of lean meat, poultry, or fish each day. A 3-oz serving of meat is about the same size as a deck of cards. One egg equals 1 oz. ? 2 servings of low-fat dairy each day. ? A serving of nuts, seeds, or beans 5 times each week. ? Heart-healthy fats. Healthy fats called Omega-3 fatty acids are found in foods such as flaxseeds and coldwater fish, like sardines, salmon, and mackerel.  Limit how much you eat of the following: ? Canned or prepackaged foods. ? Food that is high in trans fat, such as fried foods. ? Food that is high in saturated fat, such as fatty meat. ? Sweets, desserts, sugary drinks, and other foods with added sugar. ? Full-fat dairy products.  Do not salt foods before eating.  Try to eat at least 2 vegetarian meals each week.  Eat more home-cooked food and less restaurant, buffet, and fast food.  When eating at a restaurant, ask that your  food be prepared with less salt or no salt, if possible. What foods are recommended? The items listed may not be a complete list. Talk with your dietitian about what dietary choices are best for you. Grains Whole-grain or whole-wheat bread. Whole-grain or whole-wheat pasta. Brown rice. [place]. Bulgur. Whole-grain and low-sodium cereals. Pita bread. Low-fat, low-sodium crackers. Whole-wheat flour tortillas. Vegetables Fresh or frozen vegetables (raw, steamed, roasted, or grilled). Low-sodium or reduced-sodium tomato and vegetable juice. Low-sodium or  reduced-sodium tomato sauce and tomato paste. Low-sodium or reduced-sodium canned vegetables. Fruits All fresh, dried, or frozen fruit. Canned fruit in natural juice (without added sugar). Meat and other protein foods Skinless chicken or [place]. Ground chicken or [place]. Pork with fat trimmed off. Fish and seafood. Egg whites. Dried beans, peas, or lentils. Unsalted nuts, nut butters, and seeds. Unsalted canned beans. Lean cuts of beef with fat trimmed off. Low-sodium, lean deli meat. Dairy Low-fat (1%) or fat-free (skim) milk. Fat-free, low-fat, or reduced-fat cheeses. Nonfat, low-sodium ricotta or cottage cheese. Low-fat or nonfat yogurt. Low-fat, low-sodium cheese. Fats and oils Soft margarine without trans fats. Vegetable oil. Low-fat, reduced-fat, or light mayonnaise and salad dressings (reduced-sodium). Canola, safflower, olive, soybean, and sunflower oils. Avocado. Seasoning and other foods Herbs. Spices. Seasoning mixes without salt. Unsalted popcorn and pretzels. Fat-free sweets. What foods are not recommended? The items listed may not be a complete list. Talk with your dietitian about what dietary choices are best for you. Grains Baked goods made with fat, such as croissants, muffins, or some breads. Dry pasta or rice meal packs. Vegetables Creamed or fried vegetables. Vegetables in a cheese sauce. Regular canned vegetables (not low-sodium or reduced-sodium). Regular canned tomato sauce and paste (not low-sodium or reduced-sodium). Regular tomato and vegetable juice (not low-sodium or reduced-sodium). Angie Fava. Olives. Fruits Canned fruit in a light or heavy syrup. Fried fruit. Fruit in cream or butter sauce. Meat and other protein foods Fatty cuts of meat. Ribs. Fried meat. Berniece Salines. Sausage. Bologna and other processed lunch meats. Salami. Fatback. Hotdogs. Bratwurst. Salted nuts and seeds. Canned beans with added salt. Canned or smoked fish. Whole eggs or egg yolks. Chicken or [place]  with skin. Dairy Whole or 2% milk, cream, and half-and-half. Whole or full-fat cream cheese. Whole-fat or sweetened yogurt. Full-fat cheese. Nondairy creamers. Whipped toppings. Processed cheese and cheese spreads. Fats and oils Butter. Stick margarine. Lard. Shortening. Ghee. Bacon fat. Tropical oils, such as coconut, palm kernel, or palm oil. Seasoning and other foods Salted popcorn and pretzels. Onion salt, garlic salt, seasoned salt, table salt, and sea salt. Worcestershire sauce. Tartar sauce. Barbecue sauce. Teriyaki sauce. Soy sauce, including reduced-sodium. Steak sauce. Canned and packaged gravies. Fish sauce. Oyster sauce. Cocktail sauce. Horseradish that you find on the shelf. Ketchup. Mustard. Meat flavorings and tenderizers. Bouillon cubes. Hot sauce and Tabasco sauce. Premade or packaged marinades. Premade or packaged taco seasonings. Relishes. Regular salad dressings. Where to find more information:  National Heart, Lung, and [place]: https://wilson-eaton.com/  American Heart Association: www.heart.org Summary  The DASH eating plan is a healthy eating plan that has been shown to reduce high blood pressure (hypertension). It may also reduce your risk for type 2 diabetes, heart disease, and stroke.  With the DASH eating plan, you should limit salt (sodium) intake to 2,300 mg a day. If you have hypertension, you may need to reduce your sodium intake to 1,500 mg a day.  When on the DASH eating plan, aim to eat more fresh fruits and vegetables,  whole grains, lean proteins, low-fat dairy, and heart-healthy fats.  Work with your health care provider or diet and nutrition specialist (dietitian) to adjust your eating plan to your individual calorie needs. This information is not intended to replace advice given to you by your health care provider. Make sure you discuss any questions you have with your health care provider. Document Released: 08/11/2011 Document Revised: 08/15/2016  Document Reviewed: 08/15/2016 Elsevier Interactive Patient Education  2018 [employer].   IF you received an x-ray today, you will receive an invoice from [facility]. Please contact [facility] at [phone] with questions or concerns regarding your invoice.   IF you received labwork today, you will receive an invoice from [facility]. Please contact [facility] at [phone] with questions or concerns regarding your invoice.   Our billing staff will not be able to assist you with questions regarding bills from these companies.  You will be contacted with the lab results as soon as they are available. The fastest way to get your results is to activate your My Chart account. Instructions are located on the last page of this paperwork. If you have not heard from [place] regarding the results in 2 weeks, please contact this office.

## 2018-01-09 NOTE — Progress Notes (Signed)
PRAVIN PEREZPEREZ  MRN: [id number] DOB: 04/05/1956  PCP: Lajean Manes, MD  Subjective:  Pt is a 62 year old male who presents to clinic for f/u HTN.   HTN - Norvasc 10mg  and Losartan 100mg . Takes medications every morning. Norvasc 5mg  and losartan 100 mg. Today's blood pressure is 115/79. He is feeling well today. denies medication side effects.  Home blood pressures are around 118/74. Walks for about 1 hour in the evening. Goes to [employer]. Cooks at home, avoids fried foods.   Dr. Felipa Eth is PCP at [facility] at [place]. Labs earlier this year for his annual exam were wnl.   Review of Systems  Constitutional: Negative for chills and diaphoresis.  Respiratory: Negative for cough, chest tightness, shortness of breath and wheezing.   Cardiovascular: Negative for chest pain, palpitations and leg swelling.  Gastrointestinal: Negative for diarrhea, nausea and vomiting.  Musculoskeletal: Negative for neck pain.  Neurological: Negative for dizziness, syncope, light-headedness and headaches.  Psychiatric/Behavioral: Negative for sleep disturbance. The patient is not nervous/anxious.     Patient Active Problem List   Diagnosis Date Noted  . Essential hypertension 03/13/2017  . MDD (major depressive disorder), single episode 04/30/2013  . Suicidal ideation 04/30/2013    Current Outpatient Medications on File Prior to Visit  Medication Sig Dispense Refill  . amLODipine (NORVASC) 10 MG tablet Take 1 tablet (10 mg total) by mouth daily. 90 tablet 3  . lamoTRIgine (LAMICTAL) 200 MG tablet Take 1 tablet (200 mg total) by mouth daily. For mood disorder and depression, (Patient taking differently: Take 150 mg by mouth daily. For mood disorder and depression,) 30 tablet 0  . lithium 600 MG capsule Take 1,200 mg by mouth daily.     Marland Kitchen losartan (COZAAR) 100 MG tablet Take 1 tablet (100 mg total) by mouth daily. 90 tablet 3  . OLANZapine (ZYPREXA) 5 MG tablet Take 5 mg by mouth at  bedtime.  0  . VIAGRA 100 MG tablet TAKE 1/2 TO 1 TABLET BY MOUTH ONCE DAILY AS NEEDED FOR ERECTILE DYSFUNCTION 5 tablet 7   No current facility-administered medications on file prior to visit.     No Known Allergies   Objective:  BP 115/79   Pulse 85   Temp 98.5 F (36.9 C) (Oral)   Resp 17   Ht 5\' 11"  (1.803 m)   Wt 224 lb (101.6 kg)   SpO2 98%   BMI 31.24 kg/m   Physical Exam  Constitutional: He is oriented to person, place, and time. He appears well-developed and well-nourished.  Cardiovascular: Normal rate and regular rhythm.  Pulmonary/Chest: No respiratory distress.  Neurological: He is alert and oriented to person, place, and time.  Skin: Skin is warm and dry.  Psychiatric: He has a normal mood and affect. His behavior is normal. Judgment and thought content normal.  Vitals reviewed.   Lab Results  Component Value Date   CHOL 186 02/07/2017   HDL 82 02/07/2017   LDLCALC 88 02/07/2017   TRIG 80 02/07/2017   CHOLHDL 2.3 02/07/2017    Assessment and Plan :  1. Essential hypertension - amLODipine (NORVASC) 10 MG tablet; Take 1 tablet (10 mg total) by mouth daily.  Dispense: 90 tablet; Refill: 3 - losartan (COZAAR) 100 MG tablet; Take 1 tablet (100 mg total) by mouth daily.  Dispense: 90 tablet; Refill: 3 - pt presents for f/u HTN. Denies medication side effects. He is feeling well today. Blood pressure is 115/79. con't healthy lifestyle.  He gets his blood work done with Dr. Felipa Eth, PCP at [facility] at [place].   Mercer Pod, PA-C  [facility] at [place] 01/09/2018 11:44 AM

## 2018-01-10 LAB — LIPID PANEL
Cholesterol: 178 (ref 0–200)
HDL: 118 — AB (ref 35–70)
LDL CALC: 85
Triglycerides: 164 — AB (ref 40–160)

## 2018-01-10 LAB — BASIC METABOLIC PANEL
BUN: 14 (ref 4–21)
Creatinine: 1.1 (ref 0.6–1.3)
GLUCOSE: 110
Potassium: 4.1 (ref 3.4–5.3)
SODIUM: 143 (ref 137–147)

## 2018-01-10 LAB — HEPATIC FUNCTION PANEL
ALT: 8 — AB (ref 10–40)
AST: 10 — AB (ref 14–40)

## 2018-01-10 LAB — PSA: PSA: 1.54

## 2018-01-10 LAB — CBC AND DIFFERENTIAL
HCT: 43 (ref 41–53)
HEMOGLOBIN: 14.6 (ref 13.5–17.5)
WBC: 8

## 2018-01-10 LAB — MICROALBUMIN, URINE: Microalb, Ur: <0.7

## 2018-02-13 DIAGNOSIS — F3111 Bipolar disorder, current episode manic without psychotic features, mild: Secondary | ICD-10-CM | POA: Diagnosis not present

## 2018-02-19 DIAGNOSIS — Z5181 Encounter for therapeutic drug level monitoring: Secondary | ICD-10-CM | POA: Diagnosis not present

## 2018-02-20 DIAGNOSIS — F3111 Bipolar disorder, current episode manic without psychotic features, mild: Secondary | ICD-10-CM | POA: Diagnosis not present

## 2018-02-20 DIAGNOSIS — F411 Generalized anxiety disorder: Secondary | ICD-10-CM | POA: Diagnosis not present

## 2018-03-27 DIAGNOSIS — F3111 Bipolar disorder, current episode manic without psychotic features, mild: Secondary | ICD-10-CM | POA: Diagnosis not present

## 2018-04-05 ENCOUNTER — Encounter: Payer: Self-pay | Admitting: *Deleted

## 2018-04-23 DIAGNOSIS — F411 Generalized anxiety disorder: Secondary | ICD-10-CM | POA: Diagnosis not present

## 2018-04-23 DIAGNOSIS — F3131 Bipolar disorder, current episode depressed, mild: Secondary | ICD-10-CM | POA: Diagnosis not present

## 2018-04-23 DIAGNOSIS — F331 Major depressive disorder, recurrent, moderate: Secondary | ICD-10-CM | POA: Diagnosis not present

## 2018-05-01 DIAGNOSIS — F3111 Bipolar disorder, current episode manic without psychotic features, mild: Secondary | ICD-10-CM | POA: Diagnosis not present

## 2018-05-08 DIAGNOSIS — H61002 Unspecified perichondritis of left external ear: Secondary | ICD-10-CM | POA: Diagnosis not present

## 2018-05-08 DIAGNOSIS — F411 Generalized anxiety disorder: Secondary | ICD-10-CM | POA: Diagnosis not present

## 2018-05-08 DIAGNOSIS — F331 Major depressive disorder, recurrent, moderate: Secondary | ICD-10-CM | POA: Diagnosis not present

## 2018-05-08 DIAGNOSIS — D692 Other nonthrombocytopenic purpura: Secondary | ICD-10-CM | POA: Diagnosis not present

## 2018-05-08 DIAGNOSIS — L570 Actinic keratosis: Secondary | ICD-10-CM | POA: Diagnosis not present

## 2018-05-08 DIAGNOSIS — F3131 Bipolar disorder, current episode depressed, mild: Secondary | ICD-10-CM | POA: Diagnosis not present

## 2018-05-08 DIAGNOSIS — D485 Neoplasm of uncertain behavior of skin: Secondary | ICD-10-CM | POA: Diagnosis not present

## 2018-05-21 DIAGNOSIS — E669 Obesity, unspecified: Secondary | ICD-10-CM | POA: Diagnosis not present

## 2018-05-21 DIAGNOSIS — Z79899 Other long term (current) drug therapy: Secondary | ICD-10-CM | POA: Diagnosis not present

## 2018-05-21 DIAGNOSIS — Z23 Encounter for immunization: Secondary | ICD-10-CM | POA: Diagnosis not present

## 2018-05-21 DIAGNOSIS — I10 Essential (primary) hypertension: Secondary | ICD-10-CM | POA: Diagnosis not present

## 2018-06-05 DIAGNOSIS — F331 Major depressive disorder, recurrent, moderate: Secondary | ICD-10-CM | POA: Diagnosis not present

## 2018-06-05 DIAGNOSIS — F411 Generalized anxiety disorder: Secondary | ICD-10-CM | POA: Diagnosis not present

## 2018-06-05 DIAGNOSIS — F3131 Bipolar disorder, current episode depressed, mild: Secondary | ICD-10-CM | POA: Diagnosis not present

## 2018-06-26 DIAGNOSIS — F3111 Bipolar disorder, current episode manic without psychotic features, mild: Secondary | ICD-10-CM | POA: Diagnosis not present

## 2018-07-03 DIAGNOSIS — F3131 Bipolar disorder, current episode depressed, mild: Secondary | ICD-10-CM | POA: Diagnosis not present

## 2018-07-03 DIAGNOSIS — F331 Major depressive disorder, recurrent, moderate: Secondary | ICD-10-CM | POA: Diagnosis not present

## 2018-07-03 DIAGNOSIS — F411 Generalized anxiety disorder: Secondary | ICD-10-CM | POA: Diagnosis not present

## 2018-07-31 DIAGNOSIS — F3131 Bipolar disorder, current episode depressed, mild: Secondary | ICD-10-CM | POA: Diagnosis not present

## 2018-07-31 DIAGNOSIS — F331 Major depressive disorder, recurrent, moderate: Secondary | ICD-10-CM | POA: Diagnosis not present

## 2018-07-31 DIAGNOSIS — F411 Generalized anxiety disorder: Secondary | ICD-10-CM | POA: Diagnosis not present

## 2018-08-06 ENCOUNTER — Encounter: Payer: Self-pay | Admitting: Neurology

## 2018-08-09 NOTE — Progress Notes (Signed)
Subjective:   Devin Ellison was seen in consultation in the movement disorder clinic at the request of Lajean Manes, MD.  The evaluation is for tremor.  The records that were made available to me were reviewed but no notes about tremor were seen.  Tremor started approximately 8 months to 1 year ago and involves the bilateral UE.  Tremor is most noticeable when writing, using the mouse, brushing the teeth.   There is no family hx of tremor.    Affected by caffeine:  No. (drinks 2 sodas per day) Affected by alcohol:  No. (drinks 6 beers/week) Affected by stress:  Yes.   Affected by fatigue:  No. Spills soup if on spoon:  Yes.   Spills glass of liquid if full:  No. Affects ADL's (tying shoes, brushing teeth, etc):  Yes.    (especially brushing teeth - some days cannot do it)  Current/Previously tried tremor medications: n/a  Current medications that may exacerbate tremor:  Lithium (been on that since 2005 or so), zyprexa (previously abilify) - been on this since the summer  Outside reports reviewed: historical medical records, office notes and referral letter/letters.  No Known Allergies  Outpatient Encounter Medications as of 08/13/2018  Medication Sig  . amLODipine (NORVASC) 10 MG tablet Take 1 tablet (10 mg total) by mouth daily.  . busPIRone (BUSPAR) 10 MG tablet Take 10 mg by mouth 2 (two) times daily.  Marland Kitchen lamoTRIgine (LAMICTAL) 200 MG tablet Take 1 tablet (200 mg total) by mouth daily. For mood disorder and depression,  . lithium 600 MG capsule Take 600 mg by mouth 2 (two) times daily.   Marland Kitchen losartan (COZAAR) 100 MG tablet Take 1 tablet (100 mg total) by mouth daily.  . metFORMIN (GLUCOPHAGE) 500 MG tablet Take 500 mg by mouth 2 (two) times daily.  Marland Kitchen OLANZapine (ZYPREXA) 5 MG tablet Take 5 mg by mouth at bedtime.  Marland Kitchen VIAGRA 100 MG tablet TAKE 1/2 TO 1 TABLET BY MOUTH ONCE DAILY AS NEEDED FOR ERECTILE DYSFUNCTION   No facility-administered encounter medications on file as of  08/13/2018.     Past Medical History:  Diagnosis Date  . Bipolar 1 disorder ([place])    in remission  . Depression   . Hypertension     Past Surgical History:  Procedure Laterality Date  . ANKLE SURGERY Right   . COLON SURGERY     polypectomy  . HAND SURGERY Right    after fx    Social History   Socioeconomic History  . Marital status: Married    Spouse name: Not on file  . Number of children: Not on file  . Years of education: Not on file  . Highest education level: Not on file  Occupational History  . Not on file  Social Needs  . Financial resource strain: Not on file  . Food insecurity:    Worry: Not on file    Inability: Not on file  . Transportation needs:    Medical: Not on file    Non-medical: Not on file  Tobacco Use  . Smoking status: Former Smoker    Packs/day: 1.00    Types: Cigarettes    Last attempt to quit: 08/13/2013    Years since quitting: 5.0  . Smokeless tobacco: Never Used  Substance and Sexual Activity  . Alcohol use: Yes    Alcohol/week: 0.0 standard drinks    Comment: 6 beers/week  . Drug use: No  . Sexual activity: Not on file  Lifestyle  . Physical activity:    Days per week: Not on file    Minutes per session: Not on file  . Stress: Not on file  Relationships  . Social connections:    Talks on phone: Not on file    Gets together: Not on file    Attends religious service: Not on file    Active member of club or organization: Not on file    Attends meetings of clubs or organizations: Not on file    Relationship status: Not on file  . Intimate partner violence:    Fear of current or ex partner: Not on file    Emotionally abused: Not on file    Physically abused: Not on file    Forced sexual activity: Not on file  Other Topics Concern  . Not on file  Social History Narrative  . Not on file    Family Status  Relation Name Status  . Mother  Alive  . Father  Deceased       colon polyps liver disease  . Sister  Deceased        age 72 lymphoma  . Sister  Deceased       age 24 cerebral palsy  . Sister  Alive       breast cancer  . Sister  Alive  . Son  Alive  . Daughter  Alive    Review of Systems Review of Systems  Constitutional:       Weight gain with zyprexa  HENT: Negative.   Eyes: Negative.   Respiratory: Negative.   Cardiovascular: Negative.   Gastrointestinal: Negative.   Genitourinary: Negative.   Musculoskeletal: Negative.   Skin: Negative.   Endo/Heme/Allergies: Negative.   Psychiatric/Behavioral: Positive for depression.     Objective:   VITALS:   Vitals:   08/13/18 0950  BP: 116/76  Pulse: 70  SpO2: 98%  Weight: 235 lb (106.6 kg)  Height: 6' (1.829 m)   Gen:  Appears stated age and in NAD. HEENT:  Normocephalic, atraumatic. The mucous membranes are moist. The superficial temporal arteries are without ropiness or tenderness. Cardiovascular: Regular rate and rhythm. Lungs: Clear to auscultation bilaterally. Neck: There are no carotid bruits noted bilaterally.  NEUROLOGICAL:  Orientation:  The patient is alert and oriented x 3.  Recent and remote memory are intact.  Attention span and concentration are normal.  Able to name objects and repeat without trouble.  Fund of knowledge is appropriate Cranial nerves: There is good facial symmetry. The pupils are equal round and reactive to light bilaterally. Fundoscopic exam reveals clear disc margins bilaterally. Extraocular muscles are intact and visual fields are full to confrontational testing. Speech is fluent and clear. Soft palate rises symmetrically and there is no tongue deviation. Hearing is intact to conversational tone. Tone: Tone is good throughout. Sensation: Sensation is intact to light touch and pinprick throughout (facial, trunk, extremities). Vibration is decreased at the bilateral big toe. There is no extinction with double simultaneous stimulation. There is no sensory dermatomal level identified. Coordination:  The  patient has no dysdiadichokinesia or dysmetria. Motor: Strength is 5/5 in the bilateral upper and lower extremities.  Shoulder shrug is equal bilaterally.  There is no pronator drift.  There are no fasciculations noted. DTR's: Deep tendon reflexes are 2/4 at the bilateral biceps, triceps, brachioradialis, patella and achilles.  Plantar responses are downgoing bilaterally. Gait and Station: The patient is able to ambulate without difficulty. The patient is able to heel  toe walk without any difficulty. The patient is able to ambulate in a tandem fashion. The patient is able to stand in the Romberg position.   MOVEMENT EXAM: Tremor:  There is  tremor in the UE, noted most significantly with action.  The patient is able to draw Archimedes spirals but tremor is evident bilaterally  There is no tremor at rest.  When asked to pour water from one glass to another, there is certainly tremor evident and he spills a little bit of the water, but actually does better than expected  Labs: Patient had lab work at primary care on May 21, 2018.  Sodium was 135, potassium 4.9, chloride 106, CO2 24, BUN 14, creatinine 1.21, glucose 117.     Assessment/Plan:   1.  Tremor  -This is likely due to the lithium.  Studies are varied, but incidate that up to 2/3 of patients on lithium will have lithium-induced tremor, even if the patients are not toxic on the medication.  This is just a common side effect of patients on lithium.  I did not recommend that he stop or taper the lithium, but rather talk to his prescribing physician about this.  I did tell him that in my experience, lithium-induced tremor does not respond well to attempts at treating it with other medications.  He was placed on Zyprexa in the summer, which certainly can induce tremor as well, but that is generally a rest tremor and I really did not see much of this today.  -I will check his lithium level and a TSH today.  I asked him to follow-up with his  psychiatric nurse practitioner.  He has an upcoming appointment.  He also has an upcoming appointment with Dr. [name].  -I reassured the patient that I saw no evidence of Parkinson's disease, which he was worried about.   CC:  Lajean Manes, MD

## 2018-08-13 ENCOUNTER — Other Ambulatory Visit (INDEPENDENT_AMBULATORY_CARE_PROVIDER_SITE_OTHER): Payer: BLUE CROSS/BLUE SHIELD

## 2018-08-13 ENCOUNTER — Encounter: Payer: Self-pay | Admitting: Neurology

## 2018-08-13 ENCOUNTER — Ambulatory Visit: Payer: BLUE CROSS/BLUE SHIELD | Admitting: Neurology

## 2018-08-13 VITALS — BP 116/76 | HR 70 | Ht 72.0 in | Wt 235.0 lb

## 2018-08-13 DIAGNOSIS — R251 Tremor, unspecified: Secondary | ICD-10-CM

## 2018-08-13 DIAGNOSIS — Z5181 Encounter for therapeutic drug level monitoring: Secondary | ICD-10-CM

## 2018-08-13 DIAGNOSIS — G251 Drug-induced tremor: Secondary | ICD-10-CM

## 2018-08-13 NOTE — Patient Instructions (Signed)
1. Your provider has requested that you have labwork completed today. Please go to [facility] ([id number]) on the second floor of this building before leaving the office today. You do not need to check in. If you are not called within 15 minutes please check with the front desk.   

## 2018-08-14 ENCOUNTER — Telehealth: Payer: Self-pay | Admitting: Neurology

## 2018-08-14 LAB — TSH: TSH: 2.51 m[IU]/L (ref 0.40–4.50)

## 2018-08-14 LAB — LITHIUM LEVEL: Lithium Lvl: 1.4 mmol/L — ABNORMAL HIGH (ref 0.6–1.2)

## 2018-08-14 NOTE — Telephone Encounter (Addendum)
Spoke with patient. He was made aware of information and gave permission to send labs to Debbora Dus, NP. Labs faxed to [phone] with confirmation received. He will contact them directly to discuss adjustment of medication.

## 2018-08-14 NOTE — Telephone Encounter (Signed)
Let pt know that his lithium level IS high and likely is the reason for increased tremor.  Please fax copy to his psychiatric NP with patients permission.  He may need an EKG but I am not managing his lithium and would recommend he call NP today for guidance.

## 2018-09-04 DIAGNOSIS — F3174 Bipolar disorder, in full remission, most recent episode manic: Secondary | ICD-10-CM | POA: Diagnosis not present

## 2018-09-04 DIAGNOSIS — F3131 Bipolar disorder, current episode depressed, mild: Secondary | ICD-10-CM | POA: Diagnosis not present

## 2018-09-25 ENCOUNTER — Ambulatory Visit (INDEPENDENT_AMBULATORY_CARE_PROVIDER_SITE_OTHER): Payer: BLUE CROSS/BLUE SHIELD | Admitting: Family Medicine

## 2018-09-25 ENCOUNTER — Other Ambulatory Visit: Payer: Self-pay

## 2018-09-25 VITALS — BP 145/90 | HR 71 | Temp 98.7°F | Resp 17 | Ht 72.0 in | Wt 239.0 lb

## 2018-09-25 DIAGNOSIS — H60542 Acute eczematoid otitis externa, left ear: Secondary | ICD-10-CM | POA: Diagnosis not present

## 2018-09-25 DIAGNOSIS — I10 Essential (primary) hypertension: Secondary | ICD-10-CM

## 2018-09-25 MED ORDER — HYDROCORTISONE 2.5 % EX CREA: TOPICAL_CREAM | Freq: Two times a day (BID) | CUTANEOUS | 0 refills | Status: DC

## 2018-09-25 NOTE — Patient Instructions (Addendum)
If you have lab work done today you will be contacted with your lab results within the next 2 weeks.  If you have not heard from [place] then please contact us. The fastest way to get your results is to register for My Chart.   IF you received an x-ray today, you will receive an invoice from [facility]. Please contact [facility] at [phone] with questions or concerns regarding your invoice.   IF you received labwork today, you will receive an invoice from [facility]. Please contact [facility] at [phone] with questions or concerns regarding your invoice.   Our billing staff will not be able to assist you with questions regarding bills from these companies.  You will be contacted with the lab results as soon as they are available. The fastest way to get your results is to activate your My Chart account. Instructions are located on the last page of this paperwork. If you have not heard from [place] regarding the results in 2 weeks, please contact this office.     Atopic Dermatitis Atopic dermatitis is a skin disorder that causes inflammation of the skin. This is the most common type of eczema. Eczema is a group of skin conditions that cause the skin to be itchy, red, and swollen. This condition is generally worse during the cooler winter months and often improves during the warm summer months. Symptoms can vary from person to person. Atopic dermatitis usually starts showing signs in infancy and can last through adulthood. This condition cannot be passed from one person to another (non-contagious), but it is more common in families. Atopic dermatitis may not always be present. When it is present, it is called a flare-up. What are the causes? The exact cause of this condition is not known. Flare-ups of the condition may be triggered by:  Contact with something that you are sensitive or allergic to.  Stress.  Certain foods.  Extremely hot or cold weather.  Harsh chemicals  and soaps.  Dry air.  Chlorine. What increases the risk? This condition is more likely to develop in people who have a personal history or family history of eczema, allergies, asthma, or hay fever. What are the signs or symptoms? Symptoms of this condition include:  Dry, scaly skin.  Red, itchy rash.  Itchiness, which can be severe. This may occur before the skin rash. This can make sleeping difficult.  Skin thickening and cracking that can occur over time. How is this diagnosed? This condition is diagnosed based on your symptoms, a medical history, and a physical exam. How is this treated? There is no cure for this condition, but symptoms can usually be controlled. Treatment focuses on:  Controlling the itchiness and scratching. You may be given medicines, such as antihistamines or steroid creams.  Limiting exposure to things that you are sensitive or allergic to (allergens).  Recognizing situations that cause stress and developing a plan to manage stress. If your atopic dermatitis does not get better with medicines, or if it is all over your body (widespread), a treatment using a specific type of light (phototherapy) may be used. Follow these instructions at home: Skin care   Keep your skin well-moisturized. Doing this seals in moisture and helps to prevent dryness. ? Use unscented lotions that have petroleum in them. ? Avoid lotions that contain alcohol or water. They can dry the skin.  Keep baths or showers short (less than 5 minutes) in warm water. Do not use hot water. ? Use  mild, unscented cleansers for bathing. Avoid soap and bubble bath. ? Apply a moisturizer to your skin right after a bath or shower.  Do not apply anything to your skin without checking with your health care provider. General instructions  Dress in clothes made of cotton or cotton blends. Dress lightly because heat increases itchiness.  When washing your clothes, rinse your clothes twice so all  of the soap is removed.  Avoid any triggers that can cause a flare-up.  Try to manage your stress.  Keep your fingernails cut short.  Avoid scratching. Scratching makes the rash and itchiness worse. It may also result in a skin infection (impetigo) due to a break in the skin caused by scratching.  Take or apply over-the-counter and prescription medicines only as told by your health care provider.  Keep all follow-up visits as told by your health care provider. This is important.  Do not be around people who have cold sores or fever blisters. If you get the infection, it may cause your atopic dermatitis to worsen. Contact a health care provider if:  Your itchiness interferes with sleep.  Your rash gets worse or it is not better within one week of starting treatment.  You have a fever.  You have a rash flare-up after having contact with someone who has cold sores or fever blisters. Get help right away if:  You develop pus or soft yellow scabs in the rash area. Summary  This condition causes a red rash and itchy, dry, scaly skin.  Treatment focuses on controlling the itchiness and scratching, limiting exposure to things that you are sensitive or allergic to (allergens), recognizing situations that cause stress, and developing a plan to manage stress.  Keep your skin well-moisturized.  Keep baths or showers shorter than 5 minutes and use warm water. Do not use hot water. This information is not intended to replace advice given to you by your health care provider. Make sure you discuss any questions you have with your health care provider. Document Released: 08/19/2000 Document Revised: 09/23/2016 Document Reviewed: 09/23/2016 Elsevier Interactive Patient Education  2019 [employer].

## 2018-09-26 LAB — CMP14+EGFR
ALT: 19 IU/L (ref 0–44)
AST: 15 IU/L (ref 0–40)
Albumin/Globulin Ratio: 2.3 — ABNORMAL HIGH (ref 1.2–2.2)
Albumin: 5 g/dL — ABNORMAL HIGH (ref 3.8–4.8)
Alkaline Phosphatase: 90 IU/L (ref 39–117)
BUN/Creatinine Ratio: 13 (ref 10–24)
BUN: 15 mg/dL (ref 8–27)
Bilirubin Total: 0.3 mg/dL (ref 0.0–1.2)
CO2: 22 mmol/L (ref 20–29)
Calcium: 10.2 mg/dL (ref 8.6–10.2)
Chloride: 103 mmol/L (ref 96–106)
Creatinine, Ser: 1.17 mg/dL (ref 0.76–1.27)
GFR calc Af Amer: 76 mL/min/{1.73_m2} (ref >59)
GFR calc non Af Amer: 65 mL/min/{1.73_m2} (ref >59)
Globulin, Total: 2.2 g/dL (ref 1.5–4.5)
Glucose: 137 mg/dL — ABNORMAL HIGH (ref 65–99)
Potassium: 5.3 mmol/L — ABNORMAL HIGH (ref 3.5–5.2)
Sodium: 140 mmol/L (ref 134–144)
Total Protein: 7.2 g/dL (ref 6.0–8.5)

## 2018-09-26 LAB — LIPID PANEL
CHOLESTEROL TOTAL: 214 mg/dL — AB (ref 100–199)
Chol/HDL Ratio: 3 ratio (ref 0.0–5.0)
HDL: 71 mg/dL (ref >39)
LDL CALC: 119 mg/dL — AB (ref 0–99)
TRIGLYCERIDES: 121 mg/dL (ref 0–149)
VLDL Cholesterol Cal: 24 mg/dL (ref 5–40)

## 2018-10-14 ENCOUNTER — Encounter: Payer: Self-pay | Admitting: Family Medicine

## 2018-10-14 DIAGNOSIS — H60542 Acute eczematoid otitis externa, left ear: Secondary | ICD-10-CM

## 2018-10-14 HISTORY — DX: Acute eczematoid otitis externa, left ear: H60.542

## 2018-10-14 NOTE — Progress Notes (Signed)
Established Patient Office Visit  Subjective:  Patient ID: Devin Ellison, male    DOB: August 17, 1956  Age: 63 y.o. MRN: [id number]  CC:  Chief Complaint  Patient presents with  . outer ear rash left ear x 1 year    shaves hair off the ear with electric razor not with disposable razor.  Given mipurocin cream and it helps and it goes away but after not using it returns    HPI Devin Ellison presents for   Left ear itching off and on for ayear He uses an electric razor to trim his ear hairs Denies exudate or erythema  Hypertension: Patient here for follow-up of elevated blood pressure. He is exercising and is adherent to low salt diet.  Blood pressure is well controlled at home. Cardiac symptoms none. Patient denies chest pressure/discomfort, claudication, dyspnea and exertional chest pressure/discomfort.  Cardiovascular risk factors: hypertension and male gender. Use of agents associated with hypertension: none. History of target organ damage: none . BP Readings from Last 3 Encounters:  09/25/18 (!) 145/90  08/13/18 116/76  01/09/18 115/79     Past Medical History:  Diagnosis Date  . Bipolar 1 disorder ([place])    in remission  . Depression   . Hypertension     Past Surgical History:  Procedure Laterality Date  . ANKLE SURGERY Right   . COLON SURGERY     polypectomy  . HAND SURGERY Right    after fx    Family History  Problem Relation Age of Onset  . Heart disease Mother   . Cerebral palsy Sister 28  . Leukemia Sister   . Breast cancer Sister     Social History   Socioeconomic History  . Marital status: Married    Spouse name: Not on file  . Number of children: Not on file  . Years of education: Not on file  . Highest education level: Not on file  Occupational History  . Not on file  Social Needs  . Financial resource strain: Not on file  . Food insecurity:    Worry: Not on file    Inability: Not on file  . Transportation needs:    Medical: Not on  file    Non-medical: Not on file  Tobacco Use  . Smoking status: Former Smoker    Packs/day: 1.00    Types: Cigarettes    Last attempt to quit: 08/13/2013    Years since quitting: 5.1  . Smokeless tobacco: Never Used  Substance and Sexual Activity  . Alcohol use: Yes    Alcohol/week: 0.0 standard drinks    Comment: 6 beers/week  . Drug use: No  . Sexual activity: Not on file  Lifestyle  . Physical activity:    Days per week: Not on file    Minutes per session: Not on file  . Stress: Not on file  Relationships  . Social connections:    Talks on phone: Not on file    Gets together: Not on file    Attends religious service: Not on file    Active member of club or organization: Not on file    Attends meetings of clubs or organizations: Not on file    Relationship status: Not on file  . Intimate partner violence:    Fear of current or ex partner: Not on file    Emotionally abused: Not on file    Physically abused: Not on file    Forced sexual activity: Not on file  Other Topics Concern  . Not on file  Social History Narrative  . Not on file    Outpatient Medications Prior to Visit  Medication Sig Dispense Refill  . amLODipine (NORVASC) 10 MG tablet Take 1 tablet (10 mg total) by mouth daily. 90 tablet 3  . busPIRone (BUSPAR) 10 MG tablet Take 10 mg by mouth 2 (two) times daily.  5  . lamoTRIgine (LAMICTAL) 200 MG tablet Take 1 tablet (200 mg total) by mouth daily. For mood disorder and depression, 30 tablet 0  . lithium 600 MG capsule Take 900 mg by mouth 2 (two) times daily.     Marland Kitchen losartan (COZAAR) 100 MG tablet Take 1 tablet (100 mg total) by mouth daily. 90 tablet 3  . lurasidone (LATUDA) 80 MG TABS tablet Take 80 mg by mouth daily with breakfast.    . OLANZapine (ZYPREXA) 5 MG tablet Take 5 mg by mouth at bedtime.  0  . VIAGRA 100 MG tablet TAKE 1/2 TO 1 TABLET BY MOUTH ONCE DAILY AS NEEDED FOR ERECTILE DYSFUNCTION 5 tablet 7  . metFORMIN (GLUCOPHAGE) 500 MG tablet Take  500 mg by mouth 2 (two) times daily.  0   No facility-administered medications prior to visit.     No Known Allergies  ROS Review of Systems Review of Systems  Constitutional: Negative for activity change, appetite change, chills and fever.  HENT: Negative for congestion, nosebleeds, trouble swallowing and voice change.   Respiratory: Negative for cough, shortness of breath and wheezing.   Gastrointestinal: Negative for diarrhea, nausea and vomiting.  Genitourinary: Negative for difficulty urinating, dysuria, flank pain and hematuria.  Musculoskeletal: Negative for back pain, joint swelling and neck pain.  Neurological: Negative for dizziness, speech difficulty, light-headedness and numbness.  See HPI. All other review of systems negative.     Objective:    Physical Exam  BP (!) 145/90 (BP Location: Right Arm, Patient Position: Sitting, Cuff Size: Large)   Pulse 71   Temp 98.7 F (37.1 C) (Oral)   Resp 17   Ht 6' (1.829 m)   Wt 239 lb (108.4 kg)   SpO2 97%   BMI 32.41 kg/m  Wt Readings from Last 3 Encounters:  09/25/18 239 lb (108.4 kg)  08/13/18 235 lb (106.6 kg)  01/09/18 224 lb (101.6 kg)   Physical Exam  Constitutional: Oriented to person, place, and time. Appears well-developed and well-nourished.  HENT:  Head: Normocephalic and atraumatic.  Eyes: Conjunctivae and EOM are normal.  Cardiovascular: Normal rate, regular rhythm, normal heart sounds and intact distal pulses.  No murmur heard. Pulmonary/Chest: Effort normal and breath sounds normal. No stridor. No respiratory distress. Has no wheezes.  Neurological: Is alert and oriented to person, place, and time.  Skin: Skin is warm. Capillary refill takes less than 2 seconds. Left ear has rough gritty texture, no fluctuance, no changing moles Psychiatric: Has a normal mood and affect. Behavior is normal. Judgment and thought content normal.    There are no preventive care reminders to display for this  patient.  There are no preventive care reminders to display for this patient.  Lab Results  Component Value Date   TSH 2.51 08/13/2018   Lab Results  Component Value Date   WBC 8.0 01/10/2018   HGB 14.6 01/10/2018   HCT 43 01/10/2018   MCV 92.3 04/30/2013   PLT 313 04/30/2013   Lab Results  Component Value Date   NA 140 09/25/2018   K 5.3 (H) 09/25/2018  CO2 22 09/25/2018   GLUCOSE 137 (H) 09/25/2018   BUN 15 09/25/2018   CREATININE 1.17 09/25/2018   BILITOT 0.3 09/25/2018   ALKPHOS 90 09/25/2018   AST 15 09/25/2018   ALT 19 09/25/2018   PROT 7.2 09/25/2018   ALBUMIN 5.0 (H) 09/25/2018   CALCIUM 10.2 09/25/2018   Lab Results  Component Value Date   CHOL 214 (H) 09/25/2018   Lab Results  Component Value Date   HDL 71 09/25/2018   Lab Results  Component Value Date   LDLCALC 119 (H) 09/25/2018   Lab Results  Component Value Date   TRIG 121 09/25/2018   Lab Results  Component Value Date   CHOLHDL 3.0 09/25/2018   No results found for: HGBA1C    Assessment & Plan:   Problem List Items Addressed This Visit      Cardiovascular and Mediastinum   Essential hypertension - Primary    Patient's blood pressure is at typically goal of 139/89 or less. Condition is stable. Continue current medications and treatment plan. I recommend that you exercise for 30-45 minutes 5 days a week. I also recommend a balanced diet with fruits and vegetables every day, lean meats, and little fried foods. The DASH diet (you can find this online) is a good example of this.       Relevant Orders   Lipid panel (Completed)   CMP14+EGFR (Completed)     Nervous and Auditory   Dermatitis of left ear canal    Refilled topical hydrocortisone Continue skin care routine          Meds ordered this encounter  Medications  . hydrocortisone 2.5 % cream    Sig: Apply topically 2 (two) times daily.    Dispense:  30 g    Refill:  0    Follow-up: No follow-ups on file.    Forrest Moron, MD

## 2018-10-14 NOTE — Assessment & Plan Note (Addendum)
Patient's blood pressure is at typically goal of 139/89 or less. Condition is stable. Continue current medications and treatment plan. I recommend that you exercise for 30-45 minutes 5 days a week. I also recommend a balanced diet with fruits and vegetables every day, lean meats, and little fried foods. The DASH diet (you can find this online) is a good example of this.

## 2018-10-14 NOTE — Assessment & Plan Note (Signed)
Refilled topical hydrocortisone Continue skin care routine

## 2018-10-21 ENCOUNTER — Other Ambulatory Visit: Payer: Self-pay | Admitting: Family Medicine

## 2018-10-21 DIAGNOSIS — H6012 Cellulitis of left external ear: Secondary | ICD-10-CM

## 2018-10-22 NOTE — Telephone Encounter (Signed)
Requested medication (s) are due for refill today -no  Requested medication (s) are on the active medication list -no  Future visit scheduled -no  Last refill: 11/20/17  Notes to clinic: Patient is requesting refill of medication not current of medication list or assigned to protocol.Sent for review by PCP  Requested Prescriptions  Pending Prescriptions Disp Refills   mupirocin ointment (BACTROBAN) 2 % [Pharmacy Med Name: MUPIROCIN 2% OINTMENT] 22 g 0    Sig: APPLY TO AFFECTED AREA 3 TIMES A DAY FOR 1 WEEK     Off-Protocol Failed - 10/21/2018  8:43 AM      Failed - Medication not assigned to a protocol, review manually.      Passed - Valid encounter within last 12 months    Recent Outpatient Visits          3 weeks ago Essential hypertension   Primary Care at [facility], Arlie Solomons, [place]   9 months ago Essential hypertension   Primary Care at [facility], [place], [place]   11 months ago Cellulitis of left ear   Primary Care at [facility], Ranell Patrick, MD   1 year ago Fatigue, unspecified type   Primary Care at [facility], Ranell Patrick, [place]   1 year ago Essential hypertension   Primary Care at [facility], Gelene Mink, [place]              Requested Prescriptions  Pending Prescriptions Disp Refills   mupirocin ointment (BACTROBAN) 2 % [Pharmacy Med Name: MUPIROCIN 2% OINTMENT] 22 g 0    Sig: APPLY TO AFFECTED AREA 3 TIMES A DAY FOR 1 WEEK     Off-Protocol Failed - 10/21/2018  8:43 AM      Failed - Medication not assigned to a protocol, review manually.      Passed - Valid encounter within last 12 months    Recent Outpatient Visits          3 weeks ago Essential hypertension   Primary Care at [facility], Arlie Solomons, MD   9 months ago Essential hypertension   Primary Care at [facility], [place], [place]   11 months ago Cellulitis of left ear   Primary Care at [place], [place]   1 year ago Fatigue, unspecified type   Primary Care  at [facility], Ranell Patrick, MD   1 year ago Essential hypertension   Primary Care at [facility], Gelene Mink, [place]

## 2018-11-19 DIAGNOSIS — F3132 Bipolar disorder, current episode depressed, moderate: Secondary | ICD-10-CM | POA: Diagnosis not present

## 2018-11-20 DIAGNOSIS — Z0000 Encounter for general adult medical examination without abnormal findings: Secondary | ICD-10-CM | POA: Diagnosis not present

## 2018-11-20 DIAGNOSIS — Z79899 Other long term (current) drug therapy: Secondary | ICD-10-CM | POA: Diagnosis not present

## 2018-11-20 DIAGNOSIS — R7303 Prediabetes: Secondary | ICD-10-CM | POA: Diagnosis not present

## 2018-11-20 DIAGNOSIS — I10 Essential (primary) hypertension: Secondary | ICD-10-CM | POA: Diagnosis not present

## 2019-01-21 DIAGNOSIS — F3174 Bipolar disorder, in full remission, most recent episode manic: Secondary | ICD-10-CM | POA: Diagnosis not present

## 2019-01-21 DIAGNOSIS — F3131 Bipolar disorder, current episode depressed, mild: Secondary | ICD-10-CM | POA: Diagnosis not present

## 2019-01-29 DIAGNOSIS — Z130 Encounter for screening for diseases of the blood and blood-forming organs and certain disorders involving the immune mechanism: Secondary | ICD-10-CM | POA: Diagnosis not present

## 2019-01-29 DIAGNOSIS — Z5181 Encounter for therapeutic drug level monitoring: Secondary | ICD-10-CM | POA: Diagnosis not present

## 2019-01-29 DIAGNOSIS — E785 Hyperlipidemia, unspecified: Secondary | ICD-10-CM | POA: Diagnosis not present

## 2019-01-29 DIAGNOSIS — Z79899 Other long term (current) drug therapy: Secondary | ICD-10-CM | POA: Diagnosis not present

## 2019-02-01 ENCOUNTER — Other Ambulatory Visit: Payer: Self-pay | Admitting: Physician Assistant

## 2019-02-01 DIAGNOSIS — I10 Essential (primary) hypertension: Secondary | ICD-10-CM

## 2019-02-01 NOTE — Telephone Encounter (Signed)
Requested medication (s) are due for refill today: yes  Requested medication (s) are on the active medication list: yes  Last refill: 01/09/18 E McVey  Future visit scheduled:no  Notes to clinic: Expired RX. Last potassium high    Requested Prescriptions  Pending Prescriptions Disp Refills   losartan (COZAAR) 100 MG tablet [Pharmacy Med Name: LOSARTAN POTASSIUM 100 MG TAB] 90 tablet 3    Sig: TAKE 1 TABLET BY MOUTH EVERY DAY     Cardiovascular:  Angiotensin Receptor Blockers Failed - 02/01/2019 11:11 AM      Failed - K in normal range and within 180 days    Potassium  Date Value Ref Range Status  09/25/2018 5.3 (H) 3.5 - 5.2 mmol/L Final         Failed - Last BP in normal range    BP Readings from Last 1 Encounters:  09/25/18 (!) 145/90         Failed - Valid encounter within last 6 months    Recent Outpatient Visits          4 months ago Essential hypertension   Primary Care at [facility], Arlie Solomons, MD   1 year ago Essential hypertension   Primary Care at [facility], Gelene Mink, PA-C   1 year ago Cellulitis of left ear   Primary Care at [facility], Ranell Patrick, MD   1 year ago Fatigue, unspecified type   Primary Care at [facility], Ranell Patrick, MD   1 year ago Essential hypertension   Primary Care at [facility], [place], PA-C             Passed - Cr in normal range and within 180 days    Creat  Date Value Ref Range Status  11/09/2014 0.92 0.50 - 1.35 mg/dL Final   Creatinine, Ser  Date Value Ref Range Status  09/25/2018 1.17 0.76 - 1.27 mg/dL Final         Passed - Patient is not pregnant

## 2019-03-02 ENCOUNTER — Other Ambulatory Visit: Payer: Self-pay | Admitting: Physician Assistant

## 2019-03-02 DIAGNOSIS — I10 Essential (primary) hypertension: Secondary | ICD-10-CM

## 2019-03-02 NOTE — Telephone Encounter (Signed)
Forwarding medication refill to PCP for review. 

## 2019-04-02 ENCOUNTER — Other Ambulatory Visit: Payer: Self-pay

## 2019-04-02 ENCOUNTER — Ambulatory Visit (INDEPENDENT_AMBULATORY_CARE_PROVIDER_SITE_OTHER): Payer: BC Managed Care – PPO | Admitting: Family Medicine

## 2019-04-02 ENCOUNTER — Encounter: Payer: Self-pay | Admitting: Family Medicine

## 2019-04-02 VITALS — BP 120/66 | HR 66 | Temp 98.6°F | Ht 72.0 in | Wt 237.8 lb

## 2019-04-02 DIAGNOSIS — Z1283 Encounter for screening for malignant neoplasm of skin: Secondary | ICD-10-CM

## 2019-04-02 DIAGNOSIS — I10 Essential (primary) hypertension: Secondary | ICD-10-CM | POA: Diagnosis not present

## 2019-04-02 DIAGNOSIS — F319 Bipolar disorder, unspecified: Secondary | ICD-10-CM

## 2019-04-02 DIAGNOSIS — Z131 Encounter for screening for diabetes mellitus: Secondary | ICD-10-CM

## 2019-04-02 DIAGNOSIS — Z5181 Encounter for therapeutic drug level monitoring: Secondary | ICD-10-CM

## 2019-04-02 DIAGNOSIS — Z125 Encounter for screening for malignant neoplasm of prostate: Secondary | ICD-10-CM | POA: Diagnosis not present

## 2019-04-02 DIAGNOSIS — L309 Dermatitis, unspecified: Secondary | ICD-10-CM

## 2019-04-02 MED ORDER — AMLODIPINE BESYLATE 10 MG PO TABS: 10.0000 mg | ORAL_TABLET | Freq: Every day | ORAL | 3 refills | Status: DC

## 2019-04-02 NOTE — Patient Instructions (Addendum)
Instructions for ear irritation Use head and shoulders classic clean shampoo to wash the hair and the external ear  Apply lotrimin daily  Apply sunscreen to the ear daily   If you have lab work done today you will be contacted with your lab results within the next 2 weeks.  If you have not heard from [place] then please contact us. The fastest way to get your results is to register for My Chart.   IF you received an x-ray today, you will receive an invoice from [facility]. Please contact [facility] at [phone] with questions or concerns regarding your invoice.   IF you received labwork today, you will receive an invoice from [place]. Please contact [facility] at [phone] with questions or concerns regarding your invoice.   Our billing staff will not be able to assist you with questions regarding bills from these companies.  You will be contacted with the lab results as soon as they are available. The fastest way to get your results is to activate your My Chart account. Instructions are located on the last page of this paperwork. If you have not heard from [place] regarding the results in 2 weeks, please contact this office.      Managing Your Hypertension Hypertension is commonly called high blood pressure. This is when the force of your blood pressing against the walls of your arteries is too strong. Arteries are blood vessels that carry blood from your heart throughout your body. Hypertension forces the heart to work harder to pump blood, and may cause the arteries to become narrow or stiff. Having untreated or uncontrolled hypertension can cause heart attack, stroke, kidney disease, and other problems. What are blood pressure readings? A blood pressure reading consists of a higher number over a lower number. Ideally, your blood pressure should be below 120/80. The first ("top") number is called the systolic pressure. It is a measure of the pressure in your arteries as your  heart beats. The second ("bottom") number is called the diastolic pressure. It is a measure of the pressure in your arteries as the heart relaxes. What does my blood pressure reading mean? Blood pressure is classified into four stages. Based on your blood pressure reading, your health care provider may use the following stages to determine what type of treatment you need, if any. Systolic pressure and diastolic pressure are measured in a unit called mm Hg. Normal  Systolic pressure: below 010.  Diastolic pressure: below 80. Elevated  Systolic pressure: 272-536.  Diastolic pressure: below 80. Hypertension stage 1  Systolic pressure: 644-034.  Diastolic pressure: 74-25. Hypertension stage 2  Systolic pressure: 956 or above.  Diastolic pressure: 90 or above. What health risks are associated with hypertension? Managing your hypertension is an important responsibility. Uncontrolled hypertension can lead to:  A heart attack.  A stroke.  A weakened blood vessel (aneurysm).  Heart failure.  Kidney damage.  Eye damage.  Metabolic syndrome.  Memory and concentration problems. What changes can I make to manage my hypertension? Hypertension can be managed by making lifestyle changes and possibly by taking medicines. Your health care provider will help you make a plan to bring your blood pressure within a normal range. Eating and drinking   Eat a diet that is high in fiber and potassium, and low in salt (sodium), added sugar, and fat. An example eating plan is called the DASH (Dietary Approaches to Stop Hypertension) diet. To eat this way: ? Eat plenty of fresh fruits and vegetables.  Try to fill half of your plate at each meal with fruits and vegetables. ? Eat whole grains, such as whole wheat pasta, brown rice, or whole grain bread. Fill about one quarter of your plate with whole grains. ? Eat low-fat diary products. ? Avoid fatty cuts of meat, processed or cured meats, and  poultry with skin. Fill about one quarter of your plate with lean proteins such as fish, chicken without skin, beans, eggs, and tofu. ? Avoid premade and processed foods. These tend to be higher in sodium, added sugar, and fat.  Reduce your daily sodium intake. Most people with hypertension should eat less than 1,500 mg of sodium a day.  Limit alcohol intake to no more than 1 drink a day for nonpregnant women and 2 drinks a day for men. One drink equals 12 oz of beer, 5 oz of wine, or 1 oz of hard liquor. Lifestyle  Work with your health care provider to maintain a healthy body weight, or to lose weight. Ask what an ideal weight is for you.  Get at least 30 minutes of exercise that causes your heart to beat faster (aerobic exercise) most days of the week. Activities may include walking, swimming, or biking.  Include exercise to strengthen your muscles (resistance exercise), such as weight lifting, as part of your weekly exercise routine. Try to do these types of exercises for 30 minutes at least 3 days a week.  Do not use any products that contain nicotine or tobacco, such as cigarettes and e-cigarettes. If you need help quitting, ask your health care provider.  Control any long-term (chronic) conditions you have, such as high cholesterol or diabetes. Monitoring  Monitor your blood pressure at home as told by your health care provider. Your personal target blood pressure may vary depending on your medical conditions, your age, and other factors.  Have your blood pressure checked regularly, as often as told by your health care provider. Working with your health care provider  Review all the medicines you take with your health care provider because there may be side effects or interactions.  Talk with your health care provider about your diet, exercise habits, and other lifestyle factors that may be contributing to hypertension.  Visit your health care provider regularly. Your health care  provider can help you create and adjust your plan for managing hypertension. Will I need medicine to control my blood pressure? Your health care provider may prescribe medicine if lifestyle changes are not enough to get your blood pressure under control, and if:  Your systolic blood pressure is 130 or higher.  Your diastolic blood pressure is 80 or higher. Take medicines only as told by your health care provider. Follow the directions carefully. Blood pressure medicines must be taken as prescribed. The medicine does not work as well when you skip doses. Skipping doses also puts you at risk for problems. Contact a health care provider if:  You think you are having a reaction to medicines you have taken.  You have repeated (recurrent) headaches.  You feel dizzy.  You have swelling in your ankles.  You have trouble with your vision. Get help right away if:  You develop a severe headache or confusion.  You have unusual weakness or numbness, or you feel faint.  You have severe pain in your chest or abdomen.  You vomit repeatedly.  You have trouble breathing. Summary  Hypertension is when the force of blood pumping through your arteries is too strong. If this  condition is not controlled, it may put you at risk for serious complications.  Your personal target blood pressure may vary depending on your medical conditions, your age, and other factors. For most people, a normal blood pressure is less than 120/80.  Hypertension is managed by lifestyle changes, medicines, or both. Lifestyle changes include weight loss, eating a healthy, low-sodium diet, exercising more, and limiting alcohol. This information is not intended to replace advice given to you by your health care provider. Make sure you discuss any questions you have with your health care provider. Document Released: 05/16/2012 Document Revised: 12/14/2018 Document Reviewed: 07/20/2016 Elsevier Patient Education  2020 [employer].

## 2019-04-02 NOTE — Progress Notes (Signed)
Established Patient Office Visit  Subjective:  Patient ID: Devin Ellison, male    DOB: 1955/11/28  Age: 63 y.o. MRN: [id number]  CC:  Chief Complaint  Patient presents with  . Hypertension    F/u     HPI GRIER VU presents for   Hypertension: Patient here for follow-up of elevated blood pressure. He is exercising and is adherent to low salt diet.  Blood pressure is well controlled at home. Cardiac symptoms none. Patient denies chest pressure/discomfort, dyspnea, fatigue, irregular heart beat, lower extremity edema, near-syncope and orthopnea.  Cardiovascular risk factors: advanced age (older than 65 for men, 29 for women) and hypertension. Use of agents associated with hypertension: none. History of target organ damage: none. BP Readings from Last 3 Encounters:  04/02/19 120/66  09/25/18 (!) 145/90  08/13/18 116/76     Bipolar type 1  Patient reports that Dr. [name] changed his meds around and he is managing pretty well He goes on medicare in October and she does not do medicare He states that he is waking up at 3am and cannot go back to sleep He states that he eventually goes back to sleep but gets anxious about his sleep Depression screen [facility] 2/9 04/02/2019 09/25/2018 01/09/2018 11/20/2017 07/18/2017  Decreased Interest 0 1 0 0 0  Down, Depressed, Hopeless 1 1 0 0 0  PHQ - 2 Score 1 2 0 0 0  Altered sleeping - 1 - - -  Tired, decreased energy - 1 - - -  Change in appetite - 3 - - -  Feeling bad or failure about yourself  - 2 - - -  Trouble concentrating - 3 - - -  Moving slowly or fidgety/restless - 0 - - -  Suicidal thoughts - 0 - - -  PHQ-9 Score - 12 - - -  Difficult doing work/chores - Somewhat difficult - - -    Screening for cancer Pt reports that he does not have a family history of prostate cancer He wakes up once at night to urinate No urgency, frequency or hesitation   Past Medical History:  Diagnosis Date  . Bipolar 1 disorder ([place])    in  remission  . Depression   . Hypertension     Past Surgical History:  Procedure Laterality Date  . ANKLE SURGERY Right   . COLON SURGERY     polypectomy  . HAND SURGERY Right    after fx    Family History  Problem Relation Age of Onset  . Heart disease Mother   . Cerebral palsy Sister 42  . Leukemia Sister   . Breast cancer Sister     Social History   Socioeconomic History  . Marital status: Married    Spouse name: Not on file  . Number of children: Not on file  . Years of education: Not on file  . Highest education level: Not on file  Occupational History  . Not on file  Social Needs  . Financial resource strain: Not on file  . Food insecurity    Worry: Not on file    Inability: Not on file  . Transportation needs    Medical: Not on file    Non-medical: Not on file  Tobacco Use  . Smoking status: Former Smoker    Packs/day: 1.00    Types: Cigarettes    Quit date: 08/13/2013    Years since quitting: 5.6  . Smokeless tobacco: Never Used  Substance and Sexual Activity  .  Alcohol use: Yes    Alcohol/week: 0.0 standard drinks    Comment: 6 beers/week  . Drug use: No  . Sexual activity: Not on file  Lifestyle  . Physical activity    Days per week: Not on file    Minutes per session: Not on file  . Stress: Not on file  Relationships  . Social Herbalist on phone: Not on file    Gets together: Not on file    Attends religious service: Not on file    Active member of club or organization: Not on file    Attends meetings of clubs or organizations: Not on file    Relationship status: Not on file  . Intimate partner violence    Fear of current or ex partner: Not on file    Emotionally abused: Not on file    Physically abused: Not on file    Forced sexual activity: Not on file  Other Topics Concern  . Not on file  Social History Narrative  . Not on file    Outpatient Medications Prior to Visit  Medication Sig Dispense Refill  . diazepam  (VALIUM) 5 MG tablet TAKE 1 TO 2 TABLETS TWICE DAILY AS NEEDED    . lamoTRIgine (LAMICTAL) 200 MG tablet Take 1 tablet (200 mg total) by mouth daily. For mood disorder and depression, (Patient taking differently: Take 200 mg by mouth 2 (two) times daily. For mood disorder and depression,) 30 tablet 0  . losartan (COZAAR) 100 MG tablet TAKE 1 TABLET BY MOUTH EVERY DAY 90 tablet 0  . VIAGRA 100 MG tablet TAKE 1/2 TO 1 TABLET BY MOUTH ONCE DAILY AS NEEDED FOR ERECTILE DYSFUNCTION 5 tablet 7  . VRAYLAR capsule Take 1.5 mg by mouth daily.    Marland Kitchen amLODipine (NORVASC) 10 MG tablet Take 1 tablet (10 mg total) by mouth daily. 90 tablet 3  . lithium 600 MG capsule Take 900 mg by mouth 2 (two) times daily.     . mupirocin ointment (BACTROBAN) 2 % APPLY TO AFFECTED AREA 3 TIMES A DAY FOR 1 WEEK 22 g 0  . busPIRone (BUSPAR) 10 MG tablet Take 10 mg by mouth 2 (two) times daily.  5  . hydrocortisone 2.5 % cream Apply topically 2 (two) times daily. 30 g 0  . lurasidone (LATUDA) 80 MG TABS tablet Take 80 mg by mouth daily with breakfast.    . metFORMIN (GLUCOPHAGE) 500 MG tablet Take 500 mg by mouth 2 (two) times daily.  0  . OLANZapine (ZYPREXA) 5 MG tablet Take 5 mg by mouth at bedtime.  0   No facility-administered medications prior to visit.     No Known Allergies  ROS Review of Systems Review of Systems  Constitutional: Negative for activity change, appetite change, chills and fever.  HENT: Negative for congestion, nosebleeds, trouble swallowing and voice change.   Respiratory: Negative for cough, shortness of breath and wheezing.   Gastrointestinal: Negative for diarrhea, nausea and vomiting.  Genitourinary: Negative for difficulty urinating, dysuria, flank pain and hematuria.  Musculoskeletal: Negative for back pain, joint swelling and neck pain.  Neurological: Negative for dizziness, speech difficulty, light-headedness and numbness.  See HPI. All other review of systems negative.     Objective:     Physical Exam  BP 120/66 (BP Location: Right Arm, Patient Position: Sitting, Cuff Size: Normal)   Pulse 66   Temp 98.6 F (37 C) (Oral)   Ht 6' (1.829 m)  Wt 237 lb 12.8 oz (107.9 kg)   SpO2 97%   BMI 32.25 kg/m  Wt Readings from Last 3 Encounters:  04/02/19 237 lb 12.8 oz (107.9 kg)  09/25/18 239 lb (108.4 kg)  08/13/18 235 lb (106.6 kg)   Physical Exam  Constitutional: Oriented to person, place, and time. Appears well-developed and well-nourished.  HENT:  Head: Normocephalic and atraumatic.  Eyes: Conjunctivae and EOM are normal.  Ears: hyperpigmented, with coarse rough skin on the pinna worse on the left than the right Cardiovascular: Normal rate, regular rhythm, normal heart sounds and intact distal pulses.  No murmur heard. Pulmonary/Chest: Effort normal and breath sounds normal. No stridor. No respiratory distress. Has no wheezes.  Neurological: Is alert and oriented to person, place, and time.  Skin: Skin is warm. Capillary refill takes less than 2 seconds.  Psychiatric: Has a normal mood and affect. Behavior is normal. Judgment and thought content normal.    There are no preventive care reminders to display for this patient.  There are no preventive care reminders to display for this patient.  Lab Results  Component Value Date   TSH 2.51 08/13/2018   Lab Results  Component Value Date   WBC 8.0 01/10/2018   HGB 14.6 01/10/2018   HCT 43 01/10/2018   MCV 92.3 04/30/2013   PLT 313 04/30/2013   Lab Results  Component Value Date   NA 140 09/25/2018   K 5.3 (H) 09/25/2018   CO2 22 09/25/2018   GLUCOSE 137 (H) 09/25/2018   BUN 15 09/25/2018   CREATININE 1.17 09/25/2018   BILITOT 0.3 09/25/2018   ALKPHOS 90 09/25/2018   AST 15 09/25/2018   ALT 19 09/25/2018   PROT 7.2 09/25/2018   ALBUMIN 5.0 (H) 09/25/2018   CALCIUM 10.2 09/25/2018   Lab Results  Component Value Date   CHOL 214 (H) 09/25/2018   Lab Results  Component Value Date   HDL 71  09/25/2018   Lab Results  Component Value Date   LDLCALC 119 (H) 09/25/2018   Lab Results  Component Value Date   TRIG 121 09/25/2018   Lab Results  Component Value Date   CHOLHDL 3.0 09/25/2018   No results found for: HGBA1C    Assessment & Plan:   Problem List Items Addressed This Visit      Cardiovascular and Mediastinum   Essential hypertension - Primary Patient's blood pressure is at goal of 139/89 or less. Condition is stable. Continue current medications and treatment plan. I recommend that you exercise for 30-45 minutes 5 days a week. I also recommend a balanced diet with fruits and vegetables every day, lean meats, and little fried foods. The DASH diet (you can find this online) is a good example of this.    Relevant Medications   amLODipine (NORVASC) 10 MG tablet   Other Relevant Orders   CMP14+EGFR   Lipid panel    Other Visit Diagnoses    Bipolar I disorder ([place])    -  Continue with psychiatry, will check tsh as he was previously on lithium   Relevant Orders   TSH   Screening for diabetes mellitus       Relevant Orders   Hemoglobin A1c   Screening for prostate cancer       Relevant Orders   PSA   Screening for skin cancer    -  Advised sunscreen and skin care handout provided    Relevant Orders   Ambulatory referral to Dermatology  Dermatitis of external ear       Relevant Orders   Ambulatory referral to Dermatology   Encounter for medication monitoring       Relevant Orders   TSH      Meds ordered this encounter  Medications  . amLODipine (NORVASC) 10 MG tablet    Sig: Take 1 tablet (10 mg total) by mouth daily.    Dispense:  90 tablet    Refill:  3    Follow-up: Return in about 6 months (around 10/03/2019) for welcome to medicare visit.    Forrest Moron, MD

## 2019-04-03 LAB — CMP14+EGFR
ALT: 16 IU/L (ref 0–44)
AST: 11 IU/L (ref 0–40)
Albumin/Globulin Ratio: 2.4 — ABNORMAL HIGH (ref 1.2–2.2)
Albumin: 4.8 g/dL (ref 3.8–4.8)
Alkaline Phosphatase: 95 IU/L (ref 39–117)
BUN/Creatinine Ratio: 10 (ref 10–24)
BUN: 12 mg/dL (ref 8–27)
Bilirubin Total: 0.3 mg/dL (ref 0.0–1.2)
CO2: 20 mmol/L (ref 20–29)
Calcium: 9.7 mg/dL (ref 8.6–10.2)
Chloride: 102 mmol/L (ref 96–106)
Creatinine, Ser: 1.19 mg/dL (ref 0.76–1.27)
GFR calc Af Amer: 74 mL/min/{1.73_m2} (ref >59)
GFR calc non Af Amer: 64 mL/min/{1.73_m2} (ref >59)
Globulin, Total: 2 g/dL (ref 1.5–4.5)
Glucose: 146 mg/dL — ABNORMAL HIGH (ref 65–99)
Potassium: 4.8 mmol/L (ref 3.5–5.2)
Sodium: 141 mmol/L (ref 134–144)
Total Protein: 6.8 g/dL (ref 6.0–8.5)

## 2019-04-03 LAB — LIPID PANEL
Chol/HDL Ratio: 4 ratio (ref 0.0–5.0)
Cholesterol, Total: 189 mg/dL (ref 100–199)
HDL: 47 mg/dL (ref >39)
LDL Calculated: 88 mg/dL (ref 0–99)
Triglycerides: 269 mg/dL — ABNORMAL HIGH (ref 0–149)
VLDL Cholesterol Cal: 54 mg/dL — ABNORMAL HIGH (ref 5–40)

## 2019-04-03 LAB — TSH: TSH: 1.36 u[IU]/mL (ref 0.450–4.500)

## 2019-04-03 LAB — HEMOGLOBIN A1C
Est. average glucose Bld gHb Est-mCnc: 143 mg/dL
Hgb A1c MFr Bld: 6.6 % — ABNORMAL HIGH (ref 4.8–5.6)

## 2019-04-03 LAB — PSA: Prostate Specific Ag, Serum: 1.9 ng/mL (ref 0.0–4.0)

## 2019-04-09 DIAGNOSIS — D484 Neoplasm of uncertain behavior of peritoneum: Secondary | ICD-10-CM | POA: Diagnosis not present

## 2019-04-09 DIAGNOSIS — H61002 Unspecified perichondritis of left external ear: Secondary | ICD-10-CM | POA: Diagnosis not present

## 2019-04-10 NOTE — Progress Notes (Signed)
Pt is not responding to message left. Sent letter of results informing pt that he needs to call for ov or vv with [facility] as instructed

## 2019-04-14 DIAGNOSIS — H109 Unspecified conjunctivitis: Secondary | ICD-10-CM | POA: Diagnosis not present

## 2019-04-14 DIAGNOSIS — H5711 Ocular pain, right eye: Secondary | ICD-10-CM | POA: Diagnosis not present

## 2019-04-15 ENCOUNTER — Telehealth (INDEPENDENT_AMBULATORY_CARE_PROVIDER_SITE_OTHER): Payer: BC Managed Care – PPO | Admitting: Family Medicine

## 2019-04-15 ENCOUNTER — Encounter: Payer: Self-pay | Admitting: Family Medicine

## 2019-04-15 VITALS — Temp 97.5°F

## 2019-04-15 DIAGNOSIS — E119 Type 2 diabetes mellitus without complications: Secondary | ICD-10-CM | POA: Diagnosis not present

## 2019-04-15 NOTE — Progress Notes (Signed)
Telemedicine Encounter- SOAP NOTE Established Patient  This telephone encounter was conducted with the patient's (or proxy's) verbal consent via audio telecommunications: yes/no: Yes Patient was instructed to have this encounter in a suitably private space; and to only have persons present to whom they give permission to participate. In addition, patient identity was confirmed by use of name plus two identifiers (DOB and address).  I discussed the limitations, risks, security and privacy concerns of performing an evaluation and management service by telephone and the availability of in person appointments. I also discussed with the patient that there may be a patient responsible charge related to this service. The patient expressed understanding and agreed to proceed.  I spent a total of TIME; 0 MIN TO 60 MIN: 15 minutes talking with the patient or their proxy.  CC: diabetes diagnosis  Subjective   Devin Ellison is a 63 y.o. established patient. Telephone visit today for  HPI  New Diagnosis of Diabetes Patient reports that he does not know what has sugars in it He does not know what he should be eating and would like some more information. He denies polyuria, polydipsia, polyphagia Component                                     Latest Ref Rng & Units 04/02/2019  Hemoglobin A1C                                            4.8 - 5.6 % 6.6 (H)  Est. average glucose Bld gHb Est-mCnc          mg/dL 143    Patient Active Problem List   Diagnosis Date Noted  . Dermatitis of left ear canal 10/14/2018  . Essential hypertension 03/13/2017  . Benign colon polyp 04/11/2016  . MDD (major depressive disorder), single episode 04/30/2013  . Suicidal ideation 04/30/2013    Past Medical History:  Diagnosis Date  . Bipolar 1 disorder (Devin Ellison)    in remission  . Depression   . Hypertension     Current Outpatient Medications  Medication Sig Dispense Refill  . amLODipine (NORVASC) 10 MG tablet  Take 1 tablet (10 mg total) by mouth daily. 90 tablet 3  . diazepam (VALIUM) 5 MG tablet TAKE 1 TO 2 TABLETS TWICE DAILY AS NEEDED    . lamoTRIgine (LAMICTAL) 200 MG tablet Take 1 tablet (200 mg total) by mouth daily. For mood disorder and depression, (Patient taking differently: Take 200 mg by mouth 2 (two) times daily. For mood disorder and depression,) 30 tablet 0  . losartan (COZAAR) 100 MG tablet TAKE 1 TABLET BY MOUTH EVERY DAY 90 tablet 0  . VIAGRA 100 MG tablet TAKE 1/2 TO 1 TABLET BY MOUTH ONCE DAILY AS NEEDED FOR ERECTILE DYSFUNCTION 5 tablet 7  . VRAYLAR capsule Take 1.5 mg by mouth daily.     No current facility-administered medications for this visit.     No Known Allergies  Social History   Socioeconomic History  . Marital status: Married    Spouse name: Not on file  . Number of children: Not on file  . Years of education: Not on file  . Highest education level: Not on file  Occupational History  . Not on file  Social Needs  .  Financial resource strain: Not on file  . Food insecurity    Worry: Not on file    Inability: Not on file  . Transportation needs    Medical: Not on file    Non-medical: Not on file  Tobacco Use  . Smoking status: Former Smoker    Packs/day: 1.00    Types: Cigarettes    Quit date: 08/13/2013    Years since quitting: 5.6  . Smokeless tobacco: Never Used  Substance and Sexual Activity  . Alcohol use: Yes    Alcohol/week: 0.0 standard drinks    Comment: 6 beers/week  . Drug use: No  . Sexual activity: Not on file  Lifestyle  . Physical activity    Days per week: Not on file    Minutes per session: Not on file  . Stress: Not on file  Relationships  . Social Herbalist on phone: Not on file    Gets together: Not on file    Attends religious service: Not on file    Active member of club or organization: Not on file    Attends meetings of clubs or organizations: Not on file    Relationship status: Not on file  . Intimate  partner violence    Fear of current or ex partner: Not on file    Emotionally abused: Not on file    Physically abused: Not on file    Forced sexual activity: Not on file  Other Topics Concern  . Not on file  Social History Narrative  . Not on file    ROS  Objective   Vitals as reported by the patient: Today's Vitals   04/15/19 1348  Temp: (!) 97.5 F (36.4 C)  TempSrc: Oral    Diagnoses and all orders for this visit:  Newly diagnosed diabetes ([name]) -     Ambulatory referral to diabetic education  discussed diabetic education Reviewed starches and simple sugars Discussed [email or web address]   I discussed the assessment and treatment plan with the patient. The patient was provided an opportunity to ask questions and all were answered. The patient agreed with the plan and demonstrated an understanding of the instructions.   The patient was advised to call back or seek an in-person evaluation if the symptoms worsen or if the condition fails to improve as anticipated.  I provided 15 minutes of non-face-to-face time during this encounter.  Forrest Moron, MD  Primary Care at [facility]

## 2019-04-15 NOTE — Patient Instructions (Signed)
° ° ° °  If you have lab work done today you will be contacted with your lab results within the next 2 weeks.  If you have not heard from us then please contact us. The fastest way to get your results is to register for My Chart. ° ° °IF you received an x-ray today, you will receive an invoice from [facility]. Please contact [facility] at [phone] with questions or concerns regarding your invoice.  ° °IF you received labwork today, you will receive an invoice from [facility]. Please contact [facility] at [phone] with questions or concerns regarding your invoice.  ° °Our billing staff will not be able to assist you with questions regarding bills from these companies. ° °You will be contacted with the lab results as soon as they are available. The fastest way to get your results is to activate your My Chart account. Instructions are located on the last page of this paperwork. If you have not heard from us regarding the results in 2 weeks, please contact this office. °  ° ° ° °

## 2019-04-15 NOTE — Progress Notes (Signed)
Talk about lab results and possible Diabetes

## 2019-04-23 DIAGNOSIS — F3176 Bipolar disorder, in full remission, most recent episode depressed: Secondary | ICD-10-CM | POA: Diagnosis not present

## 2019-04-23 DIAGNOSIS — F3174 Bipolar disorder, in full remission, most recent episode manic: Secondary | ICD-10-CM | POA: Diagnosis not present

## 2019-04-29 ENCOUNTER — Other Ambulatory Visit: Payer: Self-pay | Admitting: Family Medicine

## 2019-04-29 DIAGNOSIS — I10 Essential (primary) hypertension: Secondary | ICD-10-CM

## 2019-05-28 ENCOUNTER — Encounter: Payer: BC Managed Care – PPO | Attending: Family Medicine | Admitting: *Deleted

## 2019-05-28 ENCOUNTER — Other Ambulatory Visit: Payer: Self-pay

## 2019-05-28 DIAGNOSIS — E119 Type 2 diabetes mellitus without complications: Secondary | ICD-10-CM | POA: Diagnosis present

## 2019-05-28 NOTE — Patient Instructions (Signed)
Plan:  Aim for 4 Carb Choices per meal (60 grams) +/- 1 either way  Aim for 0-2 Carbs per snack if hungry  Include protein in moderation with your meals and snacks Consider reading food labels for Total Carbohydrate of foods Continue with your activity level by walking for 60 minutes daily as tolerated Consider the idea of checking BG at alternate times per day a few days a week

## 2019-05-28 NOTE — Progress Notes (Signed)
Diabetes Self-Management Education  Visit Type: First/Initial  Appt. Start Time: 1400 Appt. End Time: [id number]  05/28/2019  Mr. Devin Ellison, identified by name and date of birth, is a 63 y.o. male with a diagnosis of Diabetes: Type 2. He is newly diagnosed and has not received any education on diabetes yet. He states he walks for about 75 minutes 6 days a week after work. He does not have a meter yet and is interested in learning about diabetes in general as well as the nutrition recommendations for diabetes.   ASSESSMENT  There were no vitals taken for this visit. There is no height or weight on file to calculate BMI.  Diabetes Self-Management Education - 05/28/19 1415      Visit Information   Visit Type  First/Initial      Initial Visit   Diabetes Type  Type 2    Are you currently following a meal plan?  No    Are you taking your medications as prescribed?  Not on Medications    Date Diagnosed  04/2019      Health Coping   How would you rate your overall health?  Good      Psychosocial Assessment   Patient Belief/Attitude about Diabetes  Afraid    Self-care barriers  None    Other persons present  Patient    Patient Concerns  Nutrition/Meal planning;Glycemic Control    Special Needs  None    Preferred Learning Style  Auditory;Visual;Hands on    [place]    How often do you need to have someone help you when you read instructions, pamphlets, or other written materials from your doctor or pharmacy?  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  Needs Instruction    Patient understands incorporating nutritional management into lifestyle.  Needs Instruction    Patient undertands incorporating physical activity into lifestyle.  Needs Instruction    Patient understands using medications safely.  Needs  Instruction    Patient understands monitoring blood glucose, interpreting and using results  Needs Instruction    Patient understands prevention, detection, and treatment of acute complications.  Needs Instruction    Patient understands prevention, detection, and treatment of chronic complications.  Needs Instruction    Patient understands how to develop strategies to address psychosocial issues.  Needs Instruction    Patient understands how to develop strategies to promote health/change behavior.  Needs Instruction      Complications   Last HgB A1C per patient/outside source  6.6 %    How often do you check your blood sugar?  0 times/day (not testing)    Have you had a dilated eye exam in the past 12 months?  Yes    Have you had a dental exam in the past 12 months?  Yes    Are you checking your feet?  Yes    How many days per week are you checking your feet?  7      Dietary Intake   Breakfast  skips daily    Lunch  depends on work on time of meal: brings from home- ham and swiss on rye bread, occasionally chips    Snack (afternoon)  occasionally corn nuts, not much else    Dinner  cooks at home: meat, starch and vegetables or salad, not usually  bread,    Snack (evening)  no    Beverage(s)  diet Dr. Malachi Bonds, no water usually during the day, sweet tea, beer x 2 each night or more if stressful day      Exercise   Exercise Type  Light (walking / raking leaves)    How many days per week to you exercise?  6    How many minutes per day do you exercise?  75    Total minutes per week of exercise  450      Patient Education   Previous Diabetes Education  No    Disease state   Definition of diabetes, type 1 and 2, and the diagnosis of diabetes;Factors that contribute to the development of diabetes;Explored patient's options for treatment of their diabetes    Nutrition management   Role of diet in the treatment of diabetes and the relationship between the three main macronutrients and blood  glucose level;Food label reading, portion sizes and measuring food.;Carbohydrate counting    Physical activity and exercise   Role of exercise on diabetes management, blood pressure control and cardiac health.;Helped patient identify appropriate exercises in relation to his/her diabetes, diabetes complications and other health issue.    Monitoring  Purpose and frequency of SMBG.;Identified appropriate SMBG and/or A1C goals.    Chronic complications  Relationship between chronic complications and blood glucose control    Psychosocial adjustment  Role of stress on diabetes      Individualized Goals (developed by patient)   Nutrition  Follow meal plan discussed    Physical Activity  Exercise 5-7 days per week    Medications  Not Applicable    Monitoring   test blood glucose pre and post meals as discussed      Post-Education Assessment   Patient understands the diabetes disease and treatment process.  Demonstrates understanding / competency    Patient understands incorporating nutritional management into lifestyle.  Demonstrates understanding / competency    Patient undertands incorporating physical activity into lifestyle.  Demonstrates understanding / competency    Patient understands monitoring blood glucose, interpreting and using results  Demonstrates understanding / competency    Patient understands prevention, detection, and treatment of acute complications.  Demonstrates understanding / competency    Patient understands prevention, detection, and treatment of chronic complications.  Demonstrates understanding / competency    Patient understands how to develop strategies to address psychosocial issues.  Demonstrates understanding / competency    Patient understands how to develop strategies to promote health/change behavior.  Demonstrates understanding / competency      Outcomes   Expected Outcomes  Demonstrated interest in learning. Expect positive outcomes    Future DMSE  PRN     Program Status  Not Completed       Individualized Plan for Diabetes Self-Management Training:   Learning Objective:  Patient will have a greater understanding of diabetes self-management. Patient education plan is to attend individual and/or group sessions per assessed needs and concerns.   Plan:   Patient Instructions  Plan:  Aim for 4 Carb Choices per meal (60 grams) +/- 1 either way  Aim for 0-2 Carbs per snack if hungry  Include protein in moderation with your meals and snacks Consider reading food labels for Total Carbohydrate of foods Continue with your activity level by walking for 60 minutes daily as tolerated Consider the idea of checking BG at alternate times per day a few days a week  Expected Outcomes:  Demonstrated interest in learning. Expect  positive outcomes  Education material provided: Food label handouts, A1C conversion sheet, Meal plan card and Carbohydrate counting sheet  If problems or questions, patient to contact team via:  Phone  Future DSME appointment: PRN

## 2019-06-19 ENCOUNTER — Other Ambulatory Visit: Payer: Self-pay | Admitting: Family Medicine

## 2019-06-19 DIAGNOSIS — I10 Essential (primary) hypertension: Secondary | ICD-10-CM

## 2019-09-20 ENCOUNTER — Other Ambulatory Visit: Payer: Self-pay | Admitting: Family Medicine

## 2019-09-20 DIAGNOSIS — I10 Essential (primary) hypertension: Secondary | ICD-10-CM

## 2019-09-20 NOTE — Telephone Encounter (Signed)
Forwarding medication refill request to the clinical pool for review. Refill: 0

## 2019-10-06 NOTE — Progress Notes (Signed)
QUICK REFERENCE INFORMATION: The ABCs of Providing the Annual Wellness Visit  CMS.gov Medicare Learning Network  [employer] Annual Wellness Visit  Subjective:   Devin Ellison is a 64 y.o. Male who presents for WELCOME TO MEDICARE VISIT.  Patient was diagnosed with Type 2 Diabetes based on hemoglobin a1c  Lab Results  Component Value Date   HGBA1C 6.6 (H) 04/02/2019   He was notified by mychart but did not follow up until today He has not been on any medications. Will do screening labs with his diabetes labs today. Last PSA was 9 months ago and normal.  Wt Readings from Last 3 Encounters:  10/07/19 237 lb 12.8 oz (107.9 kg)  04/02/19 237 lb 12.8 oz (107.9 kg)  09/25/18 239 lb (108.4 kg)    Patient Active Problem List   Diagnosis Date Noted  . Dermatitis of left ear canal 10/14/2018  . Essential hypertension 03/13/2017  . Benign colon polyp 04/11/2016  . MDD (major depressive disorder), single episode 04/30/2013  . Suicidal ideation 04/30/2013    Past Medical History:  Diagnosis Date  . Bipolar 1 disorder ([place])    in remission  . Depression   . Hypertension      Past Surgical History:  Procedure Laterality Date  . ANKLE SURGERY Right   . COLON SURGERY     polypectomy  . HAND SURGERY Right    after fx     Outpatient Medications Prior to Visit  Medication Sig Dispense Refill  . amLODipine (NORVASC) 10 MG tablet Take 1 tablet (10 mg total) by mouth daily. 90 tablet 3  . diazepam (VALIUM) 5 MG tablet TAKE 1 TO 2 TABLETS TWICE DAILY AS NEEDED    . lamoTRIgine (LAMICTAL) 200 MG tablet Take 1 tablet (200 mg total) by mouth daily. For mood disorder and depression, (Patient taking differently: Take 200 mg by mouth 2 (two) times daily. For mood disorder and depression,) 30 tablet 0  . losartan (COZAAR) 100 MG tablet TAKE 1 TABLET BY MOUTH EVERY DAY 90 tablet 0  . VIAGRA 100 MG tablet TAKE 1/2 TO 1 TABLET BY MOUTH ONCE DAILY AS NEEDED FOR ERECTILE DYSFUNCTION 5 tablet 7   . VRAYLAR capsule Take 1.5 mg by mouth daily.     No facility-administered medications prior to visit.    No Known Allergies   Family History  Problem Relation Age of Onset  . Heart disease Mother   . Cerebral palsy Sister 27  . Leukemia Sister   . Breast cancer Sister      Social History   Socioeconomic History  . Marital status: Married    Spouse name: Not on file  . Number of children: Not on file  . Years of education: Not on file  . Highest education level: Not on file  Occupational History  . Not on file  Tobacco Use  . Smoking status: Former Smoker    Packs/day: 1.00    Types: Cigarettes    Quit date: 08/13/2013    Years since quitting: 6.1  . Smokeless tobacco: Never Used  Substance and Sexual Activity  . Alcohol use: Yes    Alcohol/week: 0.0 standard drinks    Comment: 6 beers/week  . Drug use: No  . Sexual activity: Not on file  Other Topics Concern  . Not on file  Social History Narrative  . Not on file   Social Determinants of Health   Financial Resource Strain:   . Difficulty of Paying Living Expenses: Not on file  Food Insecurity:   . Worried About Charity fundraiser in the Last Year: Not on file  . Ran Out of Food in the Last Year: Not on file  Transportation Needs:   . Lack of Transportation (Medical): Not on file  . Lack of Transportation (Non-Medical): Not on file  Physical Activity:   . Days of Exercise per Week: Not on file  . Minutes of Exercise per Session: Not on file  Stress:   . Feeling of Stress : Not on file  Social Connections:   . Frequency of Communication with Friends and Family: Not on file  . Frequency of Social Gatherings with Friends and Family: Not on file  . Attends Religious Services: Not on file  . Active Member of Clubs or Organizations: Not on file  . Attends Archivist Meetings: Not on file  . Marital Status: Not on file      Recent Hospitalizations? No  Health Habits: Current exercise  activities include: walking Exercise: 4 times/week. Diet: in general, a "healthy" diet    Alcohol intake: moderate - 4-5 drinks per week (beers)  Health Risk Assessment: The patient has completed a Health Risk Assessment. This has been reveiwed with them and has been scanned into the [place] system as an attached document.  Current Medical Providers and Suppliers: [facility] Patient Care Team: Forrest Moron, MD as PCP - General (Internal Medicine) No future appointments.   Age-appropriate Screening Schedule: The list below includes current immunization status and future screening recommendations based on patient's age. Orders for these recommended tests are listed in the plan section. The patient has been provided with a written plan. Immunization History  Administered Date(s) Administered  . Influenza-Unspecified 06/04/2017  . Tdap 02/07/2013    Health Maintenance reviewed -  urine microalbumin ordered, patient asked to schedule diabetic eye exam, glycohemoglobin ordered   Depression Screen-PHQ2/9 completed today  Depression screen [facility] 2/9 10/07/2019 05/28/2019 04/15/2019 04/02/2019 09/25/2018  Decreased Interest 2 0 0 0 1  Down, Depressed, Hopeless 2 1 1 1 1   PHQ - 2 Score 4 1 1 1 2   Altered sleeping 0 - - - 1  Tired, decreased energy 0 - - - 1  Change in appetite 2 - - - 3  Feeling bad or failure about yourself  2 - - - 2  Trouble concentrating 0 - - - 3  Moving slowly or fidgety/restless 0 - - - 0  Suicidal thoughts 0 - - - 0  PHQ-9 Score 8 - - - 12  Difficult doing work/chores Not difficult at all - - - Somewhat difficult       Depression Severity and Treatment Recommendations:  0-4= None  5-9= Mild / Treatment: Support, educate to call if worse; return in one month  10-14= Moderate / Treatment: Support, watchful waiting; Antidepressant or Psycotherapy  15-19= Moderately severe / Treatment: Antidepressant OR Psychotherapy  >= 20 = Major depression, severe / Antidepressant  AND Psychotherapy  Functional Status Survey:   Is the patient deaf or have difficulty hearing?: yes Does the patient have difficulty seeing, even when wearing glasses/contacts?: No Does the patient have difficulty concentrating, remembering, or making decisions?: No Does the patient have difficulty walking or climbing stairs?: No Does the patient have difficulty dressing or bathing?: No Does the patient have difficulty doing errands alone such as visiting a doctor's office or shopping?: No    Hearing Evaluation: 1. Do you have trouble hearing the television when others do not?  Yes 2. Do you have to strain to hear/understand conversations? No   Advanced Care Planning: 1. Patient has executed an Advance Directive: Yes 2. If no, patient was given the opportunity to execute an Advance Directive today? Yes 3. Are the patient's advanced directives in [place]? No 4. This patient has the ability to prepare an Advance Directive: Yes 5. Provider is willing to follow the patient's wishes: Yes  Cognitive Assessment: Does the patient have evidence of cognitive impairment? No The patient does not have any evidence of any cognitive problems and denies any  change in mood/affect, appearance, speech, memory or motor skills.  Identification of Risk Factors: Risk factors include: diabetes mellitus and hypertension  ROS Review of Systems  Constitutional: Negative for activity change, appetite change, chills and fever.  HENT: Negative for congestion, nosebleeds, trouble swallowing and voice change.   Respiratory: Negative for cough, shortness of breath and wheezing.   Gastrointestinal: Negative for diarrhea, nausea and vomiting.  Genitourinary: Negative for difficulty urinating, dysuria, flank pain and hematuria.  Musculoskeletal: Negative for back pain, joint swelling and neck pain.  Neurological: Negative for dizziness, speech difficulty, light-headedness and numbness.  See HPI. All other review  of systems negative.   Objective:   Vitals:   10/07/19 0807 10/07/19 0831  BP: (!) 150/92 122/70  Pulse: 72   Temp: 98.6 F (37 C)   TempSrc: Temporal   SpO2: 92%   Weight: 237 lb 12.8 oz (107.9 kg)   Height: 6' (1.829 m)     Body mass index is 32.25 kg/m.  Physical Exam  Constitutional: Oriented to person, place, and time. Appears well-developed and well-nourished.  HENT:  Head: Normocephalic and atraumatic.  Eyes: Conjunctivae and EOM are normal.  Neck: supple, no thyromegaly Cardiovascular: Normal rate, regular rhythm, normal heart sounds and intact distal pulses.  No murmur heard. Pulmonary/Chest: Effort normal and breath sounds normal. No stridor. No respiratory distress. Has no wheezes.  Abdomen: nondistended, normoactive bs, soft, nontender Neurological: Is alert and oriented to person, place, and time.  Skin: Skin is warm. Capillary refill takes less than 2 seconds.  Psychiatric: Has a normal mood and affect. Behavior is normal. Judgment and thought content normal.     ECG - NSR, no arrhythmia   Assessment/Plan:   Patient Self-Management and Personalized Health Advice The patient has been provided with information about:  attempt to lose weight and continue current healthy lifestyle patterns  During the course of the visit the patient was educated and counseled about appropriate screening and preventive services including:   return annually or prn     Body mass index is 32.25 kg/m. Discussed the patient's BMI with him. The BMI BMI is not in the acceptable range; BMI management plan is completed  Devin Ellison was seen today for medicare wellness.  Diagnoses and all orders for this visit:  Encounter for Medicare annual wellness exam -     Cancel: EKG 12-Lead -     Cancel: CMP14+EGFR -     Cancel: Lipid panel -     Cancel: Microalbumin, urine -     EKG 12-Lead  Diabetes mellitus, new onset (HCC) -     Cancel: POCT glycosylated hemoglobin (Hb A1C) -      CMP14+EGFR -     POCT glycosylated hemoglobin (Hb A1C) -     Microalbumin, urine -     Lipid panel -     HM DIABETES FOOT EXAM  Encounter for screening for cardiovascular disorders -  EKG 12-Lead  Need for prophylactic vaccination against Streptococcus pneumoniae (pneumococcus) -     Pneumococcal conjugate vaccine 13-valent IM   Additional Problem today Discussed his diabetes, a1c 6.7% today, still well controlled.  He went to diabetes education He is exercising and adhering to a diabetic diet Reviewed Diabetes Standard of Care in addition to his medicare wellness exam  Prevnar (PCV-13) given today Discussed routine vision and dental exam   No follow-ups on file.  No future appointments.  Patient Instructions       If you have lab work done today you will be contacted with your lab results within the next 2 weeks.  If you have not heard from [place] then please contact us. The fastest way to get your results is to register for My Chart.   IF you received an x-ray today, you will receive an invoice from [facility]. Please contact [facility] at [phone] with questions or concerns regarding your invoice.   IF you received labwork today, you will receive an invoice from [facility]. Please contact [facility] at [phone] with questions or concerns regarding your invoice.   Our billing staff will not be able to assist you with questions regarding bills from these companies.  You will be contacted with the lab results as soon as they are available. The fastest way to get your results is to activate your My Chart account. Instructions are located on the last page of this paperwork. If you have not heard from [place] regarding the results in 2 weeks, please contact this office.        An after visit summary with all of these plans was given to the patient.

## 2019-10-07 ENCOUNTER — Other Ambulatory Visit: Payer: Self-pay

## 2019-10-07 ENCOUNTER — Ambulatory Visit (INDEPENDENT_AMBULATORY_CARE_PROVIDER_SITE_OTHER): Payer: Medicare Other | Admitting: Family Medicine

## 2019-10-07 ENCOUNTER — Encounter: Payer: Self-pay | Admitting: Family Medicine

## 2019-10-07 VITALS — BP 122/70 | HR 72 | Temp 98.6°F | Ht 72.0 in | Wt 237.8 lb

## 2019-10-07 DIAGNOSIS — Z0001 Encounter for general adult medical examination with abnormal findings: Secondary | ICD-10-CM

## 2019-10-07 DIAGNOSIS — E119 Type 2 diabetes mellitus without complications: Secondary | ICD-10-CM

## 2019-10-07 DIAGNOSIS — H9193 Unspecified hearing loss, bilateral: Secondary | ICD-10-CM

## 2019-10-07 DIAGNOSIS — Z0000 Encounter for general adult medical examination without abnormal findings: Secondary | ICD-10-CM

## 2019-10-07 DIAGNOSIS — Z136 Encounter for screening for cardiovascular disorders: Secondary | ICD-10-CM | POA: Diagnosis not present

## 2019-10-07 DIAGNOSIS — Z23 Encounter for immunization: Secondary | ICD-10-CM | POA: Diagnosis not present

## 2019-10-07 LAB — POCT GLYCOSYLATED HEMOGLOBIN (HGB A1C): Hemoglobin A1C: 6.7 % — AB (ref 4.0–5.6)

## 2019-10-07 NOTE — Patient Instructions (Signed)
° ° ° °  If you have lab work done today you will be contacted with your lab results within the next 2 weeks.  If you have not heard from us then please contact us. The fastest way to get your results is to register for My Chart. ° ° °IF you received an x-ray today, you will receive an invoice from [facility]. Please contact [facility] at [phone] with questions or concerns regarding your invoice.  ° °IF you received labwork today, you will receive an invoice from [facility]. Please contact [facility] at [phone] with questions or concerns regarding your invoice.  ° °Our billing staff will not be able to assist you with questions regarding bills from these companies. ° °You will be contacted with the lab results as soon as they are available. The fastest way to get your results is to activate your My Chart account. Instructions are located on the last page of this paperwork. If you have not heard from us regarding the results in 2 weeks, please contact this office. °  ° ° ° °

## 2019-10-08 LAB — CMP14+EGFR
ALT: 20 IU/L (ref 0–44)
AST: 20 IU/L (ref 0–40)
Albumin/Globulin Ratio: 2.1 (ref 1.2–2.2)
Albumin: 4.6 g/dL (ref 3.8–4.8)
Alkaline Phosphatase: 93 IU/L (ref 39–117)
BUN/Creatinine Ratio: 12 (ref 10–24)
BUN: 13 mg/dL (ref 8–27)
Bilirubin Total: 0.6 mg/dL (ref 0.0–1.2)
CO2: 23 mmol/L (ref 20–29)
Calcium: 9.3 mg/dL (ref 8.6–10.2)
Chloride: 104 mmol/L (ref 96–106)
Creatinine, Ser: 1.06 mg/dL (ref 0.76–1.27)
GFR calc Af Amer: 85 mL/min/{1.73_m2} (ref >59)
GFR calc non Af Amer: 73 mL/min/{1.73_m2} (ref >59)
Globulin, Total: 2.2 g/dL (ref 1.5–4.5)
Glucose: 151 mg/dL — ABNORMAL HIGH (ref 65–99)
Potassium: 4.5 mmol/L (ref 3.5–5.2)
Sodium: 140 mmol/L (ref 134–144)
Total Protein: 6.8 g/dL (ref 6.0–8.5)

## 2019-10-08 LAB — LIPID PANEL
Chol/HDL Ratio: 2.9 ratio (ref 0.0–5.0)
Cholesterol, Total: 173 mg/dL (ref 100–199)
HDL: 59 mg/dL (ref >39)
LDL Chol Calc (NIH): 96 mg/dL (ref 0–99)
Triglycerides: 101 mg/dL (ref 0–149)
VLDL Cholesterol Cal: 18 mg/dL (ref 5–40)

## 2019-10-08 LAB — MICROALBUMIN, URINE: Microalbumin, Urine: 4.2 ug/mL

## 2019-11-03 ENCOUNTER — Ambulatory Visit: Payer: Self-pay | Attending: Internal Medicine

## 2019-11-03 DIAGNOSIS — Z23 Encounter for immunization: Secondary | ICD-10-CM | POA: Insufficient documentation

## 2019-11-03 NOTE — Progress Notes (Signed)
   Covid-19 Vaccination Clinic  Name:  Devin Ellison    MRN: [id number] DOB: 1956-07-04  11/03/2019  Devin Ellison was observed post Covid-19 immunization for 15 minutes without incidence. He was provided with Vaccine Information Sheet and instruction to access the V-Safe system.   Devin Ellison was instructed to call 911 with any severe reactions post vaccine: Marland Kitchen Difficulty breathing  . Swelling of your face and throat  . A fast heartbeat  . A bad rash all over your body  . Dizziness and weakness    Immunizations Administered    Name Date Dose VIS Date Route   Pfizer COVID-19 Vaccine 11/03/2019  8:38 AM 0.3 mL 08/16/2019 Intramuscular   Manufacturer: [place]   Lot: [id number]   [place]: [id number]

## 2019-11-27 ENCOUNTER — Ambulatory Visit: Payer: Self-pay

## 2019-12-03 ENCOUNTER — Ambulatory Visit: Payer: Self-pay | Attending: Internal Medicine

## 2019-12-03 DIAGNOSIS — Z23 Encounter for immunization: Secondary | ICD-10-CM

## 2019-12-03 NOTE — Progress Notes (Signed)
   Covid-19 Vaccination Clinic  Name:  JULIOCESAR NAUTA    MRN: [id number] DOB: February 28, 1956  12/03/2019  Mr. Spaulding was observed post Covid-19 immunization for 15 minutes without incident. He was provided with Vaccine Information Sheet and instruction to access the V-Safe system.   Mr. Juedes was instructed to call 911 with any severe reactions post vaccine: Marland Kitchen Difficulty breathing  . Swelling of face and throat  . A fast heartbeat  . A bad rash all over body  . Dizziness and weakness   Immunizations Administered    Name Date Dose VIS Date Route   Pfizer COVID-19 Vaccine 12/03/2019  4:17 PM 0.3 mL 08/16/2019 Intramuscular   Manufacturer: [place]   Lot: [id number]   [place]: [id number]

## 2019-12-16 ENCOUNTER — Other Ambulatory Visit: Payer: Self-pay | Admitting: Family Medicine

## 2019-12-16 DIAGNOSIS — I10 Essential (primary) hypertension: Secondary | ICD-10-CM

## 2020-03-16 ENCOUNTER — Other Ambulatory Visit: Payer: Self-pay

## 2020-03-16 DIAGNOSIS — I10 Essential (primary) hypertension: Secondary | ICD-10-CM

## 2020-03-16 MED ORDER — LOSARTAN POTASSIUM 100 MG PO TABS: 100.0000 mg | ORAL_TABLET | Freq: Every day | ORAL | 0 refills | Status: DC

## 2020-04-06 ENCOUNTER — Telehealth: Payer: Self-pay | Admitting: Family Medicine

## 2020-04-06 ENCOUNTER — Ambulatory Visit: Payer: Medicare Other | Admitting: Family Medicine

## 2020-04-06 NOTE — Telephone Encounter (Signed)
04/06/2020 - PATIENT CAME INTO THE OFFICE FOR HIS 8:00am APPOINTMENT.I DID NOT SEE HIS NAME ON MY LIST SO I ASKED HIM WHO THE APPOINTMENT WAS WITH. HE SAID DR. STALLINGS. I EXPLAINED TO HIM THAT DR. STALLINGS WAS NO LONGER WITH OUR PRACTICE. HE TOLD ME THE APPOINTMENT WAS MADE 6 MONTHS AGO AND THAT WE ARE ALWAYS DOING THINGS LIKE THIS. HE SAID HE TOOK OFF OF WORK FOR THIS APPOINTMENT. I ASKED HIM IF HE RECEIVED A LETTER AND IF HE HAD THE SAME ADDRESS. HE SAID HE HAD MOVED BUT ALL HIS MAIL WAS FORWARDED TO HIS NEW ADDRESS. HE SAID HE WOULD NOT COME BACK TO THIS OFFICE AGAIN. GINA WAS A WITNESS AND HEARD THE WHOLE CONVERSATION. [place]

## 2020-06-12 ENCOUNTER — Other Ambulatory Visit: Payer: Self-pay | Admitting: Family Medicine

## 2020-06-12 DIAGNOSIS — I10 Essential (primary) hypertension: Secondary | ICD-10-CM

## 2020-06-12 NOTE — Telephone Encounter (Signed)
Requested medication (s) are due for refill today: yes  Requested medication (s) are on the active medication list: yes  Last refill:  03/21/2020  Future visit scheduled: no  Notes to clinic:  Patient had appointment but provider is no longer with practice   Requested Prescriptions  Pending Prescriptions Disp Refills   losartan (COZAAR) 100 MG tablet [Pharmacy Med Name: LOSARTAN POTASSIUM 100 MG TAB] 90 tablet 0    Sig: TAKE 1 TABLET BY MOUTH EVERY DAY      Cardiovascular:  Angiotensin Receptor Blockers Failed - 06/12/2020  1:24 AM      Failed - Cr in normal range and within 180 days    Creat  Date Value Ref Range Status  11/09/2014 0.92 0.50 - 1.35 mg/dL Final   Creatinine, Ser  Date Value Ref Range Status  10/07/2019 1.06 0.76 - 1.27 mg/dL Final          Failed - K in normal range and within 180 days    Potassium  Date Value Ref Range Status  10/07/2019 4.5 3.5 - 5.2 mmol/L Final          Failed - Valid encounter within last 6 months    Recent Outpatient Visits           8 months ago Encounter for Medicare annual wellness exam   Primary Care at [facility], Arlie Solomons, MD   1 year ago Newly diagnosed diabetes [facility])   Primary Care at [facility], Arlie Solomons, MD   1 year ago Essential hypertension   Primary Care at [facility], Arlie Solomons, MD   1 year ago Essential hypertension   Primary Care at [facility], Arlie Solomons, MD   2 years ago Essential hypertension   Primary Care at [facility] [place], [place]              Passed - Patient is not pregnant      Passed - Last BP in normal range    BP Readings from Last 1 Encounters:  10/07/19 122/70

## 2021-05-08 ENCOUNTER — Ambulatory Visit (HOSPITAL_COMMUNITY)
Admission: EM | Admit: 2021-05-08 | Discharge: 2021-05-08 | Disposition: A | Discharge status: Home / Self Care | Payer: Medicare Other | Attending: Nurse Practitioner | Admitting: Nurse Practitioner

## 2021-05-08 ENCOUNTER — Other Ambulatory Visit: Payer: Self-pay

## 2021-05-08 DIAGNOSIS — G4700 Insomnia, unspecified: Secondary | ICD-10-CM | POA: Diagnosis not present

## 2021-05-08 DIAGNOSIS — F419 Anxiety disorder, unspecified: Secondary | ICD-10-CM | POA: Diagnosis not present

## 2021-05-08 DIAGNOSIS — Z79899 Other long term (current) drug therapy: Secondary | ICD-10-CM | POA: Diagnosis not present

## 2021-05-08 DIAGNOSIS — F310 Bipolar disorder, current episode hypomanic: Secondary | ICD-10-CM | POA: Insufficient documentation

## 2021-05-08 MED ORDER — QUETIAPINE FUMARATE ER 200 MG PO TB24: 200.0000 mg | ORAL_TABLET | Freq: Every day | ORAL | 0 refills | Status: DC

## 2021-05-08 NOTE — Discharge Instructions (Addendum)
  Discharge recommendations:  Patient is to take medications as prescribed. Please see information for follow-up appointment with psychiatry and therapy. Please follow up with your primary care provider for all medical related needs.  Patient will keep upcoming appointment with therapist Fred May. Patient will schedule an appointment with psychiatrist Dr. Hulda Marin in the next week.  Therapy: We recommend that patient participate in individual therapy to address mental health concerns.  Medications: The Patient is to contact a medical professional and/or outpatient provider to address any new side effects that develop. Patient should update outpatient providers of any new medications and/or medication changes.   Atypical antipsychotics: If you are prescribed an atypical antipsychotic, it is recommended that your height, weight, BMI, blood pressure, fasting lipid panel, and fasting blood sugar be monitored by your outpatient providers.  Safety:  The patient should abstain from use of illicit substances/drugs and abuse of any medications. If symptoms worsen or do not continue to improve or if the patient becomes actively suicidal or homicidal then it is recommended that the patient return to the closest hospital emergency department, the [facility], or call 911 for further evaluation and treatment. National Suicide Prevention Lifeline 1-800-SUICIDE or 437-546-4417.
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
## 2021-05-08 NOTE — ED Notes (Signed)
Pt d/c home with prescription and reading information on bipolar disorder. Pt and family verbalizes understanding of d/c instructions no questions were asked

## 2021-05-09 ENCOUNTER — Ambulatory Visit (INDEPENDENT_AMBULATORY_CARE_PROVIDER_SITE_OTHER)
Admission: EM | Admit: 2021-05-09 | Discharge: 2021-05-10 | Disposition: A | Discharge status: Other Acute Inpatient Hospital | Payer: Medicare Other | Source: Home / Self Care

## 2021-05-09 ENCOUNTER — Other Ambulatory Visit: Payer: Self-pay

## 2021-05-09 DIAGNOSIS — Z20822 Contact with and (suspected) exposure to covid-19: Secondary | ICD-10-CM | POA: Insufficient documentation

## 2021-05-09 DIAGNOSIS — Z046 Encounter for general psychiatric examination, requested by authority: Secondary | ICD-10-CM | POA: Diagnosis not present

## 2021-05-09 DIAGNOSIS — F312 Bipolar disorder, current episode manic severe with psychotic features: Secondary | ICD-10-CM | POA: Diagnosis not present

## 2021-05-09 DIAGNOSIS — R45851 Suicidal ideations: Secondary | ICD-10-CM | POA: Diagnosis not present

## 2021-05-09 DIAGNOSIS — F3111 Bipolar disorder, current episode manic without psychotic features, mild: Secondary | ICD-10-CM | POA: Diagnosis not present

## 2021-05-09 DIAGNOSIS — I10 Essential (primary) hypertension: Secondary | ICD-10-CM | POA: Insufficient documentation

## 2021-05-09 DIAGNOSIS — Z7984 Long term (current) use of oral hypoglycemic drugs: Secondary | ICD-10-CM | POA: Diagnosis not present

## 2021-05-09 DIAGNOSIS — F3112 Bipolar disorder, current episode manic without psychotic features, moderate: Secondary | ICD-10-CM | POA: Diagnosis not present

## 2021-05-09 DIAGNOSIS — F319 Bipolar disorder, unspecified: Secondary | ICD-10-CM | POA: Diagnosis present

## 2021-05-09 DIAGNOSIS — Z79899 Other long term (current) drug therapy: Secondary | ICD-10-CM | POA: Diagnosis not present

## 2021-05-09 DIAGNOSIS — Z86010 Personal history of colonic polyps: Secondary | ICD-10-CM | POA: Diagnosis not present

## 2021-05-09 LAB — LIPID PANEL
Cholesterol: 143 mg/dL (ref 0–200)
HDL: 52 mg/dL (ref >40)
LDL Cholesterol: 57 mg/dL (ref 0–99)
Total CHOL/HDL Ratio: 2.8 RATIO
Triglycerides: 170 mg/dL — ABNORMAL HIGH (ref <150)
VLDL: 34 mg/dL (ref 0–40)

## 2021-05-09 LAB — CBC WITH DIFFERENTIAL/PLATELET
Abs Immature Granulocytes: 0.04 10*3/uL (ref 0.00–0.07)
Basophils Absolute: 0.1 10*3/uL (ref 0.0–0.1)
Basophils Relative: 1 %
Eosinophils Absolute: 0.3 10*3/uL (ref 0.0–0.5)
Eosinophils Relative: 3 %
HCT: 44.5 % (ref 39.0–52.0)
Hemoglobin: 15 g/dL (ref 13.0–17.0)
Immature Granulocytes: 0 %
Lymphocytes Relative: 16 %
Lymphs Abs: 1.5 10*3/uL (ref 0.7–4.0)
MCH: 32.6 pg (ref 26.0–34.0)
MCHC: 33.7 g/dL (ref 30.0–36.0)
MCV: 96.7 fL (ref 80.0–100.0)
Monocytes Absolute: 1 10*3/uL (ref 0.1–1.0)
Monocytes Relative: 11 %
Neutro Abs: 6.4 10*3/uL (ref 1.7–7.7)
Neutrophils Relative %: 69 %
Platelets: 291 10*3/uL (ref 150–400)
RBC: 4.6 MIL/uL (ref 4.22–5.81)
RDW: 12.5 % (ref 11.5–15.5)
WBC: 9.2 10*3/uL (ref 4.0–10.5)
nRBC: 0 % (ref 0.0–0.2)

## 2021-05-09 LAB — COMPREHENSIVE METABOLIC PANEL
ALT: 40 U/L (ref 0–44)
AST: 26 U/L (ref 15–41)
Albumin: 3.9 g/dL (ref 3.5–5.0)
Alkaline Phosphatase: 67 U/L (ref 38–126)
Anion gap: 9 (ref 5–15)
BUN: 17 mg/dL (ref 8–23)
CO2: 24 mmol/L (ref 22–32)
Calcium: 9.5 mg/dL (ref 8.9–10.3)
Chloride: 103 mmol/L (ref 98–111)
Creatinine, Ser: 1.1 mg/dL (ref 0.61–1.24)
GFR, Estimated: >60 mL/min (ref >60)
Glucose, Bld: 276 mg/dL — ABNORMAL HIGH (ref 70–99)
Potassium: 3.8 mmol/L (ref 3.5–5.1)
Sodium: 136 mmol/L (ref 135–145)
Total Bilirubin: 0.5 mg/dL (ref 0.3–1.2)
Total Protein: 6.2 g/dL — ABNORMAL LOW (ref 6.5–8.1)

## 2021-05-09 LAB — HEMOGLOBIN A1C
Hgb A1c MFr Bld: 7.2 % — ABNORMAL HIGH (ref 4.8–5.6)
Mean Plasma Glucose: 159.94 mg/dL

## 2021-05-09 LAB — POCT URINE DRUG SCREEN - MANUAL ENTRY (I-SCREEN)
POC Amphetamine UR: NOT DETECTED
POC Buprenorphine (BUP): NOT DETECTED
POC Cocaine UR: NOT DETECTED
POC Marijuana UR: NOT DETECTED
POC Methadone UR: NOT DETECTED
POC Methamphetamine UR: NOT DETECTED
POC Morphine: NOT DETECTED
POC Oxazepam (BZO): NOT DETECTED
POC Oxycodone UR: NOT DETECTED
POC Secobarbital (BAR): NOT DETECTED

## 2021-05-09 LAB — POC SARS CORONAVIRUS 2 AG -  ED: SARS Coronavirus 2 Ag: NEGATIVE

## 2021-05-09 LAB — RESP PANEL BY RT-PCR (FLU A&B, COVID) ARPGX2
Influenza A by PCR: NEGATIVE
Influenza B by PCR: NEGATIVE
SARS Coronavirus 2 by RT PCR: NEGATIVE

## 2021-05-09 LAB — TSH: TSH: 0.63 u[IU]/mL (ref 0.350–4.500)

## 2021-05-09 LAB — MAGNESIUM: Magnesium: 2 mg/dL (ref 1.7–2.4)

## 2021-05-09 MED ORDER — LAMOTRIGINE 100 MG PO TABS
200.0000 mg | ORAL_TABLET | Freq: Two times a day (BID) | ORAL | Status: DC
Start: 2021-05-09 — End: 2021-05-10
  Administered 2021-05-09 – 2021-05-10 (×2): 200 mg via ORAL
  Filled 2021-05-09 (×2): qty 2

## 2021-05-09 MED ORDER — QUETIAPINE FUMARATE ER 200 MG PO TB24
200.0000 mg | ORAL_TABLET | Freq: Every day | ORAL | Status: DC
Start: 2021-05-09 — End: 2021-05-10
  Administered 2021-05-09: 200 mg via ORAL
  Filled 2021-05-09: qty 1

## 2021-05-09 MED ORDER — MAGNESIUM HYDROXIDE 400 MG/5ML PO SUSP: 30.0000 mL | Freq: Every day | ORAL | Status: DC | PRN

## 2021-05-09 MED ORDER — AMLODIPINE BESYLATE 10 MG PO TABS
10.0000 mg | ORAL_TABLET | Freq: Every day | ORAL | Status: DC
  Administered 2021-05-09 – 2021-05-10 (×2): 10 mg via ORAL
  Filled 2021-05-09 (×2): qty 1

## 2021-05-09 MED ORDER — ACETAMINOPHEN 325 MG PO TABS: 650.0000 mg | ORAL_TABLET | Freq: Four times a day (QID) | ORAL | Status: DC | PRN

## 2021-05-09 MED ORDER — LOSARTAN POTASSIUM 50 MG PO TABS
100.0000 mg | ORAL_TABLET | Freq: Every day | ORAL | Status: DC
  Administered 2021-05-10: 100 mg via ORAL
  Filled 2021-05-09: qty 2

## 2021-05-09 MED ORDER — ALUM & MAG HYDROXIDE-SIMETH 200-200-20 MG/5ML PO SUSP: 30.0000 mL | ORAL | Status: DC | PRN
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
## 2021-05-09 NOTE — ED Notes (Addendum)
Pt A&O x 4, presents with Manic behavior as per pt & family.  Pt also aggressive towards his wife.  Pt admits to excessive spending and risky behaviors.  Skin search completed, monitoring for safety.
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
## 2021-05-10 ENCOUNTER — Emergency Department (HOSPITAL_COMMUNITY)
Admission: EM | Admit: 2021-05-10 | Discharge: 2021-05-12 | Disposition: A | Discharge status: Home / Self Care | Payer: Medicare Other | Attending: Emergency Medicine | Admitting: Emergency Medicine

## 2021-05-10 ENCOUNTER — Other Ambulatory Visit: Payer: Self-pay

## 2021-05-10 DIAGNOSIS — R45851 Suicidal ideations: Secondary | ICD-10-CM

## 2021-05-10 DIAGNOSIS — F3111 Bipolar disorder, current episode manic without psychotic features, mild: Secondary | ICD-10-CM | POA: Insufficient documentation

## 2021-05-10 DIAGNOSIS — F329 Major depressive disorder, single episode, unspecified: Secondary | ICD-10-CM | POA: Diagnosis present

## 2021-05-10 DIAGNOSIS — Z20822 Contact with and (suspected) exposure to covid-19: Secondary | ICD-10-CM | POA: Insufficient documentation

## 2021-05-10 DIAGNOSIS — F99 Mental disorder, not otherwise specified: Secondary | ICD-10-CM

## 2021-05-10 DIAGNOSIS — Z7984 Long term (current) use of oral hypoglycemic drugs: Secondary | ICD-10-CM | POA: Insufficient documentation

## 2021-05-10 DIAGNOSIS — Z046 Encounter for general psychiatric examination, requested by authority: Secondary | ICD-10-CM | POA: Insufficient documentation

## 2021-05-10 DIAGNOSIS — F3110 Bipolar disorder, current episode manic without psychotic features, unspecified: Secondary | ICD-10-CM | POA: Diagnosis present

## 2021-05-10 DIAGNOSIS — I10 Essential (primary) hypertension: Secondary | ICD-10-CM | POA: Insufficient documentation

## 2021-05-10 DIAGNOSIS — Z79899 Other long term (current) drug therapy: Secondary | ICD-10-CM | POA: Insufficient documentation

## 2021-05-10 DIAGNOSIS — Z86010 Personal history of colonic polyps: Secondary | ICD-10-CM | POA: Insufficient documentation

## 2021-05-10 LAB — RESP PANEL BY RT-PCR (FLU A&B, COVID) ARPGX2
Influenza A by PCR: NEGATIVE
Influenza B by PCR: NEGATIVE
SARS Coronavirus 2 by RT PCR: NEGATIVE

## 2021-05-10 MED ORDER — ATORVASTATIN CALCIUM 10 MG PO TABS
10.0000 mg | ORAL_TABLET | Freq: Every day | ORAL | Status: DC
  Administered 2021-05-10 – 2021-05-12 (×3): 10 mg via ORAL
  Filled 2021-05-10 (×3): qty 1

## 2021-05-10 MED ORDER — METFORMIN HCL 500 MG PO TABS: 1000.0000 mg | ORAL_TABLET | Freq: Every day | ORAL | Status: DC

## 2021-05-10 MED ORDER — OLANZAPINE 10 MG IM SOLR: 10.0000 mg | Freq: Once | INTRAMUSCULAR | Status: DC

## 2021-05-10 MED ORDER — LAMOTRIGINE 100 MG PO TABS
200.0000 mg | ORAL_TABLET | Freq: Two times a day (BID) | ORAL | Status: DC
  Administered 2021-05-10 – 2021-05-12 (×4): 200 mg via ORAL
  Filled 2021-05-10 (×4): qty 2

## 2021-05-10 MED ORDER — QUETIAPINE FUMARATE ER 200 MG PO TB24
200.0000 mg | ORAL_TABLET | Freq: Every day | ORAL | Status: DC
  Administered 2021-05-10 – 2021-05-11 (×2): 200 mg via ORAL
  Filled 2021-05-10 (×4): qty 1

## 2021-05-10 MED ORDER — LOSARTAN POTASSIUM 50 MG PO TABS
100.0000 mg | ORAL_TABLET | Freq: Every day | ORAL | Status: DC
  Administered 2021-05-10 – 2021-05-12 (×3): 100 mg via ORAL
  Filled 2021-05-10 (×3): qty 2

## 2021-05-10 MED ORDER — QUETIAPINE FUMARATE ER 200 MG PO TB24: 200.0000 mg | ORAL_TABLET | Freq: Every day | ORAL | Status: DC

## 2021-05-10 MED ORDER — AMLODIPINE BESYLATE 5 MG PO TABS
5.0000 mg | ORAL_TABLET | Freq: Every day | ORAL | Status: DC
  Administered 2021-05-10 – 2021-05-12 (×3): 5 mg via ORAL
  Filled 2021-05-10 (×3): qty 1

## 2021-05-10 MED ORDER — LAMOTRIGINE 200 MG PO TABS: 200.0000 mg | ORAL_TABLET | Freq: Two times a day (BID) | ORAL | Status: DC

## 2021-05-10 MED ORDER — OLANZAPINE 10 MG PO TABS: 10.0000 mg | ORAL_TABLET | Freq: Once | ORAL | Status: DC

## 2021-05-10 MED ORDER — METFORMIN HCL 500 MG PO TABS
1000.0000 mg | ORAL_TABLET | Freq: Every day | ORAL | Status: DC
  Administered 2021-05-11 – 2021-05-12 (×2): 1000 mg via ORAL
  Filled 2021-05-10 (×2): qty 2

## 2021-05-10 NOTE — ED Notes (Signed)
Pt sleeping at present.  No distress noted.  Monitoring for safety. 

## 2021-05-10 NOTE — Progress Notes (Addendum)
Per [facility], no available beds at this time due to [place] preconditions. Pt referred out at direction of Hampton Abbot, MD, Medical Director.  Patient meets inpatient criteria per Thomes Lolling, NP. Patient referred to the following facilities:  Destination Service Provider Address Phone Fax  [facility]  [address], [place] [address] [phone]  [facility]  [address] [place] [phone] [place]  [facility]  [address], [place] [address] [phone]  [facility]  [place], [place] [address] [phone]  [facility]  [address] [place] [address] [phone]  [facility]  [address] [place] [address] [phone]  [facility] [address], [place] [address] [id number]  [facility]  [address], [place] [address] [phone]  [facility]  [address], [place] [address] [phone] [place]  [address] [place] [address] [phone]  [facility]  [address] [place] [address] [place]  [facility]  [address] [place]  [address] [phone]    CSW will continue to monitor disposition.    Signed:  Durenda Hurt, MSW, [place], LCASA 05/10/2021 10:19 AM

## 2021-05-10 NOTE — ED Notes (Signed)
Pt only wanted orange juice for breakfast.
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
## 2021-05-10 NOTE — ED Notes (Addendum)
Report called to RN Sol Passer at [facility], pending serving of IVC paperwork and transfer to [employer]. Via [employer].
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
## 2021-05-10 NOTE — ED Notes (Signed)
Pt sleeping at present, no distress noted, monitoring for safety. 
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
## 2021-05-10 NOTE — ED Notes (Signed)
Pt refused EKG, states that he isn't having any chest pain, PA notified

## 2021-05-10 NOTE — ED Notes (Signed)
Pt requested to go home, informed provider via private chat.

## 2021-05-10 NOTE — ED Notes (Signed)
Pt is IVC, he is still upset and refusing things.
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
## 2021-05-10 NOTE — ED Notes (Signed)
I offered pt. dinner, he did not allow me to say much, he stated "what the hell do you have, oatmeal, cereal, etc. I don't want none of that shit, I just want to get out of here so leave me the hell alone and fuck you.

## 2021-05-10 NOTE — ED Notes (Signed)
Pt stated, "You took my wallet right quick" when staff informed him that the provider had been contacted but they were unable to come exactly at this moment.

## 2021-05-10 NOTE — ED Provider Notes (Signed)
Patient is IVC. When he is served, police can transport to [facility]. Dr. Alvino Chapel notified and accepted patient.

## 2021-05-10 NOTE — ED Notes (Signed)
Pt. Is getting upset, and stated he wants to go. I have offered him breakfast, he refused but took the orange juice. He complained he had not had a shower and went on to start to fussing.  I gave the pt. hygiene items etc...and he refused to take the items and stated again " I just want to get the hell out of here and its taking a long time.

## 2021-05-10 NOTE — ED Triage Notes (Addendum)
Pt brought in by police from [facility] to be IVC. Officer didn't have any information about the pt other than [place] can only take up to a certain age. Pt doesn't understand why he is here. Pt denies SI/HI. Police officers standing outside of the door.

## 2021-05-10 NOTE — ED Notes (Signed)
[employer] dispatch called, to serve IVC and transport to [facility] ED.

## 2021-05-10 NOTE — ED Notes (Signed)
Attempted to call report, no answer and going to a voicemail that was not set up. Called the nurse desk and was able to speak to someone. Was informed that the CN would return my call.

## 2021-05-10 NOTE — Discharge Instructions (Addendum)
Patient will be transported to [facility]. Dr. Alvino Chapel accepting physician. Patient is recommenced for inpatient psychiatric admission. No available beds at [facility], patient has been faxed out.

## 2021-05-10 NOTE — ED Notes (Signed)
EDP at bedside  

## 2021-05-10 NOTE — Progress Notes (Signed)
Devin Ellison continues to sit in the OBS area and remains very upset related to being held here [place]. He was offered nourishments several times throughout the day and angrily refused. He has been watching TV at intervals.

## 2021-05-10 NOTE — ED Notes (Signed)
Pt accepted morning PO meds well. Pt yelling and cussing at staff members. Stating that he is ready to leave. Getting on the phone and stating, "Fucking, damn it" as he puts the phone receiver up.
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
## 2021-05-10 NOTE — ED Notes (Signed)
Pt at nurses desk speaking in a loud voice stating that he wants to go home. Stated that he has not had a shower, decent meal, only offered cereal and "All I want is the door". Staff offered linen and toiletries for a shower, pt refused. Informed provider via private chat that he wants to speak to someone and is requesting to leave.

## 2021-05-10 NOTE — ED Notes (Signed)
Daughter Joellen Jersey [phone] would like an update

## 2021-05-11 ENCOUNTER — Encounter (HOSPITAL_COMMUNITY): Payer: Self-pay | Admitting: Registered Nurse

## 2021-05-11 DIAGNOSIS — F3112 Bipolar disorder, current episode manic without psychotic features, moderate: Secondary | ICD-10-CM

## 2021-05-11 DIAGNOSIS — F3110 Bipolar disorder, current episode manic without psychotic features, unspecified: Secondary | ICD-10-CM | POA: Diagnosis present

## 2021-05-11 DIAGNOSIS — R45851 Suicidal ideations: Secondary | ICD-10-CM

## 2021-05-11 DIAGNOSIS — F3111 Bipolar disorder, current episode manic without psychotic features, mild: Secondary | ICD-10-CM | POA: Diagnosis not present

## 2021-05-11 LAB — CBG MONITORING, ED: Glucose-Capillary: 279 mg/dL — ABNORMAL HIGH (ref 70–99)
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
## 2021-05-11 NOTE — Progress Notes (Signed)
Patient has been recommended for psych placement and has been faxed out. Patient meets inpatient criteria per Hazeline Junker. Patient referred to the following facilities:  [facility]  [address] [place] [address] [phone]  [facility], [place] [phone] [facility]  [place], [place] [address] [phone]  [facility]  [address], [place] [address] [phone]  [facility]  [address] [place], [place] [address] [phone] [facility]  [address] [place] [address] [phone] [facility]  [address], [place] [address] [phone]  [facility]  [address], [place] [address] [phone] [place]  [address] [place] [address] [phone] [place], [place] [address] [phone]  [facility]  [address] [place] [address] [phone]  [facility]  [address], [place] [address] [place]  [address], [place] [address] [phone] [facility]  [place], [place] [address] [phone]  [facility]  [address] [place] [address] [phone]  [facility]  [address], [place] [address] [phone]    CSW will continue to monitor disposition.    Mariea Clonts, MSW, LCSW-A  1:38 PM  05/11/2021

## 2021-05-11 NOTE — ED Notes (Signed)
Tech attempted to get vital signs, patient begain to curse at tech. Tech left room, vital signs deferred at this time

## 2021-05-11 NOTE — ED Notes (Signed)
Patient much more civil with tech, provided with snack and helped find a tv program that interested him.

## 2021-05-11 NOTE — ED Notes (Signed)
Daughter called again to check on update, nothing has changed. Requests notification when placement is determined.

## 2021-05-11 NOTE — ED Notes (Signed)
Pt daughter, Joellen [place] updated. RN to update if pt receives placement.

## 2021-05-12 DIAGNOSIS — F3111 Bipolar disorder, current episode manic without psychotic features, mild: Secondary | ICD-10-CM | POA: Diagnosis not present

## 2021-05-12 LAB — CBG MONITORING, ED: Glucose-Capillary: 213 mg/dL — ABNORMAL HIGH (ref 70–99)

## 2021-05-12 NOTE — ED Notes (Signed)
Shower/ ADLs complete. Irritable, updated.

## 2021-05-12 NOTE — ED Provider Notes (Signed)
12:12 PM Per behavioral health this patient has been cleared for discharge with outpatient follow-up.   Carmin Muskrat, MD 05/12/21 1213

## 2021-05-12 NOTE — ED Notes (Signed)
[facility] called to assess appropriateness of placement

## 2021-05-12 NOTE — ED Notes (Signed)
Up to b/r, steady gait.  TTS in progress, calm, cooperative, participatory.

## 2021-05-12 NOTE — BH Assessment (Signed)
[facility] Assessment Progress Note   Per Shuvon Rankin, NP, this pt does not require psychiatric hospitalization at this time.  Pt is psychiatrically cleared.  Discharge instructions include referral information for the Partial Hospitalization Program offered by the [facility] [place].  Pt's nurse, Roderic Palau, has been notified.  Jalene Mullet, [place] Triage Specialist [phone]

## 2021-05-12 NOTE — Discharge Instructions (Signed)
For your behavioral health needs, you are advised to follow up with the Partial Hospitalization Program (PHP) at the [facility] [place].  This program meets Monday - Friday from 9:00 am - 1:00 pm.  Due to Covid-19 this program is currently virtual, but will soon be returning to in-person.  For any questions or to schedule an intake appointment, contact Lorin Glass, LCSW at the phone number indicated below:       [facility]. [employer]. [place], [place] [address]      Contact person: Lorin Glass, LCSW      [phone]

## 2021-05-12 NOTE — ED Notes (Addendum)
VSS, pt eating lunch, IVC rescinded, collecting belongings/ valuables. Preparing for d/c. Offered to pt phone to call family/ resources/ ride.Marland KitchenMarland KitchenDeclined at this time.

## 2021-05-12 NOTE — ED Notes (Addendum)
Pt speaking with family, family updated on d/c plan, EDP notified, Partial hospitalization and close f/u discussed. pt d/c'd

## 2021-05-12 NOTE — ED Notes (Signed)
Pt resting at this time. Will obtain vitals when pt is awake

## 2021-05-12 NOTE — ED Notes (Signed)
Breakfast given/ accepted

## 2021-05-12 NOTE — ED Notes (Signed)
Calm, resting, watching TV, waiting on shower

## 2021-05-12 NOTE — Consult Note (Signed)
Telepsych Consultation   Reason for Consult:  Delusions Referring Physician:  Lindon Romp, PA Location of Patient: [facility] ED Location of Provider: Other: [facility]  Patient Identification: Devin Ellison MRN:  [id number] Principal Diagnosis: Bipolar affective disorder, current episode manic ([place]) Diagnosis:  Principal Problem:   Bipolar affective disorder, current episode manic ([place]) Active Problems:   MDD (major depressive disorder), single episode   Suicidal ideation   Total Time spent with patient: 30 minutes  Subjective:   Devin Ellison is a 65 y.o. male patient with history of bipolar affect disorder admitted to [facility] ED after being sent from [facility] with complaints that patient has been irritable, angry, not sleeping, spending money impulsively, increased anxiety and talking a lot.  Psychiatric Reassessment 05/12/21 Devin Ellison, 65 y.o., male patient seen via tele health by this provider, consulted with Dr. Hampton Abbot; and chart reviewed on 05/12/21.  On evaluation Devin Ellison states "I'm fine.  I'm not a danger to myself or anyone else and I'm ready to go home."  Patient states that he slept better last night; he is eating "just fine."  Reports he has been taking his medications without adverse reaction.  Patient denies any problems at this time.  Patient also denies suicidal/self-harm/homicidal ideation "Never had any thoughts like that in my life."  Patient also denies psychosis and paranoia.  Patient states outpatient psychiatric services with [facility].  Patient irritated when asked about therapy appointment he missed yesterday.  Asked if would like assistance reschedule appointment with Georgana Curio for therapy and patient states "No I will do it myself.  I don't want it to interfere with my work after all this I've missed enough time off."  Patient stating he is ready to go home.   During evaluation Devin Ellison is sitting on side of bed in no acute distress.  He is  alert/oriented x 3 and calm/cooperative.  His moos is irritated but euthymic and congruent with affect.  He is speaking in a clear tone at moderate volume, and normal pace; with good eye contact.  His thought process is coherent and relevant; There is no indication that he is currently responding to internal/external stimuli or experiencing delusional thought content; and he has denies suicidal/self-harm/homicidal ideation, psychosis, and paranoia.  Patient has remained calm throughout assessment and has answered questions appropriately.    Collateral Information:  Spoke to patient's nurse Onalee Hua, RN who informs that patient hasn't appeared to be responding to auditory/visual hallucination or delusional thinking.  Patient has been doing well this morning, took medications, up to shower.  Stats that patient is a little frustrated with his predicament bur understands the process.    Past Psychiatric History: Bipolar affect disorder  Risk to Self:  Denies Risk to Others:  Denies Prior Inpatient Therapy:  Yes Prior Outpatient Therapy:  Yes  Past Medical History:  Past Medical History:  Diagnosis Date   Bipolar 1 disorder ([place])    in remission   Depression    Hypertension     Past Surgical History:  Procedure Laterality Date   ANKLE SURGERY Right    COLON SURGERY     polypectomy   HAND SURGERY Right    after fx   Family History:  Family History  Problem Relation Age of Onset   Heart disease Mother    Cerebral palsy Sister 81   Leukemia Sister    Breast cancer Sister    Family Psychiatric  History: None  noted Social History:  Social History   Substance and Sexual Activity  Alcohol Use Yes   Alcohol/week: 0.0 standard drinks   Comment: 6 beers/week     Social History   Substance and Sexual Activity  Drug Use No    Social History   Socioeconomic History   Marital status: Married    Spouse name: Not on file   Number of children: Not on file   Years of education:  Not on file   Highest education level: Not on file  Occupational History   Not on file  Tobacco Use   Smoking status: Former    Packs/day: 1.00    Types: Cigarettes    Quit date: 08/13/2013    Years since quitting: 7.7   Smokeless tobacco: Never  Substance and Sexual Activity   Alcohol use: Yes    Alcohol/week: 0.0 standard drinks    Comment: 6 beers/week   Drug use: No   Sexual activity: Not on file  Other Topics Concern   Not on file  Social History Narrative   Not on file   Social Determinants of Health   Financial Resource Strain: Not on file  Food Insecurity: Not on file  Transportation Needs: Not on file  Physical Activity: Not on file  Stress: Not on file  Social Connections: Not on file   Additional Social History:    Allergies:  No Known Allergies  Labs:  Results for orders placed or performed during the hospital encounter of 05/10/21 (from the past 48 hour(s))  Resp Panel by RT-PCR (Flu A&B, Covid) Nasopharyngeal Swab     Status: None   Collection Time: 05/10/21 11:02 PM   Specimen: Nasopharyngeal Swab; Nasopharyngeal(NP) swabs in vial transport medium  Result Value Ref Range   SARS Coronavirus 2 by RT PCR NEGATIVE NEGATIVE    Comment: (NOTE) SARS-CoV-2 target nucleic acids are NOT DETECTED.  The SARS-CoV-2 RNA is generally detectable in upper respiratory specimens during the acute phase of infection. The lowest concentration of SARS-CoV-2 viral copies this assay can detect is 138 copies/mL. A negative result does not preclude SARS-Cov-2 infection and should not be used as the sole basis for treatment or other patient management decisions. A negative result may occur with  improper specimen collection/handling, submission of specimen other than nasopharyngeal swab, presence of viral mutation(s) within the areas targeted by this assay, and inadequate number of viral copies(<138 copies/mL). A negative result must be combined with clinical observations,  patient history, and epidemiological information. The expected result is Negative.  Fact Sheet for Patients:  EntrepreneurPulse.com.au  Fact Sheet for Healthcare Providers:  IncredibleEmployment.be  This test is no t yet approved or cleared by the [place] FDA and  has been authorized for detection and/or diagnosis of SARS-CoV-2 by FDA under an Emergency Use Authorization (EUA). This EUA will remain  in effect (meaning this test can be used) for the duration of the COVID-19 declaration under Section 564(b)(1) of the Act, 21 U.S.C.section 360bbb-3(b)(1), unless the authorization is terminated  or revoked sooner.       Influenza A by PCR NEGATIVE NEGATIVE   Influenza B by PCR NEGATIVE NEGATIVE    Comment: (NOTE) The Xpert Xpress SARS-CoV-2/FLU/RSV plus assay is intended as an aid in the diagnosis of influenza from Nasopharyngeal swab specimens and should not be used as a sole basis for treatment. Nasal washings and aspirates are unacceptable for Xpert Xpress SARS-CoV-2/FLU/RSV testing.  Fact Sheet for Patients: EntrepreneurPulse.com.au  Fact Sheet for Healthcare Providers:  IncredibleEmployment.be  This test is not yet approved or cleared by the [place] and has been authorized for detection and/or diagnosis of SARS-CoV-2 by FDA under an Emergency Use Authorization (EUA). This EUA will remain in effect (meaning this test can be used) for the duration of the COVID-19 declaration under Section 564(b)(1) of the Act, 21 U.S.C. section 360bbb-3(b)(1), unless the authorization is terminated or revoked.  Performed at [facility], [place] [address] [place], [place] [address]   CBG monitoring, ED     Status: Abnormal   Collection Time: 05/11/21  9:10 AM  Result Value Ref Range   Glucose-Capillary 279 (H) 70 - 99 mg/dL    Comment: Glucose reference range applies only to samples taken after  fasting for at least 8 hours.  CBG monitoring, ED     Status: Abnormal   Collection Time: 05/12/21  8:12 AM  Result Value Ref Range   Glucose-Capillary 213 (H) 70 - 99 mg/dL    Comment: Glucose reference range applies only to samples taken after fasting for at least 8 hours.    Medications:  Current Facility-Administered Medications  Medication Dose Route Frequency Provider Last Rate Last Admin   amLODipine (NORVASC) tablet 5 mg  5 mg Oral Daily Theodis Blaze E, PA-C   5 mg at 05/12/21 0910   atorvastatin (LIPITOR) tablet 10 mg  10 mg Oral Daily Mickie Hillier, PA-C   10 mg at 05/12/21 0910   lamoTRIgine (LAMICTAL) tablet 200 mg  200 mg Oral BID Mickie Hillier, PA-C   200 mg at 05/12/21 0911   losartan (COZAAR) tablet 100 mg  100 mg Oral Daily Mickie Hillier, PA-C   100 mg at 05/12/21 [id number]   metFORMIN (GLUCOPHAGE) tablet 1,000 mg  1,000 mg Oral Q breakfast Mickie Hillier, PA-C   1,000 mg at 05/12/21 0820   QUEtiapine (SEROQUEL XR) 24 hr tablet 200 mg  200 mg Oral QHS Mickie Hillier, PA-C   200 mg at 05/11/21 2155   Current Outpatient Medications  Medication Sig Dispense Refill   amLODipine (NORVASC) 5 MG tablet Take 5 mg by mouth daily.     atorvastatin (LIPITOR) 10 MG tablet Take 10 mg by mouth daily.     lamoTRIgine (LAMICTAL) 200 MG tablet Take 1 tablet (200 mg total) by mouth 2 (two) times daily.     losartan (COZAAR) 100 MG tablet TAKE 1 TABLET BY MOUTH EVERY DAY (Patient taking differently: Take 100 mg by mouth daily.) 30 tablet 0   metFORMIN (GLUCOPHAGE) 1000 MG tablet Take 1,000 mg by mouth daily.     QUEtiapine (SEROQUEL XR) 200 MG 24 hr tablet Take 1 tablet (200 mg total) by mouth at bedtime.      Musculoskeletal: Strength & Muscle Tone: within normal limits Gait & Station: normal Patient leans: N/A    Psychiatric Specialty Exam:  Presentation  General Appearance: Appropriate for Environment  Eye Contact:None (looking at ceiling)  Speech:Clear and Coherent;  Normal Rate  Speech Volume:Normal  Handedness:Right   Mood and Affect  Mood:Irritable; Depressed  Affect:Congruent   Thought Process  Thought Processes:Coherent  Descriptions of Associations:Intact  Orientation:Full (Time, Place and Person)  Thought Content:Delusions  History of Schizophrenia/Schizoaffective disorder:No  Duration of Psychotic Symptoms:Less than six months  Hallucinations:Hallucinations: None  Ideas of Reference:Delusions  Suicidal Thoughts:Suicidal Thoughts: No (Denies)  Homicidal Thoughts:Homicidal Thoughts: No (Denies)   Sensorium  Memory:Immediate Good; Recent Good  Judgment:Poor  Insight:Fair   Community education officer  Concentration:Fair  Attention Span:Good  Recall:Good  Fund of Knowledge:Good  Language:Good   Psychomotor Activity  Psychomotor Activity:Psychomotor Activity: Normal   Assets  Assets:Desire for Improvement; Financial Resources/Insurance; Housing; Physical Health; Social Support   Sleep  Sleep:Sleep: Fair    Physical Exam: Physical Exam Vitals and nursing note reviewed. Exam conducted with a chaperone present.  Constitutional:      General: He is not in acute distress.    Appearance: Normal appearance. He is not ill-appearing.  Cardiovascular:     Rate and Rhythm: Normal rate.  Pulmonary:     Effort: Pulmonary effort is normal.  Neurological:     Mental Status: He is alert and oriented to person, place, and time.  Psychiatric:        Attention and Perception: Attention and perception normal. He does not perceive auditory or visual hallucinations.        Mood and Affect: Mood and affect normal.        Speech: Speech normal.        Behavior: Behavior is agitated.        Thought Content: Thought content is not paranoid or delusional. Thought content does not include homicidal or suicidal ideation.        Cognition and Memory: Cognition and memory normal.        Judgment: Judgment normal.   Review of  Systems  Constitutional: Negative.   HENT: Negative.    Eyes: Negative.   Respiratory: Negative.    Cardiovascular: Negative.   Gastrointestinal: Negative.   Genitourinary: Negative.   Musculoskeletal: Negative.   Skin: Negative.   Neurological: Negative.   Endo/Heme/Allergies: Negative.   Psychiatric/Behavioral:  Depression: Stable. Hallucinations: Denies. Substance abuse: Denies. Suicidal ideas: Denies. Nervous/anxious: Denies. Insomnia: Reports he slept well last night.        Patient stating he is ready to go home.  That he has missed enough time out of work.  States he is fine "I'm nat a danger to myself or anyone else."   Blood pressure (!) 127/97, pulse 71, temperature 98.1 F (36.7 C), temperature source Oral, resp. rate 18, height 6' (1.829 m), weight 108 kg, SpO2 97 %. Body mass index is 32.29 kg/m.  Treatment Plan Summary: Daily contact with patient to assess and evaluate symptoms and progress in treatment, Medication management, and Plan Inpatient psychiatric treatment  Disposition: Recommend psychiatric Inpatient admission when medically cleared.  This service was provided via telemedicine using a 2-way, interactive audio and video technology.  Names of all persons participating in this telemedicine service and their role in this encounter. Name: Earleen Newport Role: NP  Name: Dr. Hampton Abbot Role: Psychiatrist  Name: Devin Ellison Role: Patient  Name:  Role:    Secure Message sent to patient's nurse Onalee Hua, RN informing:  Psychiatric reassessment consult complete.  Patient psychiatrically cleared.  Resources for PHP Follow up will be added to AVS by behavioral health coordinator.  Inform MD only default listed.Delphia Grates Sherilynn Dieu, NP 05/12/2021 10:58 AM

## 2021-05-14 ENCOUNTER — Emergency Department (HOSPITAL_COMMUNITY)
Admission: EM | Admit: 2021-05-14 | Discharge: 2021-05-14 | Disposition: A | Discharge status: Home / Self Care | Payer: Medicare Other | Attending: Emergency Medicine | Admitting: Emergency Medicine

## 2021-05-14 ENCOUNTER — Other Ambulatory Visit: Payer: Self-pay

## 2021-05-14 ENCOUNTER — Telehealth (HOSPITAL_COMMUNITY): Payer: Self-pay | Admitting: Licensed Clinical Social Worker

## 2021-05-14 ENCOUNTER — Encounter (HOSPITAL_COMMUNITY): Payer: Self-pay | Admitting: Emergency Medicine

## 2021-05-14 DIAGNOSIS — I10 Essential (primary) hypertension: Secondary | ICD-10-CM | POA: Insufficient documentation

## 2021-05-14 DIAGNOSIS — Z7722 Contact with and (suspected) exposure to environmental tobacco smoke (acute) (chronic): Secondary | ICD-10-CM | POA: Diagnosis not present

## 2021-05-14 DIAGNOSIS — Z87891 Personal history of nicotine dependence: Secondary | ICD-10-CM | POA: Insufficient documentation

## 2021-05-14 DIAGNOSIS — Z1330 Encounter for screening examination for mental health and behavioral disorders, unspecified: Secondary | ICD-10-CM | POA: Diagnosis present

## 2021-05-14 DIAGNOSIS — F3170 Bipolar disorder, currently in remission, most recent episode unspecified: Secondary | ICD-10-CM | POA: Insufficient documentation

## 2021-05-14 DIAGNOSIS — Z79899 Other long term (current) drug therapy: Secondary | ICD-10-CM | POA: Diagnosis not present

## 2021-05-14 DIAGNOSIS — F319 Bipolar disorder, unspecified: Secondary | ICD-10-CM
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
## 2021-05-14 NOTE — Discharge Instructions (Addendum)
Follow-up with your therapist and/or psychiatrist next week

## 2021-05-14 NOTE — ED Provider Notes (Addendum)
[place] DEPT Provider Note   CSN: [id number] Arrival date & time: 05/14/21  1741     History No chief complaint on file.   Devin Ellison is a 65 y.o. male.  65 year old male with history bipolar disorder who presents for mental health check.  Patient states that he has been compliant with his medications.  It has been loose [place] about a week ago which were described as visual.  Went to the behavioral health urgent care center where he was then sent to [facility] to hold for admission.  While there he had his medications adjusted and was discharged from there 2 days ago.  Since that time, he has not had any hallucinations.  He has no SI or HI.  States he has been sleeping appropriately.  Is here at the urging of his family      Past Medical History:  Diagnosis Date   Bipolar 1 disorder ([place])    in remission   Depression    Hypertension     Patient Active Problem List   Diagnosis Date Noted   Bipolar affective disorder, current episode manic ([place]) 05/11/2021   Dermatitis of left ear canal 10/14/2018   Essential hypertension 03/13/2017   Benign colon polyp 04/11/2016   MDD (major depressive disorder), single episode 04/30/2013   Suicidal ideation 04/30/2013    Past Surgical History:  Procedure Laterality Date   ANKLE SURGERY Right    COLON SURGERY     polypectomy   HAND SURGERY Right    after fx       Family History  Problem Relation Age of Onset   Heart disease Mother    Cerebral palsy Sister 56   Leukemia Sister    Breast cancer Sister     Social History   Tobacco Use   Smoking status: Former    Packs/day: 1.00    Types: Cigarettes    Quit date: 08/13/2013    Years since quitting: 7.7   Smokeless tobacco: Never  Substance Use Topics   Alcohol use: Yes    Alcohol/week: 0.0 standard drinks    Comment: 6 beers/week   Drug use: No    Home Medications Prior to Admission medications   Medication Sig  Start Date End Date Taking? Authorizing Provider  amLODipine (NORVASC) 5 MG tablet Take 5 mg by mouth daily. 03/06/21   [provider]  atorvastatin (LIPITOR) 10 MG tablet Take 10 mg by mouth daily. 03/24/21   [provider]  lamoTRIgine (LAMICTAL) 200 MG tablet Take 1 tablet (200 mg total) by mouth 2 (two) times daily. 05/10/21   Revonda Humphrey, NP  losartan (COZAAR) 100 MG tablet TAKE 1 TABLET BY MOUTH EVERY DAY Patient taking differently: Take 100 mg by mouth daily. 06/12/20   Daleen Squibb, MD  metFORMIN (GLUCOPHAGE) 1000 MG tablet Take 1,000 mg by mouth daily. 03/24/21   [provider]  QUEtiapine (SEROQUEL XR) 200 MG 24 hr tablet Take 1 tablet (200 mg total) by mouth at bedtime. 05/10/21   Revonda Humphrey, NP    Allergies    Patient has no known allergies.  Review of Systems   Review of Systems  All other systems reviewed and are negative.  Physical Exam Updated Vital Signs BP (!) 143/92 (BP Location: Left Arm)   Pulse 96   Temp (!) 100.5 F (38.1 C) (Oral)   Resp 18   SpO2 99%   Physical Exam Vitals and nursing  note reviewed.  Constitutional:      General: He is not in acute distress.    Appearance: Normal appearance. He is well-developed. He is not toxic-appearing.  HENT:     Head: Normocephalic and atraumatic.  Eyes:     General: Lids are normal.     Conjunctiva/sclera: Conjunctivae normal.     Pupils: Pupils are equal, round, and reactive to light.  Neck:     Thyroid: No thyroid mass.     Trachea: No tracheal deviation.  Cardiovascular:     Rate and Rhythm: Normal rate and regular rhythm.     Heart sounds: Normal heart sounds. No murmur heard.   No gallop.  Pulmonary:     Effort: Pulmonary effort is normal. No respiratory distress.     Breath sounds: Normal breath sounds. No stridor. No decreased breath sounds, wheezing, rhonchi or rales.  Abdominal:     General: There is no distension.     Palpations: Abdomen is soft.      Tenderness: There is no abdominal tenderness. There is no rebound.  Musculoskeletal:        General: No tenderness. Normal range of motion.     Cervical back: Normal range of motion and neck supple.  Skin:    General: Skin is warm and dry.     Findings: No abrasion or rash.  Neurological:     Mental Status: He is alert and oriented to person, place, and time. Mental status is at baseline.     GCS: GCS eye subscore is 4. GCS verbal subscore is 5. GCS motor subscore is 6.     Cranial Nerves: Cranial nerves are intact. No cranial nerve deficit.     Sensory: No sensory deficit.     Motor: Motor function is intact.  Psychiatric:        Attention and Perception: Attention normal.        Speech: Speech normal.        Behavior: Behavior normal.    ED Results / Procedures / Treatments   Labs (all labs ordered are listed, but only abnormal results are displayed) Labs Reviewed - No data to display  EKG None  Radiology No results found.  Procedures Procedures   Medications Ordered in ED Medications - No data to display  ED Course  I have reviewed the triage vital signs and the nursing notes.  Pertinent labs & imaging results that were available during my care of the patient were reviewed by me and considered in my medical decision making (see chart for details).    MDM Rules/Calculators/A&P                           Patient has no apparent acute psychiatric emergency at this time.  Spoke with patient's wife for collateral information.  Patient has not had any SI or HI at home.  He has been slightly agitated at times but has not been hallucinating.  She is very frustrated that he cannot be admitted to a behavioral health hospital.  He currently is not hallucinating and has no SI or HI.  He has good insight.  States that he will follow-up with his psychiatrist on Monday via virtual visit and he will see his psychologist.  He states he will take his medications.  He wishes to go home  tonight and watch the football game as well as watch tennis.  Does have a low-grade temp here but denies any  URI or urinary symptoms.  Had recent negative COVID test.  Will discharge home Final diagnoses:  None    Rx / DC Orders ED Discharge Orders     None        Lacretia Leigh, MD 05/14/21 [place]    Lacretia Leigh, MD 05/14/21 [phone]

## 2021-05-16 DIAGNOSIS — E119 Type 2 diabetes mellitus without complications: Secondary | ICD-10-CM

## 2021-05-16 DIAGNOSIS — N189 Chronic kidney disease, unspecified: Secondary | ICD-10-CM | POA: Insufficient documentation

## 2021-05-16 HISTORY — DX: Type 2 diabetes mellitus without complications: E11.9

## 2021-05-16 HISTORY — DX: Chronic kidney disease, unspecified: N18.9

## 2021-05-17 ENCOUNTER — Other Ambulatory Visit (HOSPITAL_COMMUNITY): Payer: Medicare Other

## 2021-05-17 ENCOUNTER — Other Ambulatory Visit: Payer: Self-pay

## 2021-05-20 DIAGNOSIS — Z720 Tobacco use: Secondary | ICD-10-CM | POA: Insufficient documentation

## 2021-05-20 DIAGNOSIS — K219 Gastro-esophageal reflux disease without esophagitis: Secondary | ICD-10-CM

## 2021-05-20 DIAGNOSIS — E785 Hyperlipidemia, unspecified: Secondary | ICD-10-CM

## 2021-05-20 HISTORY — DX: Hyperlipidemia, unspecified: E78.5

## 2021-05-20 HISTORY — DX: Gastro-esophageal reflux disease without esophagitis: K21.9

## 2021-05-31 ENCOUNTER — Other Ambulatory Visit: Payer: Self-pay

## 2021-05-31 ENCOUNTER — Other Ambulatory Visit (HOSPITAL_COMMUNITY): Payer: Medicare Other | Attending: Psychiatry | Admitting: Licensed Clinical Social Worker

## 2021-05-31 DIAGNOSIS — F3110 Bipolar disorder, current episode manic without psychotic features, unspecified: Secondary | ICD-10-CM

## 2021-06-07 ENCOUNTER — Encounter (HOSPITAL_COMMUNITY): Payer: Self-pay

## 2021-06-07 ENCOUNTER — Other Ambulatory Visit (HOSPITAL_COMMUNITY): Payer: Medicare Other | Admitting: Licensed Clinical Social Worker

## 2021-06-07 ENCOUNTER — Other Ambulatory Visit: Payer: Self-pay

## 2021-06-07 ENCOUNTER — Other Ambulatory Visit (HOSPITAL_COMMUNITY): Payer: Medicare Other | Attending: Psychiatry | Admitting: Occupational Therapy

## 2021-06-07 DIAGNOSIS — R41844 Frontal lobe and executive function deficit: Secondary | ICD-10-CM | POA: Insufficient documentation

## 2021-06-07 DIAGNOSIS — R4589 Other symptoms and signs involving emotional state: Secondary | ICD-10-CM | POA: Diagnosis not present

## 2021-06-07 DIAGNOSIS — F410 Panic disorder [episodic paroxysmal anxiety] without agoraphobia: Secondary | ICD-10-CM | POA: Insufficient documentation

## 2021-06-07 DIAGNOSIS — F322 Major depressive disorder, single episode, severe without psychotic features: Secondary | ICD-10-CM | POA: Diagnosis not present

## 2021-06-07 DIAGNOSIS — Z87891 Personal history of nicotine dependence: Secondary | ICD-10-CM | POA: Insufficient documentation

## 2021-06-07 DIAGNOSIS — F3110 Bipolar disorder, current episode manic without psychotic features, unspecified: Secondary | ICD-10-CM | POA: Diagnosis present

## 2021-06-07 DIAGNOSIS — Z79899 Other long term (current) drug therapy: Secondary | ICD-10-CM | POA: Diagnosis not present

## 2021-06-07 DIAGNOSIS — Z7984 Long term (current) use of oral hypoglycemic drugs: Secondary | ICD-10-CM | POA: Insufficient documentation
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
## 2021-06-08 ENCOUNTER — Other Ambulatory Visit (HOSPITAL_COMMUNITY): Payer: Medicare Other

## 2021-06-08 ENCOUNTER — Other Ambulatory Visit: Payer: Self-pay

## 2021-06-09 ENCOUNTER — Other Ambulatory Visit (HOSPITAL_COMMUNITY): Payer: Medicare Other

## 2021-06-09 ENCOUNTER — Telehealth (HOSPITAL_COMMUNITY): Payer: Self-pay | Admitting: Professional

## 2021-06-09 ENCOUNTER — Other Ambulatory Visit: Payer: Self-pay

## 2021-06-10 ENCOUNTER — Other Ambulatory Visit (HOSPITAL_COMMUNITY): Payer: Medicare Other | Admitting: Occupational Therapy

## 2021-06-10 ENCOUNTER — Other Ambulatory Visit: Payer: Self-pay

## 2021-06-10 ENCOUNTER — Encounter (HOSPITAL_COMMUNITY): Payer: Self-pay

## 2021-06-10 ENCOUNTER — Other Ambulatory Visit (HOSPITAL_COMMUNITY): Payer: Medicare Other | Admitting: Licensed Clinical Social Worker

## 2021-06-10 DIAGNOSIS — R41844 Frontal lobe and executive function deficit: Secondary | ICD-10-CM

## 2021-06-10 DIAGNOSIS — F3110 Bipolar disorder, current episode manic without psychotic features, unspecified: Secondary | ICD-10-CM

## 2021-06-10 DIAGNOSIS — F322 Major depressive disorder, single episode, severe without psychotic features: Secondary | ICD-10-CM | POA: Diagnosis not present

## 2021-06-10 DIAGNOSIS — R4589 Other symptoms and signs involving emotional state: Secondary | ICD-10-CM

## 2021-06-10 NOTE — Therapy (Signed)
[place], [place], [address] Phone: [phone]   Fax:  [phone] Virtual Visit via Video Note  I connected with [name] on 06/10/21 at  11:00 AM EDT by a video enabled telemedicine application and verified that I am speaking with the correct person using two identifiers.  Location: Patient: Patient Home Provider: Home Office   I discussed the limitations of evaluation and management by telemedicine and the availability of in person appointments. The patient expressed understanding and agreed to proceed.   I discussed the assessment and treatment plan with the patient. The patient was provided an opportunity to ask questions and all were answered. The patient agreed with the plan and demonstrated an understanding of the instructions.   The patient was advised to call back or seek an in-person evaluation if the symptoms worsen or if the condition fails to improve as anticipated.  I provided 60 minutes of non-face-to-face time during this encounter.   [name], OT/L   Occupational Therapy Treatment  Patient Details  Name: Devin Ellison MRN: [id number] Date of Birth: November 07, 1955 Referring Provider (OT): Ricky Ala   Encounter Date: 06/10/2021   OT End of Session - 06/10/21 1218     Visit Number 2    Number of Visits 20    Date for OT Re-Evaluation 07/05/21    Authorization Type Medicare MCR 80/20%  [employer] with electronic [employer]  Follows MCR guidelines no auth required    OT Start Time 1055    OT Stop Time 1155    OT Time Calculation (min) 60 min    Activity Tolerance Patient tolerated treatment well    Behavior During Therapy Flat affect             Past Medical History:  Diagnosis Date   Bipolar 1 disorder ([place])    in remission   Depression    Hypertension     Past Surgical History:  Procedure Laterality Date   ANKLE SURGERY Right     COLON SURGERY     polypectomy   HAND SURGERY Right    after fx    There were no vitals filed for this visit.   Subjective Assessment - 06/10/21 1217     Currently in Pain? No/denies             OT Education - 06/10/21 1218     Education Details Educated on sleep hygiene and strategies to improve overall sleep quality    Person(s) Educated Patient    Methods Explanation;Handout    Comprehension Verbalized understanding              OT Short Term Goals - 06/10/21 1219       OT SHORT TERM GOAL #1   Title Pt will actively engage in OT group sessions throughout duration of PHP programming, in order to promote daily structure, social engagement, and opportunities to develop and utilize adaptive strategies to maximize functional performance in preparation for safe transition and integration back into school, work, and the community.    Status On-going      OT SHORT TERM GOAL #2   Title Pt will demonstrate improved ability to communicate feelings/needs/wants, without being angry/irritable/aggressive, as evidenced by, active participation in OT sessions, throughout duration of PHP programming, in order to safely transition back into the community at discharge.    Status On-going      OT SHORT TERM GOAL #3  Title Pt will identify 1-3 stress management strategies he can utilize, in order to safely manage increased psychosocial stressors identified, with min cues, in preparation for safe transition back to the community at discharge.    Status On-going           Group Session:  S: None noted  O: Today's group discussion focused on topic of Sleep Hygiene. Patients reflected on the quality of sleep they typically receive and identified areas that need improvement. Group was given background information on sleep and sleep hygiene, including common sleep disorders. Group members also received information on how to improve one's sleep and introduced a sleep diary as a tool that  can be utilized to track sleep quality over a length of time. Group session ended with patients identifying one or more strategies they could utilize or implement into their sleep routine in order to improve overall sleep quality.     A: Tayshawn was minimally engaged in his participation of discussion, sitting on camera attentively listening, however not sharing verbally. Pt denied any concerns with his sleep and did not ask any questions or offer strategies to others. Will continue to encourage active engagement and participation in future sessions.    P: Continue to attend PHP OT group sessions 5x week for 2 weeks to promote daily structure, social engagement, and opportunities to develop and utilize adaptive strategies to maximize functional performance in preparation for safe transition and integration back into school, work, and the community. Plan to address topic of sleep hygiene (continue) in next OT group session.   Plan - 06/10/21 1218     Occupational performance deficits (Please refer to evaluation for details): ADL's;IADL's;Rest and Sleep;Education;Work;Leisure;Social Participation    Body Structure / Function / Physical Skills ADL    Cognitive Skills Attention;Emotional;Energy/Drive;Learn;Memory;Perception;Problem Solve;Safety Awareness;Temperament/Personality;Thought;Understand    Psychosocial Skills Coping Strategies;Environmental  Adaptations;Habits;Interpersonal Interaction;Routines and Behaviors             Patient will benefit from skilled therapeutic intervention in order to improve the following deficits and impairments:   Body Structure / Function / Physical Skills: ADL Cognitive Skills: Attention, Emotional, Energy/Drive, Learn, Memory, Perception, Problem Solve, Safety Awareness, Temperament/Personality, Thought, Understand Psychosocial Skills: Coping Strategies, Environmental  Adaptations, Habits, Interpersonal Interaction, Routines and Behaviors   Visit  Diagnosis: Difficulty coping  Frontal lobe and executive function deficit  Bipolar affective disorder, current episode manic, current episode severity unspecified [facility])    Problem List Patient Active Problem List   Diagnosis Date Noted   Bipolar affective disorder, current episode manic ([place]) 05/11/2021   Dermatitis of left ear canal 10/14/2018   Essential hypertension 03/13/2017   Benign colon polyp 04/11/2016   MDD (major depressive disorder), single episode 04/30/2013   Suicidal ideation 04/30/2013    06/10/2021  [name], MOT, OTR/L  06/10/2021, 12:19 PM  [place], [place], [address] Phone: [phone]   Fax:  [phone]  Name: PONCE SKILLMAN MRN: [id number] Date of Birth: 1955/11/10

## 2021-06-11 ENCOUNTER — Encounter (HOSPITAL_COMMUNITY): Payer: Self-pay

## 2021-06-11 ENCOUNTER — Other Ambulatory Visit (HOSPITAL_COMMUNITY): Payer: Medicare Other | Admitting: Licensed Clinical Social Worker

## 2021-06-11 ENCOUNTER — Other Ambulatory Visit (HOSPITAL_COMMUNITY): Payer: Medicare Other | Admitting: Occupational Therapy

## 2021-06-11 ENCOUNTER — Other Ambulatory Visit: Payer: Self-pay

## 2021-06-11 DIAGNOSIS — R4589 Other symptoms and signs involving emotional state: Secondary | ICD-10-CM

## 2021-06-11 DIAGNOSIS — F4321 Adjustment disorder with depressed mood: Secondary | ICD-10-CM

## 2021-06-11 DIAGNOSIS — F3110 Bipolar disorder, current episode manic without psychotic features, unspecified: Secondary | ICD-10-CM

## 2021-06-11 DIAGNOSIS — F322 Major depressive disorder, single episode, severe without psychotic features: Secondary | ICD-10-CM | POA: Diagnosis not present

## 2021-06-11 DIAGNOSIS — R41844 Frontal lobe and executive function deficit: Secondary | ICD-10-CM

## 2021-06-11 NOTE — Progress Notes (Signed)
Virtual Visit via Video Note  I connected with Devin Ellison on 06/11/21 at  9:00 AM EDT by a video enabled telemedicine application and verified that I am speaking with the correct person using two identifiers.  Location: Patient: Home  Provider: Office   I discussed the limitations of evaluation and management by telemedicine and the availability of in person appointments. The patient expressed understanding and agreed to proceed.  I discussed the assessment and treatment plan with the patient. The patient was provided an opportunity to ask questions and all were answered. The patient agreed with the plan and demonstrated an understanding of the instructions.   The patient was advised to call back or seek an in-person evaluation if the symptoms worsen or if the condition fails to improve as anticipated.  I provided 15 minutes of non-face-to-face time during this encounter.   Derrill Center, NP    Behavioral Health Partial Program Assessment Note  Date: 06/11/2021 Name: Devin Ellison MRN: [id number]  Chief Complaint:   Subjective:   HPI: Devin Ellison is a 65 y.o. Caucasian male presents with depression and anxiety after the passing of his mother.  Reports multiple stressors led up to his most recent inpatient admission and manic behavior.  States he is currently followed by therapy and psychiatry.  Multiple medication adjustments states he feels like he is in a good place currently.  Reports he recently had a phone interview with [employer] and has plans to get a new puppy.  States he is going to name the puppy Devin Ellison after his mother.  States his mother was 53  year old when she passed away.   Devin Ellison reports 3 previous inpatient admissions.  Reports experiencing back-to-back manic episodes in [facility].  States he is prescribed Depakote 500 mg  however was recently titrated to 1200 mg.  Lamictal 100 mg p.o. twice daily and Seroquel 400 mg nightly.  He reports taking  and tolerating medications well.  Reports a history of delusional behavior with mania he reports "I feel invincible"  however states his mood has improved overall.    Denying suicidal or homicidal ideations.  Denies auditory or visual hallucinations.  Denies physical or sexual abuse.  Reported occasional marijuana use patient was enrolled in partial psychiatric program on 06/11/21.  Primary complaints include: anxiety, depression lessened, and feeling depressed.  Onset of symptoms was gradual with gradually worsening course since that time. Psychosocial Stressors include the following: family and financial.   I have reviewed the following documentation dated 06/12/2021: past psychiatric history, past medical history, past social and family history, and past Review of systems  Complaints of Pain: nonear Past Psychiatric History:  None  Currently in treatment with Depakote Seroquel and Lamictal.  Substance Abuse History: marijuana Use of Alcohol: denied Use of Caffeine: denies use Use of over the counter:   Past Surgical History:  Procedure Laterality Date   ANKLE SURGERY Right    COLON SURGERY     polypectomy   HAND SURGERY Right    after fx    Past Medical History:  Diagnosis Date   Bipolar 1 disorder ([place])    in remission   Depression    Hypertension    Outpatient Encounter Medications as of 06/11/2021  Medication Sig   amLODipine (NORVASC) 5 MG tablet Take 5 mg by mouth daily.   atorvastatin (LIPITOR) 10 MG tablet Take 10 mg by mouth daily.   lamoTRIgine (LAMICTAL) 200 MG tablet Take 1 tablet (200 mg total)  by mouth 2 (two) times daily.   losartan (COZAAR) 100 MG tablet TAKE 1 TABLET BY MOUTH EVERY DAY (Patient taking differently: Take 100 mg by mouth daily.)   metFORMIN (GLUCOPHAGE) 1000 MG tablet Take 1,000 mg by mouth daily.   QUEtiapine (SEROQUEL XR) 200 MG 24 hr tablet Take 1 tablet (200 mg total) by mouth at bedtime.   No facility-administered encounter medications  on file as of 06/11/2021.   No Known Allergies  Social History   Tobacco Use   Smoking status: Former    Packs/day: 1.00    Types: Cigarettes    Quit date: 08/13/2013    Years since quitting: 7.8   Smokeless tobacco: Never  Substance Use Topics   Alcohol use: Yes    Alcohol/week: 0.0 standard drinks    Comment: 6 beers/week   Functioning Relationships: good support system Education: Other (Specify any learning disability, behavioral disorder, special education needs, difficulty reading.):  Other Pertinent History:  Family History  Problem Relation Age of Onset   Heart disease Mother    Cerebral palsy Sister 6   Leukemia Sister    Breast cancer Sister      Review of Systems Constitutional:   Objective:  There were no vitals filed for this visit.  Physical Exam:  Mental Status Exam: Appearance:  Well groomed Psychomotor::  Within Normal Limits Attention span and concentration: Normal Behavior: calm, cooperative, and adequate rapport can be established Speech:  normal volume Mood:  depressed and anxious Affect:  normal Thought Process:  Coherent Thought Content:  Logical Orientation:  person, place, and time/date Cognition:  grossly intact Insight:  Fair Judgment:  Fair Estimate of Intelligence: Average Fund of knowledge: Intact Memory: Recent and remote intact Abnormal movements: None Gait and station: Normal  Assessment:  Diagnosis: Bipolar I disorder, most recent episode (or current) manic ([place]) [F31.10] 1. Bipolar I disorder, most recent episode (or current) manic ([employer])   2. Major depressive disorder, single episode, severe ([place])   3. Grief   4. Difficulty coping     Indications for admission: inpatient care required if not in partial hospital program  Plan: Orders placed for occupational therapy patient enrolled in Partial Hospitalization Program, patient's current medications are to be continued, a comprehensive treatment plan will be  developed, and side effects of medications have been reviewed with patient  Treatment options and alternatives reviewed with patient and patient understands the above plan.  Treatment plan was reviewed and agreed upon by NP T. Bobby Rumpf and patient Devin Ellison need for group services    Derrill Center, NP
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
## 2021-06-11 NOTE — Therapy (Signed)
Devin Ellison, [place], [address] Phone: [phone]   Fax:  [phone] Virtual Visit via Video Note  I connected with Devin Ellison on 06/11/21 at  12:10 PM EDT by a video enabled telemedicine application and verified that I am speaking with the correct person using two identifiers.  Location: Patient: Patient Home Provider: Home Office   I discussed the limitations of evaluation and management by telemedicine and the availability of in person appointments. The patient expressed understanding and agreed to proceed.   I discussed the assessment and treatment plan with the patient. The patient was provided an opportunity to ask questions and all were answered. The patient agreed with the plan and demonstrated an understanding of the instructions.   The patient was advised to call back or seek an in-person evaluation if the symptoms worsen or if the condition fails to improve as anticipated.  I provided 40 minutes of non-face-to-face time during this encounter.   Devin Ellison, OT/L   Occupational Therapy Treatment  Patient Details  Name: Devin Ellison MRN: [id number] Date of Birth: 1956-03-30 Referring Provider (OT): Ricky Ala   Encounter Date: 06/11/2021   OT End of Session - 06/11/21 1543     Visit Number 3    Number of Visits 20    Date for OT Re-Evaluation 07/05/21    Authorization Type Medicare MCR 80/20%  [employer] with electronic Crossover  Follows MCR guidelines no auth required    OT Start Time 1210    OT Stop Time 1250    OT Time Calculation (min) 40 min    Activity Tolerance Patient tolerated treatment well    Behavior During Therapy WFL for tasks assessed/performed             Past Medical History:  Diagnosis Date   Bipolar 1 disorder ([place])    in remission   Depression    Diabetes mellitus, type II ([place])    Hypertension    Hypertension     Past  Surgical History:  Procedure Laterality Date   ANKLE SURGERY Right    COLON SURGERY     polypectomy   HAND SURGERY Right    after fx    There were no vitals filed for this visit.   Subjective Assessment - 06/11/21 1543     Currently in Pain? No/denies                                  OT Education - 06/11/21 1543     Education Details Educated on fight-or-flight response and use of relaxation strategies including deep breathing, guided imagery, and PMR    Person(s) Educated Patient    Methods Explanation;Handout    Comprehension Verbalized understanding              OT Short Term Goals - 06/10/21 1219       OT SHORT TERM GOAL #1   Title Pt will actively engage in OT group sessions throughout duration of PHP programming, in order to promote daily structure, social engagement, and opportunities to develop and utilize adaptive strategies to maximize functional performance in preparation for safe transition and integration back into school, work, and the community.    Status On-going      OT SHORT TERM GOAL #2   Title Pt will demonstrate improved ability to communicate feelings/needs/wants, without being angry/irritable/aggressive,  as evidenced by, active participation in OT sessions, throughout duration of PHP programming, in order to safely transition back into the community at discharge.    Status On-going      OT SHORT TERM GOAL #3   Title Pt will identify 1-3 stress management strategies he can utilize, in order to safely manage increased psychosocial stressors identified, with min cues, in preparation for safe transition back to the community at discharge.    Status On-going           Group Session:  S: "I'm going to [place] to get a puppy this weekend"  O:  Group began with a warm-up activity and group members were encouraged to share and identify ways in which they have practiced relaxation in the past, including any specific  strategies or hobbies. Today's discussion focused on the topic of RELAXATION and patients reviewed and engaged in a variety of relaxation strategies techniques including deep breathing, guided imagery/meditation, and progressive muscle relaxation. After each strategy was reviewed, group members were invited to engage in an active practice of relaxation strategies identified. Review and background on the fight-or-flight response was also provided.   A: Devin Ellison was actively engaged in today's session, appeared open and receptive to education/information received on relaxation strategies. Pt shared that he was going to get a new puppy this weekend and also likes to relax by playing some of his guitars.   P: Continue to attend PHP OT group sessions 5x week for 2 weeks to promote daily structure, social engagement, and opportunities to develop and utilize adaptive strategies to maximize functional performance in preparation for safe transition and integration back into school, work, and the community.    Plan - 06/11/21 1543     Occupational performance deficits (Please refer to evaluation for details): ADL's;IADL's;Rest and Sleep;Education;Work;Leisure;Social Participation    Body Structure / Function / Physical Skills ADL    Cognitive Skills Attention;Emotional;Energy/Drive;Learn;Memory;Perception;Problem Solve;Safety Awareness;Temperament/Personality;Thought;Understand    Psychosocial Skills Coping Strategies;Environmental  Adaptations;Habits;Interpersonal Interaction;Routines and Behaviors             Patient will benefit from skilled therapeutic intervention in order to improve the following deficits and impairments:   Body Structure / Function / Physical Skills: ADL Cognitive Skills: Attention, Emotional, Energy/Drive, Learn, Memory, Perception, Problem Solve, Safety Awareness, Temperament/Personality, Thought, Understand Psychosocial Skills: Coping Strategies, Environmental  Adaptations,  Habits, Interpersonal Interaction, Routines and Behaviors   Visit Diagnosis: Difficulty coping  Frontal lobe and executive function deficit  Bipolar affective disorder, current episode manic, current episode severity unspecified [facility])    Problem List Patient Active Problem List   Diagnosis Date Noted   Bipolar affective disorder, current episode manic ([place]) 05/11/2021   Dermatitis of left ear canal 10/14/2018   Essential hypertension 03/13/2017   Benign colon polyp 04/11/2016   MDD (major depressive disorder), single episode 04/30/2013   Suicidal ideation 04/30/2013    06/11/2021  Devin Ellison, MOT, OTR/L  06/11/2021, 3:44 PM  [place], [place], [address] Phone: [phone]   Fax:  [phone]  Name: Devin Ellison MRN: [id number] Date of Birth: Jun 02, 1956

## 2021-06-14 ENCOUNTER — Other Ambulatory Visit (HOSPITAL_COMMUNITY): Payer: Medicare Other

## 2021-06-14 ENCOUNTER — Other Ambulatory Visit: Payer: Self-pay

## 2021-06-14 ENCOUNTER — Other Ambulatory Visit (HOSPITAL_COMMUNITY): Payer: Medicare Other | Admitting: Licensed Clinical Social Worker

## 2021-06-14 DIAGNOSIS — F322 Major depressive disorder, single episode, severe without psychotic features: Secondary | ICD-10-CM | POA: Diagnosis not present

## 2021-06-14 DIAGNOSIS — F3110 Bipolar disorder, current episode manic without psychotic features, unspecified: Secondary | ICD-10-CM

## 2021-06-15 ENCOUNTER — Other Ambulatory Visit (HOSPITAL_COMMUNITY): Payer: Medicare Other

## 2021-06-15 ENCOUNTER — Other Ambulatory Visit: Payer: Self-pay

## 2021-06-15 ENCOUNTER — Telehealth (HOSPITAL_COMMUNITY): Payer: Self-pay | Admitting: Professional

## 2021-06-16 ENCOUNTER — Other Ambulatory Visit (HOSPITAL_COMMUNITY): Payer: Medicare Other

## 2021-06-16 ENCOUNTER — Other Ambulatory Visit: Payer: Self-pay

## 2021-06-17 ENCOUNTER — Encounter (HOSPITAL_COMMUNITY): Payer: Self-pay

## 2021-06-17 ENCOUNTER — Other Ambulatory Visit (HOSPITAL_COMMUNITY): Payer: Medicare Other | Admitting: Occupational Therapy

## 2021-06-17 ENCOUNTER — Other Ambulatory Visit: Payer: Self-pay

## 2021-06-17 ENCOUNTER — Other Ambulatory Visit (HOSPITAL_COMMUNITY): Payer: Medicare Other | Admitting: Licensed Clinical Social Worker

## 2021-06-17 DIAGNOSIS — F322 Major depressive disorder, single episode, severe without psychotic features: Secondary | ICD-10-CM | POA: Diagnosis not present

## 2021-06-17 DIAGNOSIS — R41844 Frontal lobe and executive function deficit: Secondary | ICD-10-CM

## 2021-06-17 DIAGNOSIS — F3110 Bipolar disorder, current episode manic without psychotic features, unspecified: Secondary | ICD-10-CM

## 2021-06-17 DIAGNOSIS — R4589 Other symptoms and signs involving emotional state: Secondary | ICD-10-CM

## 2021-06-17 NOTE — Therapy (Signed)
[place], [place], [address] Phone: [phone]   Fax:  [phone] Virtual Visit via Video Note  I connected with Devin Ellison on 06/17/21 at  11:00 AM EDT by a video enabled telemedicine application and verified that I am speaking with the correct person using two identifiers.  Location: Patient: Patient Home Provider: Clinic Office   I discussed the limitations of evaluation and management by telemedicine and the availability of in person appointments. The patient expressed understanding and agreed to proceed.   I discussed the assessment and treatment plan with the patient. The patient was provided an opportunity to ask questions and all were answered. The patient agreed with the plan and demonstrated an understanding of the instructions.   The patient was advised to call back or seek an in-person evaluation if the symptoms worsen or if the condition fails to improve as anticipated.  I provided 60 minutes of non-face-to-face time during this encounter.   Devin Ellison, OT/L   Occupational Therapy Treatment  Patient Details  Name: Devin Ellison MRN: [id number] Date of Birth: Oct 26, 1955 Referring Provider (OT): Ricky Ala   Encounter Date: 06/17/2021   OT End of Session - 06/17/21 1316     Visit Number 4    Number of Visits 20    Date for OT Re-Evaluation 07/05/21    Authorization Type Medicare MCR 80/20%  [employer] Supp with electronic Crossover  Follows MCR guidelines no auth required    OT Start Time 1105    OT Stop Time 1205    OT Time Calculation (min) 60 min    Activity Tolerance Patient tolerated treatment well    Behavior During Therapy WFL for tasks assessed/performed             Past Medical History:  Diagnosis Date   Bipolar 1 disorder ([place])    in remission   Depression    Diabetes mellitus, type II ([place])    Hypertension    Hypertension      Past Surgical History:  Procedure Laterality Date   ANKLE SURGERY Right    COLON SURGERY     polypectomy   HAND SURGERY Right    after fx    There were no vitals filed for this visit.   Subjective Assessment - 06/17/21 1316     Currently in Pain? No/denies                                  OT Education - 06/17/21 1316     Education Details Educated on tips and strategies to improve overall self-care    Person(s) Educated Patient    Methods Explanation;Handout    Comprehension Verbalized understanding              OT Short Term Goals - 06/10/21 1219       OT SHORT TERM GOAL #1   Title Pt will actively engage in OT group sessions throughout duration of PHP programming, in order to promote daily structure, social engagement, and opportunities to develop and utilize adaptive strategies to maximize functional performance in preparation for safe transition and integration back into school, work, and the community.    Status On-going      OT SHORT TERM GOAL #2   Title Pt will demonstrate improved ability to communicate feelings/needs/wants, without being angry/irritable/aggressive, as evidenced by, active participation in OT  sessions, throughout duration of PHP programming, in order to safely transition back into the community at discharge.    Status On-going      OT SHORT TERM GOAL #3   Title Pt will identify 1-3 stress management strategies he can utilize, in order to safely manage increased psychosocial stressors identified, with min cues, in preparation for safe transition back to the community at discharge.    Status On-going           Group Session:  S: "getting enough sleep"  O: Today's group session focused on topic of self-care and group members identified their definitions of what self-care means to them, along with recognizing the difference between self-care and selfishness. Members worked through all five subcategories of  self-care including physical, emotional/psychological, social, spiritual, and professional self-care. Discussion focused on members sharing which areas they need improvement in and which areas they identified as strengths.    A: Devin Ellison was more withdrawn than noted in previous sessions, however engaged when verbally prompted to do so and shared that one of his self-care strengths at this time is getting enough sleep. He identified not walking or getting in enough exercise as something he would like to improve on. Required verbal support to engage, however was relatively attentive to discussion and strategies offered during session.   P: Continue to attend PHP OT group sessions 5x week for 2 weeks to promote daily structure, social engagement, and opportunities to develop and utilize adaptive strategies to maximize functional performance in preparation for safe transition and integration back into school, work, and the community. Plan to address topic of stress management in next OT group session.   Plan - 06/17/21 1317     Occupational performance deficits (Please refer to evaluation for details): ADL's;IADL's;Rest and Sleep;Education;Work;Leisure;Social Participation    Body Structure / Function / Physical Skills ADL    Cognitive Skills Attention;Emotional;Energy/Drive;Learn;Memory;Perception;Problem Solve;Safety Awareness;Temperament/Personality;Thought;Understand    Psychosocial Skills Coping Strategies;Environmental  Adaptations;Habits;Interpersonal Interaction;Routines and Behaviors             Patient will benefit from skilled therapeutic intervention in order to improve the following deficits and impairments:   Body Structure / Function / Physical Skills: ADL Cognitive Skills: Attention, Emotional, Energy/Drive, Learn, Memory, Perception, Problem Solve, Safety Awareness, Temperament/Personality, Thought, Understand Psychosocial Skills: Coping Strategies, Environmental  Adaptations,  Habits, Interpersonal Interaction, Routines and Behaviors   Visit Diagnosis: Difficulty coping  Frontal lobe and executive function deficit  Bipolar I disorder, most recent episode (or current) manic ([facility])    Problem List Patient Active Problem List   Diagnosis Date Noted   Bipolar affective disorder, current episode manic ([place]) 05/11/2021   Dermatitis of left ear canal 10/14/2018   Essential hypertension 03/13/2017   Benign colon polyp 04/11/2016   MDD (major depressive disorder), single episode 04/30/2013   Suicidal ideation 04/30/2013    06/17/2021  Devin Ellison, MOT, OTR/L  06/17/2021, 1:17 PM  [place], [place], [address] Phone: [phone]   Fax:  [phone]  Name: Devin Ellison MRN: [id number] Date of Birth: 27-Feb-1956

## 2021-06-18 ENCOUNTER — Other Ambulatory Visit: Payer: Self-pay

## 2021-06-18 ENCOUNTER — Other Ambulatory Visit (HOSPITAL_COMMUNITY): Payer: Medicare Other | Admitting: Occupational Therapy

## 2021-06-18 ENCOUNTER — Other Ambulatory Visit (HOSPITAL_COMMUNITY): Payer: Medicare Other | Admitting: Licensed Clinical Social Worker

## 2021-06-18 ENCOUNTER — Encounter (HOSPITAL_COMMUNITY): Payer: Self-pay

## 2021-06-18 DIAGNOSIS — R41844 Frontal lobe and executive function deficit: Secondary | ICD-10-CM

## 2021-06-18 DIAGNOSIS — F322 Major depressive disorder, single episode, severe without psychotic features: Secondary | ICD-10-CM | POA: Diagnosis not present

## 2021-06-18 DIAGNOSIS — F3110 Bipolar disorder, current episode manic without psychotic features, unspecified: Secondary | ICD-10-CM

## 2021-06-18 DIAGNOSIS — R4589 Other symptoms and signs involving emotional state: Secondary | ICD-10-CM

## 2021-06-18 NOTE — Therapy (Signed)
[place], [place], [address] Phone: [phone]   Fax:  [phone] Virtual Visit via Video Note  I connected with Devin Ellison on 06/18/21 at  11:00 AM EDT by a video enabled telemedicine application and verified that I am speaking with the correct person using two identifiers.  Location: Patient: Patient Home Provider: Home Office   I discussed the limitations of evaluation and management by telemedicine and the availability of in person appointments. The patient expressed understanding and agreed to proceed.     I discussed the assessment and treatment plan with the patient. The patient was provided an opportunity to ask questions and all were answered. The patient agreed with the plan and demonstrated an understanding of the instructions.   The patient was advised to call back or seek an in-person evaluation if the symptoms worsen or if the condition fails to improve as anticipated.  I provided 55 minutes of non-face-to-face time during this encounter.   [name], OT/L   Occupational Therapy Treatment  Patient Details  Name: Devin Ellison MRN: [id number] Date of Birth: [birth year] Referring Provider (OT): Ricky Ala   Encounter Date: 06/18/2021   OT End of Session - 06/18/21 1229     Visit Number 5    Number of Visits 20    Date for OT Re-Evaluation 07/05/21    Authorization Type Medicare MCR 80/20%  [employer] with electronic Crossover  Follows MCR guidelines no auth required    OT Start Time 1110    OT Stop Time 1205    OT Time Calculation (min) 55 min    Activity Tolerance Patient tolerated treatment well    Behavior During Therapy WFL for tasks assessed/performed             Past Medical History:  Diagnosis Date   Bipolar 1 disorder ([place])    in remission   Depression    Diabetes mellitus, type II ([place])    Hypertension    Hypertension      Past Surgical History:  Procedure Laterality Date   ANKLE SURGERY Right    COLON SURGERY     polypectomy   HAND SURGERY Right    after fx    There were no vitals filed for this visit.   Subjective Assessment - 06/18/21 1229     Currently in Pain? No/denies                                  OT Education - 06/18/21 1229     Education Details Educated on identifying worry and utilized circle on control tool to categorize what we do and do not have control over    Person(s) Educated Patient    Methods Explanation;Handout    Comprehension Verbalized understanding              OT Short Term Goals - 06/10/21 1219       OT SHORT TERM GOAL #1   Title Pt will actively engage in OT group sessions throughout duration of PHP programming, in order to promote daily structure, social engagement, and opportunities to develop and utilize adaptive strategies to maximize functional performance in preparation for safe transition and integration back into school, work, and the community.    Status On-going      OT SHORT TERM GOAL #2   Title Pt will demonstrate improved  ability to communicate feelings/needs/wants, without being angry/irritable/aggressive, as evidenced by, active participation in OT sessions, throughout duration of PHP programming, in order to safely transition back into the community at discharge.    Status On-going      OT SHORT TERM GOAL #3   Title Pt will identify 1-3 stress management strategies he can utilize, in order to safely manage increased psychosocial stressors identified, with min cues, in preparation for safe transition back to the community at discharge.    Status On-going           Group Session:  S: None noted*  O: Group session encouraged increased participation and engagement through discussion focused on worry and our circle of control. Group reviewed a PowerPoint that discussed healthy vs unhealthy worry with specific  examples provided. Discussion also focused on utilizing the circle of control outline to identify what is within our control, what we have influence on, and what is not in our control. Group members shared specific examples and worries and identified what categories they fell in within the circle of control.   A: Dwight was minimally engaged in today's session; did not respond to verbal cues to engage and appeared distracted/lots of background noise, however not visible on camera. Will continue to encourage active engagement in future session. Of note, other clinician reported pt had to leave early for interview and may have been driving (reason for not responding; will follow up).   P: Continue to attend PHP OT group sessions 5x week for 1 weeks to promote daily structure, social engagement, and opportunities to develop and utilize adaptive strategies to maximize functional performance in preparation for safe transition and integration back into school, work, and the community.   Plan - 06/18/21 1229     Occupational performance deficits (Please refer to evaluation for details): ADL's;IADL's;Rest and Sleep;Education;Work;Leisure;Social Participation    Body Structure / Function / Physical Skills ADL    Cognitive Skills Attention;Emotional;Energy/Drive;Learn;Memory;Perception;Problem Solve;Safety Awareness;Temperament/Personality;Thought;Understand    Psychosocial Skills Coping Strategies;Environmental  Adaptations;Habits;Interpersonal Interaction;Routines and Behaviors             Patient will benefit from skilled therapeutic intervention in order to improve the following deficits and impairments:   Body Structure / Function / Physical Skills: ADL Cognitive Skills: Attention, Emotional, Energy/Drive, Learn, Memory, Perception, Problem Solve, Safety Awareness, Temperament/Personality, Thought, Understand Psychosocial Skills: Coping Strategies, Environmental  Adaptations, Habits, Interpersonal  Interaction, Routines and Behaviors   Visit Diagnosis: Difficulty coping  Frontal lobe and executive function deficit  Bipolar I disorder, most recent episode (or current) manic (H[facility])    Problem List Patient Active Problem List   Diagnosis Date Noted   Bipolar affective disorder, current episode manic ([place]) 05/11/2021   Dermatitis of left ear canal 10/14/2018   Essential hypertension 03/13/2017   Benign colon polyp 04/11/2016   MDD (major depressive disorder), single episode 04/30/2013   Suicidal ideation 04/30/2013    06/18/2021  [name], MOT, OTR/L  06/18/2021, 12:30 PM  [place], [place], [address] Phone: [phone]   Fax:  [phone]  Name: BARTT GONZAGA MRN: [id number] Date of Birth: [birth year]

## 2021-06-20 ENCOUNTER — Encounter (HOSPITAL_COMMUNITY): Payer: Self-pay | Admitting: Family

## 2021-06-20 NOTE — Progress Notes (Signed)
Virtual Visit via Video Note  I connected with Devin Ellison on 06/20/21 at  9:00 AM EDT by a video enabled telemedicine application and verified that I am speaking with the correct person using two identifiers.  Location: Patient: Home Provider: Office   I discussed the limitations of evaluation and management by telemedicine and the availability of in person appointments. The patient expressed understanding and agreed to proceed.   I discussed the assessment and treatment plan with the patient. The patient was provided an opportunity to ask questions and all were answered. The patient agreed with the plan and demonstrated an understanding of the instructions.   The patient was advised to call back or seek an in-person evaluation if the symptoms worsen or if the condition fails to improve as anticipated.  I provided 15 minutes of non-face-to-face time during this encounter.   Derrill Center, NP   BH MD/PA/NP OP Progress Note  06/20/2021 4:53 PM MARKHI KLECKNER  MRN:  [id number]  Chief Complaint:  Devin Ellison reported " life is good."  HPI: Stated feeling slight anxious as he was on his way to a job interview. Continues to deny suicidal or homicidal ideations. Reported taken and tolerating medications. Reported he has started dating and has plans to she a ' young lady ' in [place] this weekend. Stated he is looking forward to that. Patient reported he has been spending time with his new puppy states" help me deal with the passing of my mother." Reported a good appetite and stated resting well thorough the night. Denied recently marijuana use. Denied any medication refills at this time. Support, encouragement and reassurance was provided.   Visit Diagnosis:    ICD-10-CM   1. Major depressive disorder, single episode, severe ([name])  F32.2     2. Bipolar I disorder, most recent episode (or current) manic ([name])  F31.10       Past Psychiatric History:   Past Medical History:   Past Medical History:  Diagnosis Date   Bipolar 1 disorder ([name])    in remission   Depression    Diabetes mellitus, type II ([name])    Hypertension    Hypertension     Past Surgical History:  Procedure Laterality Date   ANKLE SURGERY Right    COLON SURGERY     polypectomy   HAND SURGERY Right    after fx    Family Psychiatric History:   Family History:  Family History  Problem Relation Age of Onset   Heart disease Mother    Cerebral palsy Sister 67   Leukemia Sister    Breast cancer Sister    Depression Paternal Uncle     Social History:  Social History   Socioeconomic History   Marital status: Married    Spouse name: Not on file   Number of children: 2   Years of education: Not on file   Highest education level: Doctorate  Occupational History   Not on file  Tobacco Use   Smoking status: Former    Packs/day: 1.00    Types: Cigarettes    Quit date: 08/13/2013    Years since quitting: 7.8   Smokeless tobacco: Never  Substance and Sexual Activity   Alcohol use: Yes    Alcohol/week: 0.0 standard drinks    Comment: 6 beers/week   Drug use: No   Sexual activity: Not on file  Other Topics Concern   Not on file  Social History Narrative   Not on file  Social Determinants of Health   Financial Resource Strain: Not on file  Food Insecurity: Not on file  Transportation Needs: Not on file  Physical Activity: Not on file  Stress: Not on file  Social Connections: Not on file    Allergies: No Known Allergies  Metabolic Disorder Labs: Lab Results  Component Value Date   HGBA1C 7.2 (H) 05/09/2021   MPG 159.94 05/09/2021   No results found for: PROLACTIN Lab Results  Component Value Date   CHOL 143 05/09/2021   TRIG 170 (H) 05/09/2021   HDL 52 05/09/2021   CHOLHDL 2.8 05/09/2021   VLDL 34 05/09/2021   LDLCALC 57 05/09/2021   LDLCALC 96 10/07/2019   Lab Results  Component Value Date   TSH 0.630 05/09/2021   TSH 1.360 04/02/2019     Therapeutic Level Labs: Lab Results  Component Value Date   LITHIUM 1.4 (H) 08/13/2018   No results found for: VALPROATE No components found for:  CBMZ  Current Medications: Current Outpatient Medications  Medication Sig Dispense Refill   amLODipine (NORVASC) 5 MG tablet Take 5 mg by mouth daily.     atorvastatin (LIPITOR) 10 MG tablet Take 10 mg by mouth daily.     divalproex (DEPAKOTE ER) 500 MG 24 hr tablet Take 1,000 mg by mouth daily.     lamoTRIgine (LAMICTAL) 200 MG tablet Take 1 tablet (200 mg total) by mouth 2 (two) times daily.     losartan (COZAAR) 100 MG tablet TAKE 1 TABLET BY MOUTH EVERY DAY (Patient taking differently: Take 100 mg by mouth daily.) 30 tablet 0   metFORMIN (GLUCOPHAGE) 1000 MG tablet Take 1,000 mg by mouth daily.     QUEtiapine (SEROQUEL XR) 200 MG 24 hr tablet Take 1 tablet (200 mg total) by mouth at bedtime.     No current facility-administered medications for this visit.     Musculoskeletal: Strength & Muscle Tone: within normal limits Gait & Station: normal Patient leans: N/A  Psychiatric Specialty Exam: Review of Systems  Eyes: Negative.   Respiratory: Negative.    Cardiovascular: Negative.   Gastrointestinal: Negative.   Musculoskeletal: Negative.   Psychiatric/Behavioral:  Negative for behavioral problems, hallucinations and suicidal ideas. The patient is nervous/anxious.   All other systems reviewed and are negative.  There were no vitals taken for this visit.There is no height or weight on file to calculate BMI.  General Appearance: Casual  Eye Contact:  Good  Speech:  Clear and Coherent  Volume:  Normal  Mood:  Anxious and Depressed  Affect:  Congruent  Thought Process:  Coherent  Orientation:  Full (Time, Place, and Person)  Thought Content: Logical   Suicidal Thoughts:  No  Homicidal Thoughts:  No  Memory:  Immediate;   Good Remote;   Good  Judgement:  Good  Insight:  Good  Psychomotor Activity:  Normal   Concentration:  Concentration: Good  Recall:  Good  Fund of Knowledge: Good  Language: Good  Akathisia:  No  Handed:  Right  AIMS (if indicated): done  Assets:  Communication Skills Desire for Improvement Resilience Social Support  ADL's:  Intact  Cognition: WNL  Sleep:  Good   Screenings: AUDIT    Flowsheet Row Admission (Discharged) from 04/30/2013 in [facility]  Alcohol Use Disorder Identification Test Final Score (AUDIT) 1      PHQ2-9    Flowsheet Row Counselor from 06/11/2021 in [facility] Visit from 10/07/2019 in [facility] at [facility] from [facility] from 04/15/2019 in [facility] from 04/02/2019 in Primary Care at [facility] Total Score 2 4 1 1 1   PHQ-9 Total Score 8 8 -- -- --      Flowsheet Row ED from 05/14/2021 in [facility] DEPT ED from 05/10/2021 in [facility] ED from 05/09/2021 in [facility] No Risk No Risk No Risk        Assessment and Plan:  Continue Partial Hospitalization Programing Continue medications as directed  Treatment plan was reviewed and agreed upon by NP T.Bobby Rumpf and patient Kit Mollett need for group services  Derrill Center, NP 06/20/2021, 4:53 PM

## 2021-06-21 ENCOUNTER — Other Ambulatory Visit (HOSPITAL_COMMUNITY): Payer: Medicare Other | Admitting: Occupational Therapy

## 2021-06-21 ENCOUNTER — Encounter (HOSPITAL_COMMUNITY): Payer: Self-pay

## 2021-06-21 ENCOUNTER — Other Ambulatory Visit (HOSPITAL_COMMUNITY): Payer: Medicare Other | Admitting: Licensed Clinical Social Worker

## 2021-06-21 ENCOUNTER — Telehealth (HOSPITAL_COMMUNITY): Payer: Self-pay | Admitting: Psychiatry

## 2021-06-21 ENCOUNTER — Other Ambulatory Visit: Payer: Self-pay

## 2021-06-21 DIAGNOSIS — R41844 Frontal lobe and executive function deficit: Secondary | ICD-10-CM

## 2021-06-21 DIAGNOSIS — F322 Major depressive disorder, single episode, severe without psychotic features: Secondary | ICD-10-CM | POA: Diagnosis not present

## 2021-06-21 DIAGNOSIS — F3110 Bipolar disorder, current episode manic without psychotic features, unspecified: Secondary | ICD-10-CM

## 2021-06-21 DIAGNOSIS — R4589 Other symptoms and signs involving emotional state: Secondary | ICD-10-CM

## 2021-06-21 NOTE — Therapy (Signed)
[place], [place], [address] Phone: [phone]   Fax:  [phone] Virtual Visit via Video Note  I connected with Devin Ellison on 06/21/21 at  11:00 AM EDT by a video enabled telemedicine application and verified that I am speaking with the correct person using two identifiers.  Location: Patient: Patient Home Provider: Home Office   I discussed the limitations of evaluation and management by telemedicine and the availability of in person appointments. The patient expressed understanding and agreed to proceed.   I discussed the assessment and treatment plan with the patient. The patient was provided an opportunity to ask questions and all were answered. The patient agreed with the plan and demonstrated an understanding of the instructions.   The patient was advised to call back or seek an in-person evaluation if the symptoms worsen or if the condition fails to improve as anticipated.  I provided 60 minutes of non-face-to-face time during this encounter.   Devin Ellison, OT/L   Occupational Therapy Treatment  Patient Details  Name: Devin Ellison MRN: [id number] Date of Birth: March 06, 1956 Referring Provider (OT): Ricky Ala   Encounter Date: 06/21/2021   OT End of Session - 06/21/21 1246     Visit Number 6    Number of Visits 20    Date for OT Re-Evaluation 07/05/21    Authorization Type Medicare MCR 80/20%  [employer] Supp with electronic Crossover  Follows MCR guidelines no auth required    OT Start Time 1115    OT Stop Time 1215    OT Time Calculation (min) 60 min    Activity Tolerance Patient tolerated treatment well    Behavior During Therapy WFL for tasks assessed/performed             Past Medical History:  Diagnosis Date   Bipolar 1 disorder ([place])    in remission   Depression    Diabetes mellitus, type II ([place])    Hypertension    Hypertension     Past  Surgical History:  Procedure Laterality Date   ANKLE SURGERY Right    COLON SURGERY     polypectomy   HAND SURGERY Right    after fx    There were no vitals filed for this visit.   Subjective Assessment - 06/21/21 1246     Currently in Pain? No/denies                                  OT Education - 06/21/21 1246     Education Details Educated on different factors that contribute to our ability to manage our time, along with specific time management tips/strategies, including procrastination    Person(s) Educated Patient    Methods Explanation;Handout    Comprehension Verbalized understanding              OT Short Term Goals - 06/10/21 1219       OT SHORT TERM GOAL #1   Title Pt will actively engage in OT group sessions throughout duration of PHP programming, in order to promote daily structure, social engagement, and opportunities to develop and utilize adaptive strategies to maximize functional performance in preparation for safe transition and integration back into school, work, and the community.    Status On-going      OT SHORT TERM GOAL #2   Title Pt will demonstrate improved ability to  communicate feelings/needs/wants, without being angry/irritable/aggressive, as evidenced by, active participation in OT sessions, throughout duration of PHP programming, in order to safely transition back into the community at discharge.    Status On-going      OT SHORT TERM GOAL #3   Title Pt will identify 1-3 stress management strategies he can utilize, in order to safely manage increased psychosocial stressors identified, with min cues, in preparation for safe transition back to the community at discharge.    Status On-going           Group Session:  S: None noted  O: Today's group focused on topic of Time Management. Group members identified ways in which they currently struggle with managing their time and discussion focused on alternative  strategies to recognize how we can be more productive and intentional with our time and managing it appropriately. Group members also discussed specific strategies to overcome procrastination.  A: Devin Ellison was present in today's session, however was minimally engaged, despite encouragement from peers and group clinician. Pt reported that he does not struggle with procrastination or time management and was unable to offer any input/suggestions as to what has helped. Pt appeared distracted, though sat quietly on camera for session duration. Will continue to encourage active engagement in future sessions.   P: Continue to attend PHP OT group sessions 5x week for 1 weeks to promote daily structure, social engagement, and opportunities to develop and utilize adaptive strategies to maximize functional performance in preparation for safe transition and integration back into school, work, and the community. Plan to address topic of stress management in next OT group session.   Plan - 06/21/21 1246     Occupational performance deficits (Please refer to evaluation for details): ADL's;IADL's;Rest and Sleep;Education;Work;Leisure;Social Participation    Body Structure / Function / Physical Skills ADL    Cognitive Skills Attention;Emotional;Energy/Drive;Learn;Memory;Perception;Problem Solve;Safety Awareness;Temperament/Personality;Thought;Understand    Psychosocial Skills Coping Strategies;Environmental  Adaptations;Habits;Interpersonal Interaction;Routines and Behaviors             Patient will benefit from skilled therapeutic intervention in order to improve the following deficits and impairments:   Body Structure / Function / Physical Skills: ADL Cognitive Skills: Attention, Emotional, Energy/Drive, Learn, Memory, Perception, Problem Solve, Safety Awareness, Temperament/Personality, Thought, Understand Psychosocial Skills: Coping Strategies, Environmental  Adaptations, Habits, Interpersonal Interaction,  Routines and Behaviors   Visit Diagnosis: Difficulty coping  Frontal lobe and executive function deficit  Bipolar I disorder, most recent episode (or current) manic (HCC)    Problem List Patient Active Problem List   Diagnosis Date Noted   Bipolar affective disorder, current episode manic ([place]) 05/11/2021   Dermatitis of left ear canal 10/14/2018   Essential hypertension 03/13/2017   Benign colon polyp 04/11/2016   MDD (major depressive disorder), single episode 04/30/2013   Suicidal ideation 04/30/2013    06/21/2021  Devin Ellison, MOT, OTR/L  06/21/2021, 12:47 PM  [place], [place], [address] Phone: [phone]   Fax:  [phone]  Name: TIBURCIO LINDER MRN: [id number] Date of Birth: 01-17-1956

## 2021-06-21 NOTE — Telephone Encounter (Signed)
D:  Pt will be transitioning from [facility] to [facility] on 06-24-21.  A:  Placed call and oriented pt.  Encouraged pt to verify his benefits.  Pt to start [facility] on 06-24-21 @ 9 a.m.  R:  Pt receptive.

## 2021-06-22 ENCOUNTER — Other Ambulatory Visit (HOSPITAL_COMMUNITY): Payer: Medicare Other

## 2021-06-22 ENCOUNTER — Other Ambulatory Visit: Payer: Self-pay

## 2021-06-23 ENCOUNTER — Other Ambulatory Visit: Payer: Self-pay

## 2021-06-23 ENCOUNTER — Other Ambulatory Visit (HOSPITAL_COMMUNITY): Payer: Medicare Other | Admitting: Licensed Clinical Social Worker

## 2021-06-23 ENCOUNTER — Other Ambulatory Visit (HOSPITAL_COMMUNITY): Payer: Medicare Other

## 2021-06-23 DIAGNOSIS — F322 Major depressive disorder, single episode, severe without psychotic features: Secondary | ICD-10-CM | POA: Diagnosis not present

## 2021-06-23 DIAGNOSIS — F3110 Bipolar disorder, current episode manic without psychotic features, unspecified: Secondary | ICD-10-CM

## 2021-06-24 ENCOUNTER — Ambulatory Visit (HOSPITAL_COMMUNITY): Payer: Medicare Other

## 2021-06-24 ENCOUNTER — Other Ambulatory Visit (HOSPITAL_COMMUNITY): Payer: Medicare Other

## 2021-06-24 ENCOUNTER — Other Ambulatory Visit: Payer: Self-pay

## 2021-06-24 ENCOUNTER — Other Ambulatory Visit (HOSPITAL_COMMUNITY): Payer: Medicare Other | Admitting: Family

## 2021-06-24 ENCOUNTER — Encounter (HOSPITAL_COMMUNITY): Payer: Self-pay | Admitting: Psychiatry

## 2021-06-24 DIAGNOSIS — F3110 Bipolar disorder, current episode manic without psychotic features, unspecified: Secondary | ICD-10-CM

## 2021-06-24 DIAGNOSIS — F322 Major depressive disorder, single episode, severe without psychotic features: Secondary | ICD-10-CM | POA: Diagnosis not present
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
## 2021-06-25 ENCOUNTER — Other Ambulatory Visit (HOSPITAL_COMMUNITY): Payer: Medicare Other | Admitting: Licensed Clinical Social Worker

## 2021-06-25 ENCOUNTER — Other Ambulatory Visit (HOSPITAL_COMMUNITY): Payer: Medicare Other

## 2021-06-25 ENCOUNTER — Other Ambulatory Visit: Payer: Self-pay

## 2021-06-25 ENCOUNTER — Ambulatory Visit (HOSPITAL_COMMUNITY): Payer: Medicare Other

## 2021-06-25 DIAGNOSIS — F322 Major depressive disorder, single episode, severe without psychotic features: Secondary | ICD-10-CM | POA: Diagnosis not present

## 2021-06-25 DIAGNOSIS — F3110 Bipolar disorder, current episode manic without psychotic features, unspecified: Secondary | ICD-10-CM

## 2021-06-25 NOTE — Progress Notes (Addendum)
Virtual Visit via Video Note  I connected with Devin Ellison on 06/25/21 at  9:00 AM EDT by a video enabled telemedicine application and verified that I am speaking with the correct person using two identifiers.  Location: Patient: Home Provider: Office   I discussed the limitations of evaluation and management by telemedicine and the availability of in person appointments. The patient expressed understanding and agreed to proceed.   I discussed the assessment and treatment plan with the patient. The patient was provided an opportunity to ask questions and all were answered. The patient agreed with the plan and demonstrated an understanding of the instructions.   The patient was advised to call back or seek an in-person evaluation if the symptoms worsen or if the condition fails to improve as anticipated.  I provided 15 minutes of non-face-to-face time during this encounter.   Derrill Center, NP    Psychiatric Initial Adult Assessment   Patient Identification: Devin Ellison MRN:  [id number] Date of Evaluation:  06/25/2021 Referral Source: PHP Chief Complaint:   Chief Complaint   Manic Behavior    Visit Diagnosis:    ICD-10-CM   1. Bipolar I disorder, most recent episode (or current) manic ([place])  F31.10       History of Present Illness:  per admission assessment note: " Devin Ellison is a 65 y.o. Caucasian male presents with depression and anxiety after the passing of his mother.  Reports multiple stressors led up to his most recent inpatient admission and manic behavior.  States he is currently followed by therapy and psychiatry.Multiple medication adjustments states he feels like he is in a good place currently.  Reports he recently had a phone interview with [employer] and has plans to get a new puppy.  States he is going to name the puppy Meda [employer] after his mother.  States his mother was 85  year old when she passed away."  Evaluation: [name] reports overall he is  feeling and doing a "lot better" than on admission.  Denying suicidal or homicidal ideations.  Denies auditory or visual hallucinations.  Patient presents less pressured and congruent.  Reports he continues to take and tolerate medications well.  Denies any substance abuse use. patient recently completed partial hospitalization programming and is transitioning to intensive outpatient programming on 06/23/2021  Associated Signs/Symptoms: Depression Symptoms:  insomnia, difficulty concentrating, anxiety, (Hypo) Manic Symptoms:  Elevated Mood, Anxiety Symptoms:  Excessive Worry, Psychotic Symptoms:  Paranoia, PTSD Symptoms: NA  Past Psychiatric History:   Previous Psychotropic Medications: Yes   Substance Abuse History in the last 12 months:  No.  Consequences of Substance Abuse: NA  Past Medical History:  Past Medical History:  Diagnosis Date   Bipolar 1 disorder ([place])    in remission   Depression    Diabetes mellitus, type II ([place])    Hypertension    Hypertension     Past Surgical History:  Procedure Laterality Date   ANKLE SURGERY Right    COLON SURGERY     polypectomy   HAND SURGERY Right    after fx    Family Psychiatric History:   Family History:  Family History  Problem Relation Age of Onset   Heart disease Mother    Cerebral palsy Sister 25   Leukemia Sister    Breast cancer Sister    Depression Paternal Uncle     Social History:   Social History   Socioeconomic History   Marital status: Married    Spouse name:  Not on file   Number of children: 2   Years of education: Not on file   Highest education level: Doctorate  Occupational History   Not on file  Tobacco Use   Smoking status: Former    Packs/day: 1.00    Types: Cigarettes    Quit date: 08/13/2013    Years since quitting: 7.8   Smokeless tobacco: Never  Substance and Sexual Activity   Alcohol use: Yes    Alcohol/week: 0.0 standard drinks    Comment: 6 beers/week   Drug use: No    Sexual activity: Not on file  Other Topics Concern   Not on file  Social History Narrative   Not on file   Social Determinants of Health   Financial Resource Strain: Not on file  Food Insecurity: Not on file  Transportation Needs: Not on file  Physical Activity: Not on file  Stress: Not on file  Social Connections: Not on file    Additional Social History:   Allergies:  No Known Allergies  Metabolic Disorder Labs: Lab Results  Component Value Date   HGBA1C 7.2 (H) 05/09/2021   MPG 159.94 05/09/2021   No results found for: PROLACTIN Lab Results  Component Value Date   CHOL 143 05/09/2021   TRIG 170 (H) 05/09/2021   HDL 52 05/09/2021   CHOLHDL 2.8 05/09/2021   VLDL 34 05/09/2021   LDLCALC 57 05/09/2021   LDLCALC 96 10/07/2019   Lab Results  Component Value Date   TSH 0.630 05/09/2021    Therapeutic Level Labs: Lab Results  Component Value Date   LITHIUM 1.4 (H) 08/13/2018   No results found for: CBMZ No results found for: VALPROATE  Current Medications: Current Outpatient Medications  Medication Sig Dispense Refill   amLODipine (NORVASC) 5 MG tablet Take 5 mg by mouth daily.     atorvastatin (LIPITOR) 10 MG tablet Take 10 mg by mouth daily.     divalproex (DEPAKOTE ER) 500 MG 24 hr tablet Take 1,000 mg by mouth daily.     lamoTRIgine (LAMICTAL) 200 MG tablet Take 1 tablet (200 mg total) by mouth 2 (two) times daily.     losartan (COZAAR) 100 MG tablet TAKE 1 TABLET BY MOUTH EVERY DAY (Patient taking differently: Take 100 mg by mouth daily.) 30 tablet 0   metFORMIN (GLUCOPHAGE) 1000 MG tablet Take 1,000 mg by mouth daily.     QUEtiapine (SEROQUEL XR) 200 MG 24 hr tablet Take 1 tablet (200 mg total) by mouth at bedtime.     No current facility-administered medications for this visit.    Musculoskeletal: Strength & Muscle Tone: within normal limits Gait & Station: normal Patient leans: N/A  Psychiatric Specialty Exam: Review of Systems  There were no  vitals taken for this visit.There is no height or weight on file to calculate BMI.  General Appearance: NA  Eye Contact:  Good  Speech:  Clear and Coherent  Volume:  Normal  Mood:  Anxious and Depressed  Affect:  Congruent  Thought Process:  Coherent  Orientation:  NA  Thought Content:  Logical  Suicidal Thoughts:  No  Homicidal Thoughts:  No  Memory:  NA  Judgement:  Good  Insight:  Good  Psychomotor Activity:  Normal  Concentration:  Concentration: Good  Recall:  Good  Fund of Knowledge:Good  Language: Good  Akathisia:  No  Handed:  Right  AIMS (if indicated):  done  Assets:  Communication Skills Desire for Improvement Social Support  ADL's:  Intact  Cognition: WNL  Sleep:  Good   Screenings: AUDIT    Flowsheet Row Admission (Discharged) from 04/30/2013 in [facility]  Alcohol Use Disorder Identification Test Final Score (AUDIT) 1      PHQ2-9    Flowsheet Row Counselor from 06/24/2021 in [facility] Counselor from 06/11/2021 in [place] Office Visit from 10/07/2019 in Primary Care at [facility] from [facility] from 04/15/2019 in Primary [facility] at [facility] Total Score 2 2 4 1 1   PHQ-9 Total Score 7 8 8  -- --      Flowsheet Row Counselor from 06/24/2021 in [place] [place] ED from 05/14/2021 in [facility] DEPT ED from 05/10/2021 in [place] Error: Question 2 not populated No Risk No Risk       Assessment and Plan:  Patient to start intensive outpatient program Continue medications as directed  Treatment plan was reviewed and agreed upon by NP T. Bobby Rumpf and patient Lynette Noah need for group services   Derrill Center, NP 10/21/202210:32 AM

## 2021-06-25 NOTE — Progress Notes (Signed)
Virtual Visit via Video Note   I connected with [name] on 06/25/21 at  9:00 AM EDT by a video enabled telemedicine application and verified that I am speaking with the correct person using two identifiers.   At orientation to the IOP program, Case Manager discussed the limitations of evaluation and management by telemedicine and the availability of in person appointments. The patient expressed understanding and agreed to proceed with virtual visits throughout the duration of the program.   Location:  Patient: Patient Home Provider: [facility]   History of Present Illness: Bipolar I Disorder    Observations/Objective: Check In: Case Manager checked in with all participants to review discharge dates, insurance authorizations, work-related documents and needs from the treatment team regarding medications. Drevin stated needs and engaged in discussion.    Initial Therapeutic Activity: Counselor facilitated a check-in with Sayed to assess for safety, sobriety and medication compliance.  Clinician also inquired about Ji's current emotional ratings, as well as any significant changes in thoughts, feelings or behavior since previous check in.  Dequandre presented for session on time and was alert, oriented x5, with no evidence or self-report of SI/HI or A/V H.  Blayden reported compliance with medication and denied use of alcohol or illicit substances.  Guy reported scores of 4/10 for depression, 4/10 for anxiety, and 0/10 for anger/irritability.  Thom denied any recent outbursts.  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  Jaicob reported that a recent struggle was experiencing 3 panic attacks yesterday after looking at his bank account and worrying about financial obligations.  Makani reported that he will try to reduce this anxiety by focusing more on creating a zero  balanced budget to follow in order to reduce debt.  Clinician provided [name] with a virtual handout from the [facility] on how to manage a budget and watch expenses more closely.         Second Therapeutic Activity: Clinician utilized a Radio broadcast assistant with group members today to guide discussion on topic of codependency.  This handout defined codependency as excessive emotional or psychological reliance upon someone who requires support on account of an illness or addiction.  It also explained how this issue presents in dysfunctional family systems, including behavior such as denying existence of problems, rigid boundaries on communication, strained trust, lack of individuality, and reinforcement of unhealthy coping mechanisms such as substance use.  Characteristics of co-dependent people were listed for assistance with identification, such as extreme need for approval/recognition, difficulty identifying feelings, poor communication, and more.  Members were also tasked with completing a questionnaire in order to identify signs of codependency and results were discussed afterward.  This handout also offered strategies for resolving co-dependency within one's network, including increased use of assertive communication skills in order to set appropriate boundaries.  Intervention was effective, as evidenced by [name] actively participating in discussion on the subject, competing questionnaire, and reporting that he has numerous co-dependent traits, such as experiencing trouble adjusting to change, lying/dishonesty, chronic anger, fear of abandonment, extreme need for approval, and more.  Cord reported that his former marriage reinforced co-dependence, and eventually resulted in divorce, as he was constantly putting his partner's needs above his own, and experienced consequences such as low self-esteem and alcoholism.  Carston reported that he is working to resolve co-dependency issues now by becoming more assertive,  setting healthier boundaries in his  relationships, and stated "I've got a lot to think about now.  This is an area I need to put some serious work into".     Assessment and Plan: Counselor recommends that Larnie remain in IOP treatment to better manage mental health symptoms, ensure stability and pursue completion of treatment plan goals. Counselor recommends adherence to crisis/safety plan, taking medications as prescribed, and following up with medical professionals if any issues arise.     Follow Up Instructions: Counselor will send Webex link for next session. Wynne was advised to call back or seek an in-person evaluation if the symptoms worsen or if the condition fails to improve as anticipated.     I provided 160 minutes of non-face-to-face time during this encounter.     Shade Flood, LCSW, LCAS 06/25/21

## 2021-06-28 ENCOUNTER — Other Ambulatory Visit: Payer: Self-pay

## 2021-06-28 ENCOUNTER — Other Ambulatory Visit (HOSPITAL_COMMUNITY): Payer: Medicare Other

## 2021-06-29 ENCOUNTER — Other Ambulatory Visit: Payer: Self-pay

## 2021-06-29 ENCOUNTER — Other Ambulatory Visit (HOSPITAL_COMMUNITY): Payer: Medicare Other | Admitting: Licensed Clinical Social Worker

## 2021-06-29 DIAGNOSIS — F3110 Bipolar disorder, current episode manic without psychotic features, unspecified: Secondary | ICD-10-CM

## 2021-06-29 DIAGNOSIS — F322 Major depressive disorder, single episode, severe without psychotic features: Secondary | ICD-10-CM | POA: Diagnosis not present

## 2021-06-29 NOTE — Progress Notes (Signed)
Virtual Visit via Video Note   I connected with Devin Ellison on 06/29/21 at  9:00 AM EDT by a video enabled telemedicine application and verified that I am speaking with the correct person using two identifiers.   At orientation to the IOP program, Case Manager discussed the limitations of evaluation and management by telemedicine and the availability of in person appointments. The patient expressed understanding and agreed to proceed with virtual visits throughout the duration of the program.   Location:  Patient: Patient Home Provider: [facility]   History of Present Illness: Bipolar I Disorder    Observations/Objective: Check In: Case Manager checked in with all participants to review discharge dates, insurance authorizations, work-related documents and needs from the treatment team regarding medications. Devin Ellison stated needs and engaged in discussion.    Initial Therapeutic Activity: Counselor facilitated a check-in with Devin Ellison to assess for safety, sobriety and medication compliance.  Clinician also inquired about Devin Ellison's current emotional ratings, as well as any significant changes in thoughts, feelings or behavior since previous check in.  Devin Ellison presented for session on time and was alert, oriented x5, with no evidence or self-report of SI/HI or A/V H.  Devin Ellison reported compliance with medication and denied use of alcohol or illicit substances.  Devin Ellison denied any recent panic attacks or outbursts.  Devin Ellison reported scores of 0/10 for depression, 0/10 for anxiety, and 0/10 for anger/irritability.  Devin Ellison denied any recent outbursts or panic attacks.  Devin Ellison reported recent success was starting his new job yesterday, which has alleviated some financial worries.  Devin Ellison reported that a current struggle is adjusting to this new schedule, since it required him to be absent from group the day before.         Second Therapeutic Activity: Counselor introduced [name], [place] Chaplain  to provide psychoeducation on topic of Grief and Loss with members today.  Devin Ellison began discussion by checking in with the group about their baseline mood today, general thoughts on what grief means to them and how it has affected them personally in the past.  Devin Ellison provided information on how the process of grief/loss can differ depending upon one's unique culture, and categories of loss one could experience (i.e. loss of a person, animal, relationship, job, identity, etc).  Devin Ellison encouraged members to be mindful of how pervasive loss can be, and how to recognize signs which could indicate that this is having an impact on one's overall mental health and wellbeing.  Intervention was effective, as evidenced by Devin Ellison participating in discussion with speaker on the subject and reporting that he experienced grief over the last month when his mother passed away, and in the 34's when his sister passed away, stating "It took a long time to accept that.  We were very close".  Devin Ellison reported that he was surprised he didn't find it more difficult to accept the passing of his mother, but was receptive to feedback from speaker on process of 'pre-grieving' being a potential factor.    Third Therapeutic Activity: Counselor covered topic of core beliefs with group today.  Counselor provided handout on the subject, which explained how everyone looks at the world differently, and two people can have the same experience, but have different interpretations of what happened.  Members were encouraged to think of these like sunglasses with different "shades" influencing perception towards positive or negative outcomes.  Examples of negative core beliefs were provided, such as "I'm unlovable", "I'm not good enough", and "I'm a bad  person".  Members were asked to identify which one(s) they could relate to, and then consider evidence which contradicts these beliefs.  Intervention was effective, as evidenced by Devin Ellison actively  participating in discussion on the subject, and reporting that in recent years he has begun to notice more prevalent negative thinking linked to core beliefs such as "I am unlovable" and "I will end up alone".  Devin Ellison reported that his divorce and recent falling out with another partner led him to feel more strongly that these thoughts were true, but during session was able to identify evidence which contradicted them, such as possessing desirable traits in a partner, and choosing to engage in therapy to work on himself and resolve any past issues that might have contributed to separation.  Devin Ellison stated "The problem with me is that I tend to act on my feelings instead of my mind.  I need to build patience and work on myself first".     Assessment and Plan: Counselor recommends that Devin Ellison remain in IOP treatment to better manage mental health symptoms, ensure stability and pursue completion of treatment plan goals. Counselor recommends adherence to crisis/safety plan, taking medications as prescribed, and following up with medical professionals if any issues arise.     Follow Up Instructions: Counselor will send Webex link for next session. Devin Ellison was advised to call back or seek an in-person evaluation if the symptoms worsen or if the condition fails to improve as anticipated.     I provided 180 minutes of non-face-to-face time during this encounter.     Devin Flood, LCSW, LCAS 06/29/21

## 2021-06-30 ENCOUNTER — Other Ambulatory Visit (HOSPITAL_COMMUNITY): Payer: Medicare Other | Admitting: Psychiatry

## 2021-06-30 ENCOUNTER — Other Ambulatory Visit: Payer: Self-pay

## 2021-06-30 DIAGNOSIS — F3110 Bipolar disorder, current episode manic without psychotic features, unspecified: Secondary | ICD-10-CM

## 2021-06-30 DIAGNOSIS — F322 Major depressive disorder, single episode, severe without psychotic features: Secondary | ICD-10-CM | POA: Diagnosis not present

## 2021-06-30 NOTE — Progress Notes (Signed)
Virtual Visit via Video Note   I connected with [name] on 06/30/21 at  9:15 AM EDT by a video enabled telemedicine application and verified that I am speaking with the correct person using two identifiers.   At orientation to the IOP program, Case Manager discussed the limitations of evaluation and management by telemedicine and the availability of in person appointments. The patient expressed understanding and agreed to proceed with virtual visits throughout the duration of the program.   Location:  Patient: Patient Home Provider: [facility] [place] [facility]   History of Present Illness: Bipolar I Disorder    Observations/Objective: Check In: Case Manager checked in with all participants to review discharge dates, insurance authorizations, work-related documents and needs from the treatment team regarding medications. Ranvir stated needs and engaged in discussion.    Initial Therapeutic Activity: Counselor facilitated a check-in with Staci to assess for safety, sobriety and medication compliance.  Clinician also inquired about Jancarlos's current emotional ratings, as well as any significant changes in thoughts, feelings or behavior since previous check in.  Maddon presented for session 15 minutes later due to getting off late from work.  He was alert, oriented x5, with no evidence or self-report of SI/HI or A/V H.  Yoan reported compliance with medication and denied use of alcohol or illicit substances.  Richad denied any recent panic attacks or outbursts.  Ariez reported scores of 0/10 for depression, 0/10 for anxiety, and 0/10 for anger/irritability.  Maui denied any recent outbursts or panic attacks.  Jerico reported recent success was working a shift at his new job and doing so well that management is discussing transfer to a more agreeable shift that also highlights his skillset.  Dierks reported that his only struggle is getting to group on time due to his current schedule.           Second  Therapeutic Activity: Counselor discussed topic of gratitude journaling with members as a form of self-care.  Counselor virtually shared a handout with the group today which explained the benefits of this practice, including reduction in stress, increased happiness, and self-esteem.  Tips were also provided to aid in practice, such as taking time with entries, writing about people one is grateful for, and setting goal for two entries per week for at least 10-20 minutes at a time.  Counselor also provided group members with a variety of journaling prompts to choose from today, and encouraged each member to take time to write about something they are grateful for, with examples such as "Something beautiful I recently saw was..", "Something I can be proud of is.", "A reason to be excited for the future is." and more.  Members were encouraged to share their entry with the group, along with their perspective on the activity and motivation level towards making this a habit. Intervention was effective, as evidenced by [name] participating in journaling activity, and choosing the prompt "Someone I can always rely upon is.".  Jatavian reported that the person he chose to write about is his ex-wife, who is also his best friend.  He reported that despite what they've been through together, she has stuck by his side as a friend, assisting him with mental health, financial, and employment woes.  Corwyn stated "She is someone I can call upon day or night and I'm so grateful for that.  I don't know where I would be without her".  Micai reported that he enjoyed this activity and would consider sharing the journal entry with  this person to show appreciation for their ongoing support.  Third Therapeutic Activity: Counselor also provided psychoeducation on positive affirmations today.  Counselor explained how these are positive statements which can be spoken out loud or recited mentally to challenge negative thoughts and/or core  beliefs to improve mood and outlook each day.  Counselor provided a comprehensive list of affirmations to members with different categories, including ones for health, confidence, success, and happiness.  Counselor invited members to look through this list and identify any which resonated with them, and practice saying them out loud with sincerity.  Intervention was effective, as evidenced by [name] actively participating in discussion on the subject, and reporting that he found several affirmations to be appealing and suitable for practice, such as "I get better every day in each and every way", "I learn from my challenges and always find a way to overcome them", and "I can do whatever I focus my mind on".       Assessment and Plan: Counselor recommends that Cordon remain in IOP treatment to better manage mental health symptoms, ensure stability and pursue completion of treatment plan goals. Counselor recommends adherence to crisis/safety plan, taking medications as prescribed, and following up with medical professionals if any issues arise.     Follow Up Instructions: Counselor will send Webex link for next session. Izaak was advised to call back or seek an in-person evaluation if the symptoms worsen or if the condition fails to improve as anticipated.     I provided 165 minutes of non-face-to-face time during this encounter.     Shade Flood, LCSW, LCAS 06/30/21

## 2021-06-30 NOTE — Progress Notes (Signed)
Virtual Visit via Video Note  I connected with Devin Ellison on 06/30/21 at  9:00 AM EDT by a video enabled telemedicine application and verified that I am speaking with the correct person using two identifiers.  Location: Patient: Home Provider: Office   I discussed the limitations of evaluation and management by telemedicine and the availability of in person appointments. The patient expressed understanding and agreed to proceed.   I discussed the assessment and treatment plan with the patient. The patient was provided an opportunity to ask questions and all were answered. The patient agreed with the plan and demonstrated an understanding of the instructions.   The patient was advised to call back or seek an in-person evaluation if the symptoms worsen or if the condition fails to improve as anticipated.  I provided 15 minutes of non-face-to-face time during this encounter.   Derrill Center, NP   [facility] Partial Hospitalization Outpatient Program Discharge Summary  Devin Ellison [id number]  Admission date:06/11/2021 Discharge date: 06/23/2021  Reason for admission: Devin Ellison is a 65 y.o. Caucasian male presents with depression and anxiety after the passing of his mother.  Reports multiple stressors led up to his most recent inpatient admission and manic behavior.  States he is currently followed by therapy and psychiatry.   Progress in Program Toward Treatment Goals: Ongoing, patient attended and participated with daily group session with active and engaged participation.  Continues to deny suicidal or homicidal ideations.  Denies auditory or visual hallucinations.  Continues to present slightly pressured however symptoms appear to be stabilizing from recent manic episode.  Patient appears focused with the ability to concentrate better.  He reports taking medications as directed.  Patient to transition to intensive outpatient programming  Progress  (rationale): Stepping down to intensive outpatient programming (iop)  Take all medications as prescribed. Keep all follow-up appointments as scheduled.  Do not consume alcohol or use illegal drugs while on prescription medications. Report any adverse effects from your medications to your primary care provider promptly.  In the event of recurrent symptoms or worsening symptoms, call 911, a crisis hotline, or go to the nearest emergency department for evaluation.     Derrill Center, NP 06/30/2021

## 2021-07-01 ENCOUNTER — Encounter (HOSPITAL_COMMUNITY): Payer: Self-pay | Admitting: Licensed Clinical Social Worker

## 2021-07-01 ENCOUNTER — Encounter (HOSPITAL_COMMUNITY): Payer: Self-pay | Admitting: Family

## 2021-07-01 ENCOUNTER — Other Ambulatory Visit (HOSPITAL_COMMUNITY): Payer: Medicare Other

## 2021-07-01 ENCOUNTER — Other Ambulatory Visit: Payer: Self-pay
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
## 2021-07-01 NOTE — Patient Instructions (Signed)
D:  Patient requesting discharge today due to his work schedule.  A:  Follow up with Dr. Chucky May, Drs. Poag and Plank also.  Strongly recommended support groups.  R:  Patient receptive.

## 2021-07-01 NOTE — Progress Notes (Addendum)
  [facility] Intensive Outpatient Program Discharge Summary  Devin Ellison [id number]  Admission date: 06/24/2021 Discharge date: 07/01/2021  Reason for admission: per admission assessment note: " Devin Ellison is a 65 y.o. Caucasian male presents with depression and anxiety after the passing of his mother.  Reports multiple stressors led up to his most recent inpatient admission and manic behavior.  States he is currently followed by therapy and psychiatry.Multiple medication adjustments states he feels like he is in a good place currently.  Reports he recently had a phone interview with [employer] and has plans to get a new puppy.  States he is going to name the puppy Meda Coffee after his mother.  States his mother was 52  year old when she passed away."   Progress in Program Toward Treatment Goals: Ongoing, Devin Ellison attended and participated with daily group session with active and engaged participation.  Denying suicidal or homicidal ideations.  Denies auditory or visual hallucinations.  He recently completed partial hospitalization programming and has requested to discharge early from intensive outpatient programming due to his current work schedule.  Declined medication refills.  Patient to keep all follow-up appointments with outpatient providers  Progress (rationale): Keep all follow-up appointments with all outpatient providers.  Case management to provide additional outpatient support groups.  Take all medications as prescribed. Keep all follow-up appointments as scheduled.  Do not consume alcohol or use illegal drugs while on prescription medications. Report any adverse effects from your medications to your primary care provider promptly.  In the event of recurrent symptoms or worsening symptoms, call 911, a crisis hotline, or go to the nearest emergency department for evaluation.    Devin Ala NP 07/01/2021

## 2021-07-02 ENCOUNTER — Other Ambulatory Visit: Payer: Self-pay

## 2021-07-02 ENCOUNTER — Other Ambulatory Visit (HOSPITAL_COMMUNITY): Payer: Medicare Other

## 2021-07-05 ENCOUNTER — Other Ambulatory Visit: Payer: Self-pay

## 2021-07-05 ENCOUNTER — Other Ambulatory Visit (HOSPITAL_COMMUNITY): Payer: Medicare Other

## 2021-07-06 NOTE — Psych (Signed)
Virtual Visit via Video Note  I connected with Devin Ellison on 06/07/21 at  9:00 AM EDT by a video enabled telemedicine application and verified that I am speaking with the correct person using two identifiers.  Location: Patient: patient home Provider: clinical home office   I discussed the limitations of evaluation and management by telemedicine and the availability of in person appointments. The patient expressed understanding and agreed to proceed.  I discussed the assessment and treatment plan with the patient. The patient was provided an opportunity to ask questions and all were answered. The patient agreed with the plan and demonstrated an understanding of the instructions.   The patient was advised to call back or seek an in-person evaluation if the symptoms worsen or if the condition fails to improve as anticipated.  Pt was provided 240 minutes of non-face-to-face time during this encounter.   Lorin Glass, LCSW    [facility] PHP THERAPIST PROGRESS NOTE  Devin Ellison [id number]  Session Time: 9:00 - 10:00  Participation Level: Active  Behavioral Response: CasualAlertDepressed  Type of Therapy: Group Therapy  Treatment Goals addressed: Coping  Interventions: CBT, DBT, Supportive, and Reframing  Summary: Clinician led check-in regarding current stressors and situation. Clinician utilized active listening and empathetic response and validated patient emotions. Clinician facilitated processing group on pertinent issues.   Therapist Response: Devin Ellison is a 65 y.o. male who presents with post-manic and depressive symptoms. Patient arrived within time allowed and reports that he is feeling "okay." Patient rates his mood at a 5 on a scale of 1-10 with 10 being great. 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 Pt reports he went on a date and is feeling good about it. Pt presents as hyper-verbal with  mildly pressured speech. Pt able to process. Pt engaged in discussion.             Session Time: 10:00 - 11:00   Participation Level: Active   Behavioral Response: CasualAlertDepressed   Type of Therapy: Group Therapy   Treatment Goals addressed: Coping   Interventions: CBT, DBT, Supportive and Reframing   Summary: Cln introduced topic of stress management and the model of the "4 A's of stress management:" avoid, alter, accept, and adapt. Group members worked through Advice worker and discussed barriers to utilizing the 4 A's for stressors.     Therapist Response: Pt engaged in discussion and reports understanding of how to utilize the 4 A's.            Session Time: 11:00- 12:00   Participation Level: Active   Behavioral Response: CasualAlertDepressed   Type of Therapy: Group Therapy, OT   Treatment Goals addressed: Coping   Interventions: Psychosocial skills training, Supportive   Summary: Occupational Therapy group   Therapist Response: Patient engaged in group when prompted. See OT note.         Session Time: 12:00 -1:00   Participation Level: Active   Behavioral Response: CasualAlertDepressed   Type of Therapy: Group therapy   Treatment Goals addressed: Coping   Interventions: CBT; Solution focused; Supportive; Reframing   Summary: 12:00 - 12:50: Cln introduced topic of boundaries. Cln discussed how boundaries inform our relationships and affect self-esteem and personal agency. Group discussed the three types of boundaries: rigid, porous, and healthy and when each type is most helpful/harmful.  12:50 -1:00 Clinician led check-out. Clinician assessed for immediate needs, medication compliance and efficacy, and safety concerns  Therapist Response: 12:00 - 12:50: Pt engaged in discussion and reports having mostly porous boundaries.  12:50 - 1:00: At check-out, patient rates his mood at a 7 on a scale of 1-10 with 10 being great. Pt reports afternoon plans of  unpacking. Pt demonstrates some progress as evidenced by participating in first group session. Patient denies SI/HI at the end of group.     Suicidal/Homicidal: Nowithout intent/plan  Plan: Pt will continue in PHP while working to decrease manic and depressive symptoms, increase emotion regulation, and increase ability to manage symptoms in a healthy manner.   Diagnosis: Bipolar affective disorder, current episode manic, current episode severity unspecified ([place]) [F31.10]    1. Bipolar affective disorder, current episode manic, current episode severity unspecified ([place])       Lorin Glass, LCSW 07/06/2021

## 2021-07-14 NOTE — Psych (Signed)
Virtual Visit via Video Note  I connected with Devin Ellison on 06/10/21 at  9:00 AM EDT by a video enabled telemedicine application and verified that I am speaking with the correct person using two identifiers.  Location: Patient: patient home Provider: clinical home office   I discussed the limitations of evaluation and management by telemedicine and the availability of in person appointments. The patient expressed understanding and agreed to proceed.  I discussed the assessment and treatment plan with the patient. The patient was provided an opportunity to ask questions and all were answered. The patient agreed with the plan and demonstrated an understanding of the instructions.   The patient was advised to call back or seek an in-person evaluation if the symptoms worsen or if the condition fails to improve as anticipated.  Pt was provided 240 minutes of non-face-to-face time during this encounter.   Lorin Glass, LCSW    [facility] PHP THERAPIST PROGRESS NOTE  Devin Ellison [id number]  Session Time: 9:00 - 10:00  Participation Level: Active  Behavioral Response: CasualAlertDepressed  Type of Therapy: Group Therapy  Treatment Goals addressed: Coping  Interventions: CBT, DBT, Supportive, and Reframing  Summary: Clinician led check-in regarding current stressors and situation. Clinician utilized active listening and empathetic response and validated patient emotions. Clinician facilitated processing group on pertinent issues.   Therapist Response: Devin Ellison is a 65 y.o. male who presents with post-manic and depressive symptoms. Patient arrived within time allowed and reports that he is feeling "good." Patient rates his mood at a 7 on a scale of 1-10 with 10 being great. Pt reports he was let go from his job and he is stressed about finances. Pt states he moved into his new apartment and is mostly unpacked. Pt exhibits incongruent affect and appears upbeat and is  smiling however stating he feels "panicked," signaling he is still coming down from mania. Pt able to process. Pt engaged in discussion.         Session Time: 10:00 - 11:00   Participation Level: Active   Behavioral Response: CasualAlertDepressed   Type of Therapy: Group Therapy   Treatment Goals addressed: Coping   Interventions: CBT, DBT, Supportive and Reframing   Summary: Cln led discussion on staying present. Cln highlighted CBT distorted thoughts of catastrophizing and fortune telling and encouraged pt's to think about today and tomorrow and not past that to address those distortions. Group members shared struggles they have with looking into the future.     Therapist Response:  Pt is able to process and discuss how to apply strategies discussed.            Session Time: 11:00- 12:00   Participation Level: Active   Behavioral Response: CasualAlertDepressed   Type of Therapy: Group Therapy, OT   Treatment Goals addressed: Coping   Interventions: Psychosocial skills training, Supportive   Summary: Occupational Therapy group   Therapist Response: Patient engaged in group when prompted. See OT note.          Session Time: 12:00 -1:00   Participation Level: Active   Behavioral Response: CasualAlertDepressed   Type of Therapy: Group therapy   Treatment Goals addressed: Coping   Interventions: CBT; Solution focused; Supportive; Reframing   Summary: 12:00 - 12:50: Cln continued topic of boundaries and introduced how to set and maintain healthy boundaries. Cln utilized handout "how to set boundaries" and group members worked through examples to practice setting appropriate boundaries.  12:50 -1:00 Clinician led check-out. Clinician assessed  for immediate needs, medication compliance and efficacy, and safety concerns   Therapist Response: 12:00 - 12:50: Pt engaged in discussion and reports struggle with setting boundaries.  12:50 - 1:00: At check-out, patient  rates his mood at a 7 on a scale of 1-10 with 10 being great. Pt reports afternoon plans of looking for jobs. Pt demonstrates some progress as evidenced by stable mood. Patient denies SI/HI at the end of group.     Suicidal/Homicidal: Nowithout intent/plan  Plan: Pt will continue in PHP while working to decrease manic and depressive symptoms, increase emotion regulation, and increase ability to manage symptoms in a healthy manner.   Diagnosis: Bipolar affective disorder, current episode manic, current episode severity unspecified ([place]) [F31.10]    1. Bipolar affective disorder, current episode manic, current episode severity unspecified ([place])       Lorin Glass, LCSW 07/14/2021

## 2021-07-26 NOTE — Psych (Signed)
Virtual Visit via Video Note  I connected with Devin Ellison on 06/11/21 at  9:00 AM EDT by a video enabled telemedicine application and verified that I am speaking with the correct person using two identifiers.  Location: Patient: patient home Provider: clinical home office   I discussed the limitations of evaluation and management by telemedicine and the availability of in person appointments. The patient expressed understanding and agreed to proceed.  I discussed the assessment and treatment plan with the patient. The patient was provided an opportunity to ask questions and all were answered. The patient agreed with the plan and demonstrated an understanding of the instructions.   The patient was advised to call back or seek an in-person evaluation if the symptoms worsen or if the condition fails to improve as anticipated.  Pt was provided 240 minutes of non-face-to-face time during this encounter.   Lorin Glass, LCSW    [facility] [place] PHP THERAPIST PROGRESS NOTE  Devin Ellison [id number]  Session Time: 9:00 - 10:00  Participation Level: Active  Behavioral Response: CasualAlertDepressed  Type of Therapy: Group Therapy  Treatment Goals addressed: Coping  Interventions: CBT, DBT, Supportive, and Reframing  Summary: Clinician led check-in regarding current stressors and situation. Clinician utilized active listening and empathetic response and validated patient emotions. Clinician facilitated processing group on pertinent issues.   Therapist Response: Devin Ellison is a 65 y.o. male who presents with post-manic and depressive symptoms. Patient arrived within time allowed and reports that he is feeling "great." Patient rates his mood at a 9 on a scale of 1-10 with 10 being great. Pt reports he spent time with his ex-wife and scheduled interviews yesterday. Pt reports sleeping approximately 10 hours. Pt reports having a "girlfriend" who lives in [place] he just met on an  on-line dating site and talked to her all night. Pt presents with increased energy and hyper-verbal speech. Pt able to process. Pt engaged in discussion.         Session Time: 10:00 - 11:00   Participation Level: Active   Behavioral Response: CasualAlertDepressed   Type of Therapy: Group Therapy   Treatment Goals addressed: Coping   Interventions: CBT, DBT, Supportive and Reframing   Summary: Cln led discussion on ways to manage stressors and feelings over the weekend. Group members  brainstormed things to do over the weekend for multiple levels of energy, access, and moods. Cln reviewed crisis services should they be needed and provided pt's with the text crisis line, mobile crisis, national suicide hotline, [facility] 24/7 line, and information on [facility].     Therapist Response: Pt engaged in discussion and is able to identify 3 ideas of what to do over the weekend to keep their mind engaged.           Session Time: 11:00- 12:00   Participation Level: Active   Behavioral Response: CasualAlertDepressed   Type of Therapy: Group Therapy   Treatment Goals addressed: Coping   Interventions: CBT; Solution focused; Supportive; Reframing   Summary: Doctor, general practice, J. Athas led group on holistic wellness and how it can be used to support our mental health.   Therapist Response: Pt engaged in discussion.         Session Time: 12:00 -1:00   Participation Level: Active   Behavioral Response: CasualAlertDepressed   Type of Therapy: Group therapy, OT   Treatment Goals addressed: Coping   Interventions: Psychosocial skills training, supportive   Summary: 12:00 - 12:50: Occupational Therapy group 12:50 -  1:00 Clinician led check-out. Clinician assessed for immediate needs, medication compliance and efficacy, and safety concerns   Therapist Response: 12:00 - 12:50: Patient engaged in group when prompted. See OT note.   12:50 - 1:00: At check-out, patient rates his mood  at a 9 on a scale of 1-10 with 10 being great. Pt reports afternoon plans of getting a puppy and spending time with his family. Pt demonstrates some progress as evidenced by future oriented thinking. Patient denies SI/HI at the end of group.     Suicidal/Homicidal: Nowithout intent/plan  Plan: Pt will continue in PHP while working to decrease manic and depressive symptoms, increase emotion regulation, and increase ability to manage symptoms in a healthy manner.   Diagnosis: Bipolar I disorder, most recent episode (or current) manic ([place]) [F31.10]    1. Bipolar I disorder, most recent episode (or current) manic ([place])   2. Major depressive disorder, single episode, severe ([place])       Lorin Glass, LCSW 07/26/2021

## 2021-07-27 NOTE — Psych (Signed)
Virtual Visit via Video Note  I connected with Devin Ellison on 06/11/21 at  9:00 AM EDT by a video enabled telemedicine application and verified that I am speaking with the correct person using two identifiers.  Location: Patient: patient home Provider: clinical home office   I discussed the limitations of evaluation and management by telemedicine and the availability of in person appointments. The patient expressed understanding and agreed to proceed.  I discussed the assessment and treatment plan with the patient. The patient was provided an opportunity to ask questions and all were answered. The patient agreed with the plan and demonstrated an understanding of the instructions.   The patient was advised to call back or seek an in-person evaluation if the symptoms worsen or if the condition fails to improve as anticipated.  Pt was provided 120 minutes of non-face-to-face time during this encounter.   Lorin Glass, LCSW    [facility] PHP THERAPIST PROGRESS NOTE  BROC CASPERS [id number]  Session Time: 9:00 - 10:00  Participation Level: Active  Behavioral Response: CasualAlertDepressed  Type of Therapy: Group Therapy  Treatment Goals addressed: Coping  Interventions: CBT, DBT, Supportive, and Reframing  Summary: Clinician led check-in regarding current stressors and situation. Clinician utilized active listening and empathetic response and validated patient emotions. Clinician facilitated processing group on pertinent issues.   Therapist Response: [name] is a 65 y.o. male who presents with post-manic and depressive symptoms. Patient arrived within time allowed and reports that he is feeling "good." Patient rates his mood at a 8 on a scale of 1-10 with 10 being great. Pt reports his weekend went well and he got his puppy and saw his family for the first time since his manic episode. Pt reports disrupted sleep due to the puppy. Pt able to process. Pt engaged in  discussion.         Session Time: 10:00 - 11:00   Participation Level: Active   Behavioral Response: CasualAlertDepressed   Type of Therapy: Group Therapy   Treatment Goals addressed: Coping   Interventions: CBT, DBT, Supportive and Reframing   Summary: Cln led discussion on DBT dialectics and balance. Cln encouraged pt's to utilize AND statements to cue their brain to hold two opposing ideas. Cln provided examples and group members created their own AND statements.    Therapist Response:  Pt engaged in discussion and created AND statement around recognzing progress.    **Pt chose to leave group at 11 reporting technical issues.     Suicidal/Homicidal: Nowithout intent/plan  Plan: Pt will continue in PHP while working to decrease manic and depressive symptoms, increase emotion regulation, and increase ability to manage symptoms in a healthy manner.   Diagnosis: Bipolar I disorder, most recent episode (or current) manic ([place]) [F31.10]    1. Bipolar I disorder, most recent episode (or current) manic ([place])       Lorin Glass, LCSW 07/27/2021

## 2021-08-10 NOTE — Psych (Signed)
Virtual Visit via Video Note  I connected with Devin Ellison on 06/17/21 at  9:00 AM EDT by a video enabled telemedicine application and verified that I am speaking with the correct person using two identifiers.  Location: Patient: patient home Provider: clinical home office   I discussed the limitations of evaluation and management by telemedicine and the availability of in person appointments. The patient expressed understanding and agreed to proceed.  I discussed the assessment and treatment plan with the patient. The patient was provided an opportunity to ask questions and all were answered. The patient agreed with the plan and demonstrated an understanding of the instructions.   The patient was advised to call back or seek an in-person evaluation if the symptoms worsen or if the condition fails to improve as anticipated.  Pt was provided 240 minutes of non-face-to-face time during this encounter.   Lorin Glass, LCSW    [facility] PHP THERAPIST PROGRESS NOTE  Devin Ellison [id number]  Session Time: 9:00 - 10:00  Participation Level: Active  Behavioral Response: CasualAlertDepressed  Type of Therapy: Group Therapy  Treatment Goals addressed: Coping  Interventions: CBT, DBT, Supportive, and Reframing  Summary: Clinician led check-in regarding current stressors and situation. Clinician utilized active listening and empathetic response and validated patient emotions. Clinician facilitated processing group on pertinent issues.   Therapist Response: Devin Ellison is a 65 y.o. male who presents with post-manic and depressive symptoms. Patient arrived within time allowed and reports that he is feeling "been better." Patient rates his mood at a 7 on a scale of 1-10 with 10 being great. Pt reports yesterday was stressful. Pt reports receiving job declines and feeling disheartened. Pt states his new puppy has been keeping him up at night and affecting his mood. Pt able to  process. Pt engaged in discussion.         Session Time: 10:00 - 11:00   Participation Level: Active   Behavioral Response: CasualAlertDepressed   Type of Therapy: Group Therapy   Treatment Goals addressed: Coping   Interventions: CBT, DBT, Supportive and Reframing   Summary: Cln led discussion on increasing support. Group members shared what supports they have and where it is lacking. Cln encouraged ways to increase adult friendships including accepting invitations, joining clubs/activities, reaching out, and support groups.     Therapist Response: Pt engaged in discussion and is able to determine ways to increase their support.            Session Time: 11:00- 12:00   Participation Level: Active   Behavioral Response: CasualAlertDepressed   Type of Therapy: Group Therapy, OT   Treatment Goals addressed: Coping   Interventions: Psychosocial skills training, Supportive   Summary: Occupational Therapy group   Therapist Response: Patient engaged in group when prompted. See OT note.           Session Time: 12:00 -1:00   Participation Level: Active   Behavioral Response: CasualAlertDepressed   Type of Therapy: Group therapy   Treatment Goals addressed: Coping   Interventions: CBT; Solution focused; Supportive; Reframing   Summary: 12:00 - 12:50: Cln led discussion on positive self-esteem. Group shared struggles they experience with self-esteem. Group highlights difficulty in accepting compliments. Cln encouraged pt's to use checking the facts as a way to challenge the negative thinking which accompanies receving compliments. Cln suggested pt's consider making a compliment file in which they document positive things people say to/about them to review when they are feeling poorly about themselves.  12:50 -1:00 Clinician led check-out. Clinician assessed for immediate needs, medication compliance and efficacy, and safety concerns   Therapist Response: 12:00 - 12:50:  Pt engaged in conversation and shares difficulty in accepting compliments. Pt is able to process and reports being open to starting a compliment file.  12:50 - 1:00: At check-out, patient rates his mood at a 6 on a scale of 1-10 with 10 being great. Pt reports afternoon plans of playing guitar. Pt demonstrates some progress as evidenced by decreased hyperverbality. Patient denies SI/HI at the end of group.     Suicidal/Homicidal: Nowithout intent/plan  Plan: Pt will continue in PHP while working to decrease manic and depressive symptoms, increase emotion regulation, and increase ability to manage symptoms in a healthy manner.   Diagnosis: Bipolar affective disorder, current episode manic, current episode severity unspecified ([place]) [F31.10]    1. Bipolar affective disorder, current episode manic, current episode severity unspecified ([place])       Lorin Glass, LCSW 08/10/2021

## 2021-08-19 NOTE — Psych (Signed)
Virtual Visit via Video Note  I connected with Devin Ellison on 06/18/21 at  9:00 AM EDT by a video enabled telemedicine application and verified that I am speaking with the correct person using two identifiers.  Location: Patient: patient home Provider: clinical home office   I discussed the limitations of evaluation and management by telemedicine and the availability of in person appointments. The patient expressed understanding and agreed to proceed.  I discussed the assessment and treatment plan with the patient. The patient was provided an opportunity to ask questions and all were answered. The patient agreed with the plan and demonstrated an understanding of the instructions.   The patient was advised to call back or seek an in-person evaluation if the symptoms worsen or if the condition fails to improve as anticipated.  Pt was provided 180 minutes of non-face-to-face time during this encounter.   Lorin Glass, LCSW    [facility] [place] PHP THERAPIST PROGRESS NOTE  Devin Ellison [id number]  Session Time: 9:00 - 10:00  Participation Level: Active  Behavioral Response: CasualAlertDepressed  Type of Therapy: Group Therapy  Treatment Goals addressed: Coping  Interventions: CBT, DBT, Supportive, and Reframing  Summary: Clinician led check-in regarding current stressors and situation. Clinician utilized active listening and empathetic response and validated patient emotions. Clinician facilitated processing group on pertinent issues.   Therapist Response: Devin Ellison is a 65 y.o. male who presents with post-manic and depressive symptoms. Patient arrived within time allowed and reports that he is feeling "good." Patient rates his mood at a 8 on a scale of 1-10 with 10 being great. Pt reports he has an interview today and he feels "nervous." Pt reports his mother's funeral is this weekend and he is feeling "unsure" of how he will handle it. Pt able to process. Pt engaged in  discussion.         Session Time: 10:00 - 11:00   Participation Level: Active   Behavioral Response: CasualAlertDepressed   Type of Therapy: Group Therapy   Treatment Goals addressed: Coping   Interventions: CBT, DBT, Supportive and Reframing   Summary: Cln led discussion on safety and the ways in which we can seek and own it in ourselves. Group members shared how they view safety and situations which hinder safety. Cln encouraged pt's to think about emotional safety and ways to foster it.      Therapist Response: Pt engaged in discussion and reports difficulty with finding safety in himself. Pt able to process.             Session Time: 11:00- 12:00   Participation Level: Active   Behavioral Response: CasualAlertDepressed   Type of Therapy: Group Therapy, OT   Treatment Goals addressed: Coping   Interventions: Psychosocial skills training, Supportive   Summary: Occupational Therapy group   Therapist Response: Patient engaged in group when prompted. See OT note.        **Pt chose to leave group at 12 due to conflicting appointment. Pt denies SI/HI before leaving.      Suicidal/Homicidal: Nowithout intent/plan  Plan: Pt will continue in PHP while working to decrease manic and depressive symptoms, increase emotion regulation, and increase ability to manage symptoms in a healthy manner.   Diagnosis: Bipolar I disorder, most recent episode (or current) manic ([place]) [F31.10]    1. Bipolar I disorder, most recent episode (or current) manic ([place])       Lorin Glass, LCSW 08/19/2021

## 2021-08-19 NOTE — Psych (Signed)
Virtual Visit via Video Note  I connected with Devin Ellison on 06/21/21 at  9:00 AM EDT by a video enabled telemedicine application and verified that I am speaking with the correct person using two identifiers.  Location: Patient: patient home Provider: clinical home office   I discussed the limitations of evaluation and management by telemedicine and the availability of in person appointments. The patient expressed understanding and agreed to proceed.  I discussed the assessment and treatment plan with the patient. The patient was provided an opportunity to ask questions and all were answered. The patient agreed with the plan and demonstrated an understanding of the instructions.   The patient was advised to call back or seek an in-person evaluation if the symptoms worsen or if the condition fails to improve as anticipated.  Pt was provided 240 minutes of non-face-to-face time during this encounter.   Lorin Glass, LCSW    [facility] PHP THERAPIST PROGRESS NOTE  Devin Ellison [id number]  Session Time: 9:00 - 10:00  Participation Level: Active  Behavioral Response: CasualAlertDepressed  Type of Therapy: Group Therapy  Treatment Goals addressed: Coping  Interventions: CBT, DBT, Supportive, and Reframing  Summary: Clinician led check-in regarding current stressors and situation. Clinician utilized active listening and empathetic response and validated patient emotions. Clinician facilitated processing group on pertinent issues.   Therapist Response: Devin Ellison is a 65 y.o. male who presents with post-manic and depressive symptoms. Patient arrived within time allowed and reports that he is feeling "good." Patient rates his mood at a 8 on a scale of 1-10 with 10 being great. Pt reports the funeral went well this weekend and he feels at peace. Pt demonstrates continued impulsive behaviors. Pt able to process. Pt engaged in discussion.         Session Time: 10:00 -  11:00   Participation Level: Active   Behavioral Response: CasualAlertDepressed   Type of Therapy: Group Therapy   Treatment Goals addressed: Coping   Interventions: CBT, DBT, Supportive and Reframing   Summary: Cln led discussion on CBT reframing and how to look for alterntative possibilities to decrease distress from negative thinking. Cln utilized the CBT triangle to aid discussion. Group members shared difficult situations and worked with group to reframe the negative thinking present.      Therapist Response: Pt engaged in Ambulance person.             Session Time: 11:00- 12:00   Participation Level: Active   Behavioral Response: CasualAlertDepressed   Type of Therapy: Group Therapy, OT   Treatment Goals addressed: Coping   Interventions: Psychosocial skills training, Supportive   Summary: Occupational Therapy group   Therapist Response: Patient engaged in group when prompted. See OT note.           Session Time: 12:00 -1:00   Participation Level: Active   Behavioral Response: CasualAlertDepressed   Type of Therapy: Group therapy   Treatment Goals addressed: Coping   Interventions: CBT; Solution focused; Supportive; Reframing   Summary: 12:00 - 12:50: Cln led discussion on personal standards and they way in which it impacts the way we view ourselves and our abilties. Group members discussed judgment, struggles, and barriers they experience in terms of personal standards. Cln brought in topics of balance, grace, and kindness. Cln proposed the "best friend test" as a way to calibrate whether we are vieweing our situation with kindness or harshness.  12:50 -1:00 Clinician led check-out. Clinician assessed for immediate needs, medication compliance and  efficacy, and safety concerns   Therapist Response: 12:00 - 12:50: Pt engaged in discussion and reports willingness to utilize the best friend test.  12:50 - 1:00: At check-out, patient rates his mood at a 8  on a scale of 1-10 with 10 being great. Pt reports afternoon plans of rearranging his home. Pt demonstrates some progress as evidenced by increased public appropriate behaviors. Patient denies SI/HI at the end of group.     Suicidal/Homicidal: Nowithout intent/plan  Plan: Pt will continue in PHP while working to decrease manic and depressive symptoms, increase emotion regulation, and increase ability to manage symptoms in a healthy manner.   Diagnosis: Bipolar affective disorder, current episode manic, current episode severity unspecified ([place]) [F31.10]    1. Bipolar affective disorder, current episode manic, current episode severity unspecified ([place])       Lorin Glass, LCSW 08/19/2021

## 2021-09-06 DIAGNOSIS — F3112 Bipolar disorder, current episode manic without psychotic features, moderate: Secondary | ICD-10-CM | POA: Diagnosis not present

## 2021-09-07 NOTE — Psych (Signed)
Virtual Visit via Video Note  I connected with Devin Ellison on 06/23/21 at  9:00 AM EDT by a video enabled telemedicine application and verified that I am speaking with the correct person using two identifiers.  Location: Patient: patient home Provider: clinical home office   I discussed the limitations of evaluation and management by telemedicine and the availability of in person appointments. The patient expressed understanding and agreed to proceed.  I discussed the assessment and treatment plan with the patient. The patient was provided an opportunity to ask questions and all were answered. The patient agreed with the plan and demonstrated an understanding of the instructions.   The patient was advised to call back or seek an in-person evaluation if the symptoms worsen or if the condition fails to improve as anticipated.  Pt was provided 180 minutes of non-face-to-face time during this encounter.   Lorin Glass, LCSW    [facility] [place] PHP THERAPIST PROGRESS NOTE  Devin Ellison [id number]  Session Time: 9:00 - 10:00  Participation Level: Active  Behavioral Response: CasualAlertDepressed  Type of Therapy: Group Therapy  Treatment Goals addressed: Coping  Interventions: CBT, DBT, Supportive, and Reframing  Summary: Clinician led check-in regarding current stressors and situation. Clinician utilized active listening and empathetic response and validated patient emotions. Clinician facilitated processing group on pertinent issues.   Therapist Response: Devin Ellison is a 66 y.o. male who presents with post-manic and depressive symptoms. Patient arrived within time allowed and reports that he is feeling "real good." Patient rates his mood at a 9 on a scale of 1-10 with 10 being great. Pt reports he got a job and begin next week. Pt reports optimism for his future. Pt able to process. Pt engaged in discussion.         Session Time: 10:00 - 11:00   Participation Level:  Active   Behavioral Response: CasualAlertDepressed   Type of Therapy: Group Therapy   Treatment Goals addressed: Coping   Interventions: CBT, DBT, Supportive and Reframing   Summary: Cln continued topic of DBT distress tolerance skills. Cln introduced STOP skill and group discussed ways they could apply the STOP skill in their every day life.     Therapist Response:  Pt engaged in discussion and is able to discuss how to apply STOP.         Session Time: 11:00 -12:00   Participation Level: Active   Behavioral Response: CasualAlertDepressed   Type of Therapy: Group therapy   Treatment Goals addressed: Coping   Interventions: Supportive   Summary: Spiritual Care group    Therapist Response: Pt engaged in discussion. See chaplain note.    **Pt chose to leave group at 12. Pt denies SI/HI before leaving group.     Suicidal/Homicidal: Nowithout intent/plan  Plan: Pt will discharge from PHP due to meeting treatment goals of decreased manic and depressive symptoms, increased emotion regulation, and increased ability to manage symptoms in a healthy manner. Pt will begin IOP on 10/20 to continue recovery treatment. Pt and provider are aligned with discharge. Pt denies SI/HI at time of discharge.    Diagnosis: Major depressive disorder, single episode, severe ([place]) [F32.2]    1. Major depressive disorder, single episode, severe ([place])   2. Bipolar I disorder, most recent episode (or current) manic ([place])       Lorin Glass, LCSW 09/07/2021

## 2021-09-14 DIAGNOSIS — F3112 Bipolar disorder, current episode manic without psychotic features, moderate: Secondary | ICD-10-CM | POA: Diagnosis not present

## 2021-09-20 DIAGNOSIS — F3112 Bipolar disorder, current episode manic without psychotic features, moderate: Secondary | ICD-10-CM | POA: Diagnosis not present

## 2021-09-21 ENCOUNTER — Other Ambulatory Visit (HOSPITAL_COMMUNITY): Payer: Self-pay | Admitting: Gastroenterology

## 2021-09-21 ENCOUNTER — Other Ambulatory Visit: Payer: Self-pay | Admitting: Gastroenterology

## 2021-09-21 ENCOUNTER — Ambulatory Visit (HOSPITAL_COMMUNITY)
Admission: RE | Admit: 2021-09-21 | Discharge: 2021-09-21 | Disposition: A | Discharge status: Home / Self Care | Payer: Medicare Other | Source: Ambulatory Visit | Attending: Gastroenterology | Admitting: Gastroenterology

## 2021-09-21 ENCOUNTER — Other Ambulatory Visit: Payer: Self-pay

## 2021-09-21 DIAGNOSIS — K8020 Calculus of gallbladder without cholecystitis without obstruction: Secondary | ICD-10-CM | POA: Diagnosis not present

## 2021-09-21 DIAGNOSIS — R1084 Generalized abdominal pain: Secondary | ICD-10-CM

## 2021-09-21 DIAGNOSIS — I700 Atherosclerosis of aorta: Secondary | ICD-10-CM | POA: Diagnosis not present

## 2021-09-21 DIAGNOSIS — Z9889 Other specified postprocedural states: Secondary | ICD-10-CM

## 2021-09-21 DIAGNOSIS — R109 Unspecified abdominal pain: Secondary | ICD-10-CM | POA: Diagnosis not present

## 2021-09-21 LAB — POCT I-STAT CREATININE: Creatinine, Ser: 1.1 mg/dL (ref 0.61–1.24)

## 2021-09-21 MED ORDER — IOHEXOL 300 MG/ML  SOLN
100.0000 mL | Freq: Once | INTRAMUSCULAR | Status: AC | PRN
  Administered 2021-09-21: 100 mL via INTRAVENOUS

## 2021-09-21 MED ORDER — IOHEXOL 9 MG/ML PO SOLN
ORAL | Status: AC
  Administered 2021-09-21: 500 mL
  Filled 2021-09-21: qty 1000

## 2021-09-22 ENCOUNTER — Ambulatory Visit (HOSPITAL_BASED_OUTPATIENT_CLINIC_OR_DEPARTMENT_OTHER): Payer: Medicare Other | Admitting: Orthopaedic Surgery

## 2021-09-24 ENCOUNTER — Ambulatory Visit (INDEPENDENT_AMBULATORY_CARE_PROVIDER_SITE_OTHER): Payer: Medicare Other | Admitting: Orthopaedic Surgery

## 2021-09-24 ENCOUNTER — Ambulatory Visit (HOSPITAL_BASED_OUTPATIENT_CLINIC_OR_DEPARTMENT_OTHER): Payer: Medicare Other | Admitting: Orthopaedic Surgery

## 2021-09-24 ENCOUNTER — Other Ambulatory Visit: Payer: Self-pay

## 2021-09-24 DIAGNOSIS — M545 Low back pain, unspecified: Secondary | ICD-10-CM | POA: Diagnosis not present

## 2021-09-24 DIAGNOSIS — M47818 Spondylosis without myelopathy or radiculopathy, sacral and sacrococcygeal region: Secondary | ICD-10-CM | POA: Diagnosis not present

## 2021-09-24 DIAGNOSIS — G8929 Other chronic pain: Secondary | ICD-10-CM

## 2021-09-24 NOTE — Progress Notes (Signed)
Chief Complaint: Left SI joint pain     History of Present Illness:    Devin Ellison is a 66 y.o. male presents with left lower back pain now going on since 1 November.  He did not have any specific injury or mechanism.  He states that this pain is very focally located he does not radiate.  He gets stiff while sitting for longer periods.  He works at [employer] in [employer].  Train he has not taken any pain medication for this.  He is here today for further evaluation and assessment.    Surgical History:   None  PMH/PSH/Family History/Social History/Meds/Allergies:    Past Medical History:  Diagnosis Date   Bipolar 1 disorder ([place])    in remission   Depression    Diabetes mellitus, type II ([place])    Hypertension    Hypertension    Past Surgical History:  Procedure Laterality Date   ANKLE SURGERY Right    COLON SURGERY     polypectomy   HAND SURGERY Right    after fx   Social History   Socioeconomic History   Marital status: Legally Separated    Spouse name: Not on file   Number of children: 2   Years of education: Not on file   Highest education level: Doctorate  Occupational History   Not on file  Tobacco Use   Smoking status: Former    Packs/day: 1.00    Types: Cigarettes    Quit date: 08/13/2013    Years since quitting: 8.1   Smokeless tobacco: Never  Substance and Sexual Activity   Alcohol use: Yes    Alcohol/week: 0.0 standard drinks    Comment: 6 beers/week   Drug use: No   Sexual activity: Not on file  Other Topics Concern   Not on file  Social History Narrative   Not on file   Social Determinants of Health   Financial Resource Strain: Not on file  Food Insecurity: Not on file  Transportation Needs: Not on file  Physical Activity: Not on file  Stress: Not on file  Social Connections: Not on file   Family History  Problem Relation Age of Onset   Heart disease Mother    Cerebral palsy Sister 36    Leukemia Sister    Breast cancer Sister    Depression Paternal Uncle    No Known Allergies Current Outpatient Medications  Medication Sig Dispense Refill   amLODipine (NORVASC) 5 MG tablet Take 5 mg by mouth daily.     atorvastatin (LIPITOR) 10 MG tablet Take 10 mg by mouth daily.     divalproex (DEPAKOTE ER) 500 MG 24 hr tablet Take 1,000 mg by mouth daily.     lamoTRIgine (LAMICTAL) 200 MG tablet Take 1 tablet (200 mg total) by mouth 2 (two) times daily.     losartan (COZAAR) 100 MG tablet TAKE 1 TABLET BY MOUTH EVERY DAY (Patient taking differently: Take 100 mg by mouth daily.) 30 tablet 0   metFORMIN (GLUCOPHAGE) 1000 MG tablet Take 1,000 mg by mouth daily.     QUEtiapine (SEROQUEL XR) 200 MG 24 hr tablet Take 1 tablet (200 mg total) by mouth at bedtime.     No current facility-administered medications for this visit.   No results found.  Review of  Systems:   A ROS was performed including pertinent positives and negatives as documented in the HPI.  Physical Exam :   Constitutional: NAD and appears stated age Neurological: Alert and oriented Psych: Appropriate affect and cooperative There were no vitals taken for this visit.   Comprehensive Musculoskeletal Exam:    Tenderness to palpation about the left SI joint.  Tenderness with forward flexion.  No weakness in bilateral lower extremities.  Sensation is intact in bilateral lower extremities.  No midline tenderness no paraspinal muscular tenderness.  Imaging:   CT scan abdomen and pelvis: There is evidence of left SI sclerosis and joint space narrowing consistent with SI arthritis  I personally reviewed and interpreted the radiographs.   Assessment:   66 year old male with left SI joint arthritis.  At this time we will plan to refer him to Dr. Ernestina Patches for an SI injection.  I described that given that this has been occurring since November I do believe that there is a high likelihood resolve his pain.  He will see me back on  an as-needed basis should his pain recur.  Plan :    -Plan for referral to Dr. Ernestina Patches for an SI guided injection     I personally saw and evaluated the patient, and participated in the management and treatment plan.  Vanetta Mulders, MD Attending Physician, Orthopedic Surgery  This document was dictated using Dragon voice recognition software. A reasonable attempt at proof reading has been made to minimize errors.

## 2021-09-28 DIAGNOSIS — R197 Diarrhea, unspecified: Secondary | ICD-10-CM | POA: Diagnosis not present

## 2021-10-12 ENCOUNTER — Other Ambulatory Visit: Payer: Self-pay

## 2021-10-12 ENCOUNTER — Encounter: Payer: Self-pay | Admitting: Physical Medicine and Rehabilitation

## 2021-10-12 ENCOUNTER — Ambulatory Visit: Payer: Medicare Other

## 2021-10-12 ENCOUNTER — Ambulatory Visit (INDEPENDENT_AMBULATORY_CARE_PROVIDER_SITE_OTHER): Payer: Medicare Other | Admitting: Physical Medicine and Rehabilitation

## 2021-10-12 DIAGNOSIS — M461 Sacroiliitis, not elsewhere classified: Secondary | ICD-10-CM

## 2021-10-12 NOTE — Progress Notes (Signed)
Pt state left hip pain. Pt state standing and getting in the car makes the pain worse. Pt state he take nothing for the pain.  Numeric Pain Rating Scale and Functional Assessment Average Pain 6   In the last MONTH (on 0-10 scale) has pain interfered with the following?  1. General activity like being  able to carry out your everyday physical activities such as walking, climbing stairs, carrying groceries, or moving a chair?  Rating(9)   -BT, -Dye Allergies.

## 2021-10-13 MED ORDER — METHYLPREDNISOLONE ACETATE 80 MG/ML IJ SUSP
80.0000 mg | INTRAMUSCULAR | Status: AC | PRN
  Administered 2021-10-12: 80 mg via INTRA_ARTICULAR

## 2021-10-13 MED ORDER — BUPIVACAINE HCL 0.5 % IJ SOLN
2.0000 mL | INTRAMUSCULAR | Status: AC | PRN
  Administered 2021-10-12: 2 mL via INTRA_ARTICULAR

## 2021-10-13 NOTE — Progress Notes (Signed)
Devin Ellison - 66 y.o. male MRN [id number]  Date of birth: 1956-02-25  Office Visit Note: Visit Date: 10/12/2021 PCP: London Pepper, MD Referred by: London Pepper, MD  Subjective: Chief Complaint  Patient presents with   Left Hip - Pain   HPI:  [name] is a 66 y.o. male who comes in today at the request of Dr. Vanetta Mulders for planned Left anesthetic sacroiliac joint arthrogram with fluoroscopic guidance.  The patient has failed conservative care including home exercise, medications, time and activity modification.  This injection will be diagnostic and hopefully therapeutic.  Please see requesting physician notes for further details and justification.  CT scan reviewed does show sclerosis of the sacroiliac joints.  ROS Otherwise per HPI.  Assessment & Plan: Visit Diagnoses:    ICD-10-CM   1. Sacroiliitis ([place])  M46.1 XR C-ARM NO REPORT      Plan: No additional findings.   Meds & Orders: No orders of the defined types were placed in this encounter.   Orders Placed This Encounter  Procedures   Sacroiliac Joint Inj   XR C-ARM NO REPORT    Follow-up: Return for visit to requesting provider as needed.   Procedures: Sacroiliac Joint Inj on 10/12/2021 3:00 PM Indications: pain and diagnostic evaluation Details: 22 G 3.5 in needle, fluoroscopy-guided posterior approach Medications: 2 mL bupivacaine 0.5 %; 80 mg methylPREDNISolone acetate 80 MG/ML Outcome: tolerated well, no immediate complications  Sacroiliac Joint Intra-Articular Injection - Posterior Approach with Fluoroscopic Guidance   Position: PRONE  Additional Comments: Vital signs were monitored before and after the procedure. Patient was prepped and draped in the usual sterile fashion. The correct patient, procedure, and site was verified.   Injection Procedure Details:   Location/Site:  Sacroiliac joint  Needle size: 3.5 in Spinal Needle  Needle type: Spinal  Needle Placement:  Intra-articular  Findings:  -Comments: There was excellent flow of contrast producing a partial arthrogram of the sacroiliac joint.   Procedure Details: Starting with a 90 degree vertical and midline orientation the fluoroscope was tilted cranially 20 to 25 degrees and the target area of the inferior most part of the SI joint on the side mentioned above was visualized.  The soft tissues overlying this target were infiltrated with 4 ml. of 1% Lidocaine without Epinephrine. A #22 gauge spinal needle was inserted perpendicular to the fluoroscope table and advanced into the posterior inferior joint space using fluoroscopic guidance.  Position in the joint space was confirmed by obtaining a partial arthrogram using a 2 ml. volume of Isovue-250 contrast agent. After negative aspirate for gross pus or blood, the injectate was delivered to the joint. Radiographs were obtained for documentation purposes.   Additional Comments:   Dressing: Bandaid    Post-procedure details: Patient was observed during the procedure. Post-procedure instructions were reviewed.  Patient left the clinic in stable condition.    There was excellent flow of contrast producing a partial arthrogram of the sacroiliac joint.  Procedure, treatment alternatives, risks and benefits explained, specific risks discussed. Consent was given by the patient. Immediately prior to procedure a time out was called to verify the correct patient, procedure, equipment, support staff and site/side marked as required. Patient was prepped and draped in the usual sterile fashion.         Clinical History: No specialty comments available.     Objective:  VS:  HT:     WT:    BMI:      BP:  HR: bpm   TEMP: ( )   RESP:  Physical Exam   Imaging: XR C-ARM NO REPORT  Result Date: 10/12/2021 Please see Notes tab for imaging impression.

## 2021-10-15 DIAGNOSIS — F319 Bipolar disorder, unspecified: Secondary | ICD-10-CM | POA: Diagnosis not present

## 2021-10-15 DIAGNOSIS — I10 Essential (primary) hypertension: Secondary | ICD-10-CM | POA: Diagnosis not present

## 2021-10-15 DIAGNOSIS — E1169 Type 2 diabetes mellitus with other specified complication: Secondary | ICD-10-CM | POA: Diagnosis not present

## 2021-10-15 DIAGNOSIS — Z7984 Long term (current) use of oral hypoglycemic drugs: Secondary | ICD-10-CM | POA: Diagnosis not present

## 2021-10-15 DIAGNOSIS — D649 Anemia, unspecified: Secondary | ICD-10-CM | POA: Diagnosis not present

## 2021-10-25 ENCOUNTER — Ambulatory Visit: Payer: Self-pay | Admitting: Surgery

## 2021-10-25 DIAGNOSIS — E119 Type 2 diabetes mellitus without complications: Secondary | ICD-10-CM | POA: Diagnosis not present

## 2021-10-25 DIAGNOSIS — Z86010 Personal history of colonic polyps: Secondary | ICD-10-CM | POA: Diagnosis not present

## 2021-10-25 DIAGNOSIS — D126 Benign neoplasm of colon, unspecified: Secondary | ICD-10-CM | POA: Diagnosis not present

## 2021-10-25 DIAGNOSIS — R739 Hyperglycemia, unspecified: Secondary | ICD-10-CM

## 2021-10-25 DIAGNOSIS — F17210 Nicotine dependence, cigarettes, uncomplicated: Secondary | ICD-10-CM | POA: Diagnosis not present

## 2021-10-29 DIAGNOSIS — E1169 Type 2 diabetes mellitus with other specified complication: Secondary | ICD-10-CM | POA: Diagnosis not present

## 2021-10-29 DIAGNOSIS — D649 Anemia, unspecified: Secondary | ICD-10-CM | POA: Diagnosis not present

## 2021-11-05 DIAGNOSIS — H3552 Pigmentary retinal dystrophy: Secondary | ICD-10-CM | POA: Diagnosis not present

## 2021-11-05 DIAGNOSIS — H2513 Age-related nuclear cataract, bilateral: Secondary | ICD-10-CM | POA: Diagnosis not present

## 2021-11-05 DIAGNOSIS — E119 Type 2 diabetes mellitus without complications: Secondary | ICD-10-CM | POA: Diagnosis not present

## 2021-11-18 NOTE — Progress Notes (Addendum)
COVID Vaccine Completed: yes x2 ?Date COVID Vaccine completed: 11/03/19, 12/03/19 ?Has received booster: ?COVID vaccine manufacturer: Pfizer     ? ?Date of COVID positive in last 90 days: no ? ?PCP - London Pepper, MD ?Cardiologist - n/a ? ?Chest x-ray - n/a ?EKG - 05/12/21 Epic ?Stress Test - n/a ?ECHO - n/a ?Cardiac Cath - n/a ?Pacemaker/ICD device last checked: n/a ?Spinal Cord Stimulator: n/a ? ?Bowel Prep - liquids day before, Miralax, antibiotics, dulcolax. Patient has instructions and medications ? ?Sleep Study - yes, positive ?CPAP - no ? ?Fasting Blood Sugar - pre DM, no checks at home ?Checks Blood Sugar __ times a day ? ?Blood Thinner Instructions: n/a ?Aspirin Instructions: ?Last Dose: ? ?Activity level: Can go up a flight of stairs and perform activities of daily living without stopping and without symptoms of chest pain or shortness of breath.   ? ?Anesthesia review:  ? ?Patient denies shortness of breath, fever, cough and chest pain at PAT appointment ? ? ?Patient verbalized understanding of instructions that were given to them at the PAT appointment. Patient was also instructed that they will need to review over the PAT instructions again at home before surgery.  ?

## 2021-11-18 NOTE — Patient Instructions (Addendum)
DUE TO COVID-19 ONLY ONE VISITOR  (aged 68 and older)  IS ALLOWED TO COME WITH YOU AND STAY IN THE WAITING ROOM ONLY DURING PRE OP AND PROCEDURE.   ?**NO VISITORS ARE ALLOWED IN THE SHORT STAY AREA OR RECOVERY ROOM!!** ? ?IF YOU WILL BE ADMITTED INTO THE HOSPITAL YOU ARE ALLOWED ONLY TWO SUPPORT PEOPLE DURING VISITATION HOURS ONLY (7 AM -8PM)   ?The support person(s) must pass our screening, gel in and out, and wear a mask at all times, including in the patient?s room. ?Patients must also wear a mask when staff or their support person are in the room. ?Visitors GUEST BADGE MUST BE WORN VISIBLY  ?One adult visitor may remain with you overnight and MUST be in the room by 8 P.M. ?  ? ? Your procedure is scheduled on: 12/01/21 ? ? Report to [facility] Main Entrance ? ?  Report to admitting at 10:45 AM ? ? Call this number if you have problems the morning of surgery [phone] ? ? You may have the following liquids until 10:00 AM DAY OF SURGERY ? ?Water ?Black Coffee (sugar ok, NO MILK/CREAM OR CREAMERS)  ?Tea (sugar ok, NO MILK/CREAM OR CREAMERS) regular and decaf                             ?Plain Jell-O (NO RED)                                           ?Fruit ices (not with fruit pulp, NO RED)                                     ?Popsicles (NO RED)                                                                  ?Juice: apple, WHITE grape, WHITE cranberry ?Sports drinks like Gatorade (NO RED) ?Clear broth(vegetable,chicken,beef) ? ?             ?Drink 2 G2 drinks AT 10:00 PM the night before surgery.   ? ?  ?  ?The day of surgery:  ?Drink ONE (1) Pre-Surgery G2 at 10:00 AM the morning of surgery. Drink in one sitting. Do not sip.  ?This drink was given to you during your hospital  ?pre-op appointment visit. ?Nothing else to drink after completing the  ?Pre-Surgery G2. ?  ?       If you have questions, please contact your surgeon?s office. ? ? ?FOLLOW BOWEL PREP AND ANY ADDITIONAL PRE OP INSTRUCTIONS YOU  RECEIVED FROM YOUR SURGEON'S OFFICE!!! ?  ?  ?Oral Hygiene is also important to reduce your risk of infection.                                    ?Remember - BRUSH YOUR TEETH THE MORNING OF SURGERY WITH YOUR REGULAR TOOTHPASTE ? ? Do NOT smoke after Midnight ? ? Take these  medicines the morning of surgery with A SIP OF WATER: Amlodipine, Atorvastatin, Depakote, Lamictal ? ?DO NOT TAKE ANY ORAL DIABETIC MEDICATIONS DAY OF YOUR SURGERY ? ?How to Manage Your Diabetes ?Before and After Surgery ? ?Why is it important to control my blood sugar before and after surgery? ?Improving blood sugar levels before and after surgery helps healing and can limit problems. ?A way of improving blood sugar control is eating a healthy diet by: ? Eating less sugar and carbohydrates ? Increasing activity/exercise ? Talking with your doctor about reaching your blood sugar goals ?High blood sugars (greater than 180 mg/dL) can raise your risk of infections and slow your recovery, so you will need to focus on controlling your diabetes during the weeks before surgery. ?Make sure that the doctor who takes care of your diabetes knows about your planned surgery including the date and location. ? ?How do I manage my blood sugar before surgery? ?Check your blood sugar at least 4 times a day, starting 2 days before surgery, to make sure that the level is not too high or low. ?Check your blood sugar the morning of your surgery when you wake up and every 2 hours until you get to the Short Stay unit. ?If your blood sugar is less than 70 mg/dL, you will need to treat for low blood sugar: ?Do not take insulin. ?Treat a low blood sugar (less than 70 mg/dL) with ? cup of clear juice (cranberry or apple), 4 glucose tablets, OR glucose gel. ?Recheck blood sugar in 15 minutes after treatment (to make sure it is greater than 70 mg/dL). If your blood sugar is not greater than 70 mg/dL on recheck, call [phone] for further instructions. ?Report your blood  sugar to the short stay nurse when you get to Short Stay. ? ?If you are admitted to the hospital after surgery: ?Your blood sugar will be checked by the staff and you will probably be given insulin after surgery (instead of oral diabetes medicines) to make sure you have good blood sugar levels. ?The goal for blood sugar control after surgery is 80-180 mg/dL. ? ? ?WHAT DO I DO ABOUT MY DIABETES MEDICATION? ? ?Do not take oral diabetes medicines (pills) the morning of surgery. ? ?THE DAY BEFORE SURGERY, take Metformin as prescribed.     ? ? ?THE MORNING OF SURGERY, do not take Metformin. ? ?Reviewed and Endorsed by [facility] Patient Education Committee, August 2015  ?                  ?           You may not have any metal on your body including jewelry, and body piercing ? ?           Do not wear lotions, powders, cologne, or deodorant  ? ?            Men may shave face and neck. ? ? Do not bring valuables to the hospital. [facility] NOT ?            RESPONSIBLE   FOR VALUABLES. ? ? Bring small overnight bag day of surgery. ? ? Special Instructions: Bring a copy of your healthcare power of attorney and living will documents         the day of surgery if you haven't scanned them before. ? ?            Please read over the following fact sheets you were given: IF  YOU HAVE QUESTIONS ABOUT YOUR PRE-OP INSTRUCTIONS PLEASE CALL [phone]- Apolonio Schneiders ? ?   [place] - Preparing for Surgery ?Before surgery, you can play an important role.  Because skin is not sterile, your skin needs to be as free of germs as possible.  You can reduce the number of germs on your skin by washing with CHG (chlorahexidine gluconate) soap before surgery.  CHG is an antiseptic cleaner which kills germs and bonds with the skin to continue killing germs even after washing. ?Please DO NOT use if you have an allergy to CHG or antibacterial soaps.  If your skin becomes reddened/irritated stop using the CHG and inform your nurse when you arrive  at Short Stay. ?Do not shave (including legs and underarms) for at least 48 hours prior to the first CHG shower.  You may shave your face/neck. ? ?Please follow these instructions carefully: ? 1.  Shower with CHG Soap the night before surgery and the  morning of surgery. ? 2.  If you choose to wash your hair, wash your hair first as usual with your normal  shampoo. ? 3.  After you shampoo, rinse your hair and body thoroughly to remove the shampoo.                            ? 4.  Use CHG as you would any other liquid soap.  You can apply chg directly to the skin and wash.  Gently with a scrungie or clean washcloth. ? 5.  Apply the CHG Soap to your body ONLY FROM THE NECK DOWN.   Do   not use on face/ open      ?                     Wound or open sores. Avoid contact with eyes, ears mouth and   genitals (private parts).  ?                     Production manager,  Genitals (private parts) with your normal soap. ?            6.  Wash thoroughly, paying special attention to the area where your    surgery  will be performed. ? 7.  Thoroughly rinse your body with warm water from the neck down. ? 8.  DO NOT shower/wash with your normal soap after using and rinsing off the CHG Soap. ?               9.  Pat yourself dry with a clean towel. ?           10.  Wear clean pajamas. ?           11.  Place clean sheets on your bed the night of your first shower and do not  sleep with pets. ?Day of Surgery : ?Do not apply any lotions/deodorants the morning of surgery.  Please wear clean clothes to the hospital/surgery center. ? ?FAILURE TO FOLLOW THESE INSTRUCTIONS MAY RESULT IN THE CANCELLATION OF YOUR SURGERY ? ?PATIENT SIGNATURE_________________________________ ? ?NURSE SIGNATURE__________________________________ ? ?________________________________________________________________________  ? ?Incentive Spirometer ? ?An incentive spirometer is a tool that can help keep your lungs clear and active. This tool measures how well you are filling  your lungs with each breath. Taking long deep breaths may help reverse or decrease the chance of developing breathing (pulmonary) problems (especially infection) following: ?A long period of  time when you

## 2021-11-19 ENCOUNTER — Encounter (HOSPITAL_COMMUNITY): Payer: Self-pay

## 2021-11-19 ENCOUNTER — Other Ambulatory Visit: Payer: Self-pay

## 2021-11-19 ENCOUNTER — Encounter (HOSPITAL_COMMUNITY)
Admission: RE | Admit: 2021-11-19 | Discharge: 2021-11-19 | Disposition: A | Discharge status: Home / Self Care | Payer: Medicare Other | Source: Ambulatory Visit | Attending: Surgery | Admitting: Surgery

## 2021-11-19 VITALS — BP 157/91 | HR 73 | Temp 98.3°F | Resp 16 | Ht 72.0 in | Wt 222.2 lb

## 2021-11-19 DIAGNOSIS — I10 Essential (primary) hypertension: Secondary | ICD-10-CM | POA: Insufficient documentation

## 2021-11-19 DIAGNOSIS — Z01812 Encounter for preprocedural laboratory examination: Secondary | ICD-10-CM | POA: Diagnosis not present

## 2021-11-19 DIAGNOSIS — R739 Hyperglycemia, unspecified: Secondary | ICD-10-CM | POA: Diagnosis not present

## 2021-11-19 LAB — TYPE AND SCREEN
ABO/RH(D): A POS
Antibody Screen: NEGATIVE

## 2021-11-19 LAB — BASIC METABOLIC PANEL
Anion gap: 7 (ref 5–15)
BUN: 16 mg/dL (ref 8–23)
CO2: 25 mmol/L (ref 22–32)
Calcium: 8.7 mg/dL — ABNORMAL LOW (ref 8.9–10.3)
Chloride: 103 mmol/L (ref 98–111)
Creatinine, Ser: 0.82 mg/dL (ref 0.61–1.24)
GFR, Estimated: >60 mL/min (ref >60)
Glucose, Bld: 117 mg/dL — ABNORMAL HIGH (ref 70–99)
Potassium: 3.9 mmol/L (ref 3.5–5.1)
Sodium: 135 mmol/L (ref 135–145)

## 2021-11-19 LAB — CBC
HCT: 42.1 % (ref 39.0–52.0)
Hemoglobin: 14 g/dL (ref 13.0–17.0)
MCH: 31.5 pg (ref 26.0–34.0)
MCHC: 33.3 g/dL (ref 30.0–36.0)
MCV: 94.8 fL (ref 80.0–100.0)
Platelets: 267 10*3/uL (ref 150–400)
RBC: 4.44 MIL/uL (ref 4.22–5.81)
RDW: 13.5 % (ref 11.5–15.5)
WBC: 6.4 10*3/uL (ref 4.0–10.5)
nRBC: 0 % (ref 0.0–0.2)

## 2021-11-19 LAB — GLUCOSE, CAPILLARY: Glucose-Capillary: 137 mg/dL — ABNORMAL HIGH (ref 70–99)

## 2021-11-19 LAB — HEMOGLOBIN A1C
Hgb A1c MFr Bld: 6.7 % — ABNORMAL HIGH (ref 4.8–5.6)
Mean Plasma Glucose: 145.59 mg/dL

## 2021-12-01 ENCOUNTER — Encounter (HOSPITAL_COMMUNITY)
Admission: RE | Disposition: A | Discharge status: Home / Self Care | Payer: Self-pay | Source: Ambulatory Visit | Attending: Surgery

## 2021-12-01 ENCOUNTER — Inpatient Hospital Stay (HOSPITAL_COMMUNITY): Payer: Medicare Other | Admitting: Anesthesiology

## 2021-12-01 ENCOUNTER — Inpatient Hospital Stay (HOSPITAL_COMMUNITY)
Admission: RE | Admit: 2021-12-01 | Discharge: 2021-12-03 | DRG: 330 | Disposition: A | Discharge status: Home / Self Care | Payer: Medicare Other | Source: Ambulatory Visit | Attending: Surgery | Admitting: Surgery

## 2021-12-01 ENCOUNTER — Other Ambulatory Visit: Payer: Self-pay

## 2021-12-01 ENCOUNTER — Encounter (HOSPITAL_COMMUNITY): Payer: Self-pay | Admitting: Surgery

## 2021-12-01 DIAGNOSIS — D120 Benign neoplasm of cecum: Secondary | ICD-10-CM | POA: Diagnosis not present

## 2021-12-01 DIAGNOSIS — K660 Peritoneal adhesions (postprocedural) (postinfection): Secondary | ICD-10-CM | POA: Diagnosis present

## 2021-12-01 DIAGNOSIS — D126 Benign neoplasm of colon, unspecified: Secondary | ICD-10-CM | POA: Diagnosis present

## 2021-12-01 DIAGNOSIS — F17210 Nicotine dependence, cigarettes, uncomplicated: Secondary | ICD-10-CM | POA: Diagnosis present

## 2021-12-01 DIAGNOSIS — Z91199 Patient's noncompliance with other medical treatment and regimen due to unspecified reason: Secondary | ICD-10-CM | POA: Diagnosis not present

## 2021-12-01 DIAGNOSIS — Z7984 Long term (current) use of oral hypoglycemic drugs: Secondary | ICD-10-CM | POA: Diagnosis not present

## 2021-12-01 DIAGNOSIS — K635 Polyp of colon: Principal | ICD-10-CM | POA: Diagnosis present

## 2021-12-01 DIAGNOSIS — Z9049 Acquired absence of other specified parts of digestive tract: Secondary | ICD-10-CM

## 2021-12-01 DIAGNOSIS — Z8249 Family history of ischemic heart disease and other diseases of the circulatory system: Secondary | ICD-10-CM

## 2021-12-01 DIAGNOSIS — Q438 Other specified congenital malformations of intestine: Secondary | ICD-10-CM

## 2021-12-01 DIAGNOSIS — Z79899 Other long term (current) drug therapy: Secondary | ICD-10-CM

## 2021-12-01 DIAGNOSIS — E119 Type 2 diabetes mellitus without complications: Secondary | ICD-10-CM | POA: Diagnosis present

## 2021-12-01 DIAGNOSIS — F329 Major depressive disorder, single episode, unspecified: Secondary | ICD-10-CM | POA: Diagnosis present

## 2021-12-01 DIAGNOSIS — G4730 Sleep apnea, unspecified: Secondary | ICD-10-CM | POA: Diagnosis present

## 2021-12-01 DIAGNOSIS — F319 Bipolar disorder, unspecified: Secondary | ICD-10-CM | POA: Diagnosis present

## 2021-12-01 DIAGNOSIS — K6389 Other specified diseases of intestine: Secondary | ICD-10-CM | POA: Diagnosis not present

## 2021-12-01 DIAGNOSIS — E785 Hyperlipidemia, unspecified: Secondary | ICD-10-CM | POA: Diagnosis present

## 2021-12-01 DIAGNOSIS — I10 Essential (primary) hypertension: Secondary | ICD-10-CM | POA: Diagnosis present

## 2021-12-01 DIAGNOSIS — F3110 Bipolar disorder, current episode manic without psychotic features, unspecified: Secondary | ICD-10-CM | POA: Diagnosis present

## 2021-12-01 DIAGNOSIS — Z86010 Personal history of colonic polyps: Secondary | ICD-10-CM | POA: Diagnosis not present

## 2021-12-01 LAB — GLUCOSE, CAPILLARY
Glucose-Capillary: 133 mg/dL — ABNORMAL HIGH (ref 70–99)
Glucose-Capillary: 181 mg/dL — ABNORMAL HIGH (ref 70–99)
Glucose-Capillary: 223 mg/dL — ABNORMAL HIGH (ref 70–99)

## 2021-12-01 SURGERY — COLECTOMY, PARTIAL, ROBOT-ASSISTED, LAPAROSCOPIC
Anesthesia: General | Site: Abdomen

## 2021-12-01 MED ORDER — ENSURE PRE-SURGERY PO LIQD: 592.0000 mL | Freq: Once | ORAL | Status: DC

## 2021-12-01 MED ORDER — MIDAZOLAM HCL 5 MG/5ML IJ SOLN
INTRAMUSCULAR | Status: DC | PRN
  Administered 2021-12-01: 2 mg via INTRAVENOUS

## 2021-12-01 MED ORDER — ALVIMOPAN 12 MG PO CAPS
12.0000 mg | ORAL_CAPSULE | ORAL | Status: AC
  Administered 2021-12-01: 12 mg via ORAL
  Filled 2021-12-01: qty 1

## 2021-12-01 MED ORDER — CHLORHEXIDINE GLUCONATE 0.12 % MT SOLN
15.0000 mL | Freq: Once | OROMUCOSAL | Status: AC
  Administered 2021-12-01: 15 mL via OROMUCOSAL

## 2021-12-01 MED ORDER — LACTATED RINGERS IR SOLN
Status: DC | PRN
  Administered 2021-12-01: 1000 mL

## 2021-12-01 MED ORDER — POLYETHYLENE GLYCOL 3350 17 GM/SCOOP PO POWD: 1.0000 | Freq: Once | ORAL | Status: DC

## 2021-12-01 MED ORDER — BISACODYL 5 MG PO TBEC: 20.0000 mg | DELAYED_RELEASE_TABLET | Freq: Once | ORAL | Status: DC

## 2021-12-01 MED ORDER — ONDANSETRON HCL 4 MG/2ML IJ SOLN
INTRAMUSCULAR | Status: DC | PRN
  Administered 2021-12-01: 4 mg via INTRAVENOUS

## 2021-12-01 MED ORDER — ENSURE PRE-SURGERY PO LIQD: 296.0000 mL | Freq: Once | ORAL | Status: DC

## 2021-12-01 MED ORDER — ENOXAPARIN SODIUM 40 MG/0.4ML IJ SOSY
40.0000 mg | PREFILLED_SYRINGE | Freq: Once | INTRAMUSCULAR | Status: AC
  Administered 2021-12-01: 40 mg via SUBCUTANEOUS
  Filled 2021-12-01: qty 0.4

## 2021-12-01 MED ORDER — DIVALPROEX SODIUM ER 500 MG PO TB24
2000.0000 mg | ORAL_TABLET | Freq: Every day | ORAL | Status: DC
  Administered 2021-12-02: 2000 mg via ORAL
  Filled 2021-12-01 (×2): qty 4

## 2021-12-01 MED ORDER — ATORVASTATIN CALCIUM 10 MG PO TABS
10.0000 mg | ORAL_TABLET | Freq: Every day | ORAL | Status: DC
  Administered 2021-12-02: 10 mg via ORAL
  Filled 2021-12-01: qty 1

## 2021-12-01 MED ORDER — BUPIVACAINE LIPOSOME 1.3 % IJ SUSP
INTRAMUSCULAR | Status: DC | PRN
  Administered 2021-12-01: 20 mL

## 2021-12-01 MED ORDER — FENTANYL CITRATE (PF) 100 MCG/2ML IJ SOLN
INTRAMUSCULAR | Status: AC
  Filled 2021-12-01: qty 2

## 2021-12-01 MED ORDER — INSULIN ASPART 100 UNIT/ML IJ SOLN
0.0000 [IU] | Freq: Every day | INTRAMUSCULAR | Status: DC
  Administered 2021-12-01: 2 [IU] via SUBCUTANEOUS

## 2021-12-01 MED ORDER — HYDROMORPHONE HCL 1 MG/ML IJ SOLN: 0.5000 mg | INTRAMUSCULAR | Status: DC | PRN

## 2021-12-01 MED ORDER — BUPIVACAINE-EPINEPHRINE (PF) 0.25% -1:200000 IJ SOLN
INTRAMUSCULAR | Status: DC | PRN
Start: 2021-12-01 — End: 2021-12-01
  Administered 2021-12-01: 60 mL

## 2021-12-01 MED ORDER — ONDANSETRON HCL 4 MG/2ML IJ SOLN: 4.0000 mg | Freq: Once | INTRAMUSCULAR | Status: DC | PRN

## 2021-12-01 MED ORDER — BUPIVACAINE LIPOSOME 1.3 % IJ SUSP: 20.0000 mL | Freq: Once | INTRAMUSCULAR | Status: DC

## 2021-12-01 MED ORDER — OXYCODONE HCL 5 MG/5ML PO SOLN: 5.0000 mg | Freq: Once | ORAL | Status: AC | PRN

## 2021-12-01 MED ORDER — LURASIDONE HCL 20 MG PO TABS
20.0000 mg | ORAL_TABLET | Freq: Every evening | ORAL | Status: DC
  Administered 2021-12-01 – 2021-12-02 (×2): 20 mg via ORAL
  Filled 2021-12-01 (×2): qty 1

## 2021-12-01 MED ORDER — LACTATED RINGERS IV SOLN: INTRAVENOUS | Status: DC

## 2021-12-01 MED ORDER — EPHEDRINE 5 MG/ML INJ
INTRAVENOUS | Status: AC
  Filled 2021-12-01: qty 5

## 2021-12-01 MED ORDER — EPHEDRINE SULFATE-NACL 50-0.9 MG/10ML-% IV SOSY
PREFILLED_SYRINGE | INTRAVENOUS | Status: DC | PRN
  Administered 2021-12-01: 5 mg via INTRAVENOUS

## 2021-12-01 MED ORDER — SUGAMMADEX SODIUM 200 MG/2ML IV SOLN
INTRAVENOUS | Status: DC | PRN
Start: 2021-12-01 — End: 2021-12-01
  Administered 2021-12-01: 200 mg via INTRAVENOUS

## 2021-12-01 MED ORDER — TRAMADOL HCL 50 MG PO TABS
ORAL_TABLET | ORAL | Status: AC
  Filled 2021-12-01: qty 1

## 2021-12-01 MED ORDER — TRAMADOL HCL 50 MG PO TABS
50.0000 mg | ORAL_TABLET | Freq: Four times a day (QID) | ORAL | Status: DC | PRN
  Administered 2021-12-01 – 2021-12-03 (×4): 50 mg via ORAL
  Filled 2021-12-01 (×3): qty 1

## 2021-12-01 MED ORDER — DEXMEDETOMIDINE (PRECEDEX) IN NS 20 MCG/5ML (4 MCG/ML) IV SYRINGE
PREFILLED_SYRINGE | INTRAVENOUS | Status: DC | PRN
  Administered 2021-12-01: 8 ug via INTRAVENOUS

## 2021-12-01 MED ORDER — ONDANSETRON HCL 4 MG/2ML IJ SOLN
INTRAMUSCULAR | Status: AC
  Filled 2021-12-01: qty 2

## 2021-12-01 MED ORDER — HYDROMORPHONE HCL 1 MG/ML IJ SOLN
INTRAMUSCULAR | Status: AC
  Administered 2021-12-01: 0.5 mg via INTRAVENOUS
  Filled 2021-12-01: qty 2

## 2021-12-01 MED ORDER — MIDAZOLAM HCL 2 MG/2ML IJ SOLN
INTRAMUSCULAR | Status: AC
Start: 2021-12-01 — End: ?
  Filled 2021-12-01: qty 2

## 2021-12-01 MED ORDER — PROPOFOL 10 MG/ML IV BOLUS
INTRAVENOUS | Status: DC | PRN
  Administered 2021-12-01: 200 mg via INTRAVENOUS

## 2021-12-01 MED ORDER — DIPHENHYDRAMINE HCL 12.5 MG/5ML PO ELIX: 12.5000 mg | ORAL_SOLUTION | Freq: Four times a day (QID) | ORAL | Status: DC | PRN

## 2021-12-01 MED ORDER — ACETAMINOPHEN 500 MG PO TABS
1000.0000 mg | ORAL_TABLET | Freq: Four times a day (QID) | ORAL | Status: DC
  Administered 2021-12-01 – 2021-12-03 (×7): 1000 mg via ORAL
  Filled 2021-12-01 (×6): qty 2

## 2021-12-01 MED ORDER — DIPHENHYDRAMINE HCL 50 MG/ML IJ SOLN: 12.5000 mg | Freq: Four times a day (QID) | INTRAMUSCULAR | Status: DC | PRN

## 2021-12-01 MED ORDER — LIDOCAINE 2% (20 MG/ML) 5 ML SYRINGE
INTRAMUSCULAR | Status: DC | PRN
  Administered 2021-12-01: 1.5 mg/kg/h via INTRAVENOUS

## 2021-12-01 MED ORDER — NEOMYCIN SULFATE 500 MG PO TABS: 1000.0000 mg | ORAL_TABLET | ORAL | Status: DC

## 2021-12-01 MED ORDER — FENTANYL CITRATE (PF) 100 MCG/2ML IJ SOLN
INTRAMUSCULAR | Status: DC | PRN
Start: 2021-12-01 — End: 2021-12-01
  Administered 2021-12-01 (×3): 50 ug via INTRAVENOUS
  Administered 2021-12-01: 100 ug via INTRAVENOUS
  Administered 2021-12-01 (×2): 50 ug via INTRAVENOUS

## 2021-12-01 MED ORDER — LACTATED RINGERS IV SOLN: INTRAVENOUS | Status: DC | PRN

## 2021-12-01 MED ORDER — ACETAMINOPHEN 500 MG PO TABS
1000.0000 mg | ORAL_TABLET | ORAL | Status: AC
  Administered 2021-12-01: 1000 mg via ORAL
  Filled 2021-12-01: qty 2

## 2021-12-01 MED ORDER — ONDANSETRON HCL 4 MG PO TABS: 4.0000 mg | ORAL_TABLET | Freq: Four times a day (QID) | ORAL | Status: DC | PRN

## 2021-12-01 MED ORDER — OXYCODONE HCL 5 MG PO TABS
ORAL_TABLET | ORAL | Status: AC
  Filled 2021-12-01: qty 1

## 2021-12-01 MED ORDER — TRAMADOL HCL 50 MG PO TABS: 50.0000 mg | ORAL_TABLET | Freq: Four times a day (QID) | ORAL | 0 refills | Status: DC | PRN

## 2021-12-01 MED ORDER — GABAPENTIN 300 MG PO CAPS
300.0000 mg | ORAL_CAPSULE | ORAL | Status: AC
  Administered 2021-12-01: 300 mg via ORAL
  Filled 2021-12-01: qty 1

## 2021-12-01 MED ORDER — HYDRALAZINE HCL 20 MG/ML IJ SOLN: 10.0000 mg | INTRAMUSCULAR | Status: DC | PRN

## 2021-12-01 MED ORDER — INSULIN ASPART 100 UNIT/ML IJ SOLN
0.0000 [IU] | Freq: Three times a day (TID) | INTRAMUSCULAR | Status: DC
  Administered 2021-12-02 (×3): 2 [IU] via SUBCUTANEOUS
  Administered 2021-12-03: 3 [IU] via SUBCUTANEOUS

## 2021-12-01 MED ORDER — OXYCODONE HCL 5 MG PO TABS
5.0000 mg | ORAL_TABLET | Freq: Once | ORAL | Status: AC | PRN
  Administered 2021-12-01: 5 mg via ORAL

## 2021-12-01 MED ORDER — METFORMIN HCL 500 MG PO TABS
1000.0000 mg | ORAL_TABLET | Freq: Two times a day (BID) | ORAL | Status: DC
  Administered 2021-12-02 – 2021-12-03 (×3): 1000 mg via ORAL
  Filled 2021-12-01 (×3): qty 2

## 2021-12-01 MED ORDER — ALVIMOPAN 12 MG PO CAPS
12.0000 mg | ORAL_CAPSULE | Freq: Two times a day (BID) | ORAL | Status: DC
  Administered 2021-12-02 (×2): 12 mg via ORAL
  Filled 2021-12-01 (×2): qty 1

## 2021-12-01 MED ORDER — SIMETHICONE 80 MG PO CHEW
40.0000 mg | CHEWABLE_TABLET | Freq: Four times a day (QID) | ORAL | Status: DC | PRN
  Administered 2021-12-02: 40 mg via ORAL
  Filled 2021-12-01: qty 1

## 2021-12-01 MED ORDER — HYDROMORPHONE HCL 1 MG/ML IJ SOLN
0.2500 mg | INTRAMUSCULAR | Status: DC | PRN
  Administered 2021-12-01 (×3): 0.5 mg via INTRAVENOUS

## 2021-12-01 MED ORDER — ACETAMINOPHEN 500 MG PO TABS
ORAL_TABLET | ORAL | Status: AC
  Filled 2021-12-01: qty 2

## 2021-12-01 MED ORDER — PHENYLEPHRINE 40 MCG/ML (10ML) SYRINGE FOR IV PUSH (FOR BLOOD PRESSURE SUPPORT)
PREFILLED_SYRINGE | INTRAVENOUS | Status: DC | PRN
  Administered 2021-12-01: 80 ug via INTRAVENOUS
  Administered 2021-12-01: 120 ug via INTRAVENOUS
  Administered 2021-12-01: 80 ug via INTRAVENOUS

## 2021-12-01 MED ORDER — 0.9 % SODIUM CHLORIDE (POUR BTL) OPTIME
TOPICAL | Status: DC | PRN
  Administered 2021-12-01: 2000 mL

## 2021-12-01 MED ORDER — ROCURONIUM BROMIDE 10 MG/ML (PF) SYRINGE
PREFILLED_SYRINGE | INTRAVENOUS | Status: DC | PRN
  Administered 2021-12-01: 100 mg via INTRAVENOUS
  Administered 2021-12-01: 30 mg via INTRAVENOUS

## 2021-12-01 MED ORDER — SODIUM CHLORIDE 0.9 % IV SOLN
2.0000 g | INTRAVENOUS | Status: AC
  Administered 2021-12-01: 2 g via INTRAVENOUS
  Filled 2021-12-01: qty 2

## 2021-12-01 MED ORDER — ROCURONIUM BROMIDE 10 MG/ML (PF) SYRINGE
PREFILLED_SYRINGE | INTRAVENOUS | Status: AC
  Filled 2021-12-01: qty 40

## 2021-12-01 MED ORDER — ENSURE SURGERY PO LIQD
237.0000 mL | Freq: Two times a day (BID) | ORAL | Status: DC
  Administered 2021-12-02: 237 mL via ORAL

## 2021-12-01 MED ORDER — IBUPROFEN 400 MG PO TABS
600.0000 mg | ORAL_TABLET | Freq: Four times a day (QID) | ORAL | Status: DC | PRN
  Administered 2021-12-01 – 2021-12-02 (×2): 600 mg via ORAL
  Filled 2021-12-01 (×2): qty 1

## 2021-12-01 MED ORDER — DEXAMETHASONE SODIUM PHOSPHATE 10 MG/ML IJ SOLN
INTRAMUSCULAR | Status: DC | PRN
  Administered 2021-12-01: 10 mg via INTRAVENOUS

## 2021-12-01 MED ORDER — LOSARTAN POTASSIUM 50 MG PO TABS
100.0000 mg | ORAL_TABLET | Freq: Every day | ORAL | Status: DC
  Administered 2021-12-02: 100 mg via ORAL
  Filled 2021-12-01: qty 2

## 2021-12-01 MED ORDER — BUPIVACAINE LIPOSOME 1.3 % IJ SUSP
INTRAMUSCULAR | Status: AC
  Filled 2021-12-01: qty 20

## 2021-12-01 MED ORDER — METRONIDAZOLE 500 MG PO TABS: 1000.0000 mg | ORAL_TABLET | ORAL | Status: DC

## 2021-12-01 MED ORDER — ENOXAPARIN SODIUM 40 MG/0.4ML IJ SOSY
40.0000 mg | PREFILLED_SYRINGE | INTRAMUSCULAR | Status: DC
  Administered 2021-12-02 – 2021-12-03 (×2): 40 mg via SUBCUTANEOUS
  Filled 2021-12-01 (×2): qty 0.4

## 2021-12-01 MED ORDER — AMLODIPINE BESYLATE 10 MG PO TABS
10.0000 mg | ORAL_TABLET | Freq: Every day | ORAL | Status: DC
  Administered 2021-12-02: 10 mg via ORAL
  Filled 2021-12-01: qty 1

## 2021-12-01 MED ORDER — ONDANSETRON HCL 4 MG/2ML IJ SOLN: 4.0000 mg | Freq: Four times a day (QID) | INTRAMUSCULAR | Status: DC | PRN

## 2021-12-01 MED ORDER — PROPOFOL 10 MG/ML IV BOLUS
INTRAVENOUS | Status: AC
  Filled 2021-12-01: qty 20

## 2021-12-01 MED ORDER — ORAL CARE MOUTH RINSE: 15.0000 mL | Freq: Once | OROMUCOSAL | Status: AC

## 2021-12-01 MED ORDER — ALUM & MAG HYDROXIDE-SIMETH 200-200-20 MG/5ML PO SUSP: 30.0000 mL | Freq: Four times a day (QID) | ORAL | Status: DC | PRN

## 2021-12-01 MED ORDER — LAMOTRIGINE 100 MG PO TABS
100.0000 mg | ORAL_TABLET | Freq: Two times a day (BID) | ORAL | Status: DC
  Administered 2021-12-02 (×2): 100 mg via ORAL
  Filled 2021-12-01 (×2): qty 1

## 2021-12-01 MED ORDER — FENTANYL CITRATE (PF) 250 MCG/5ML IJ SOLN
INTRAMUSCULAR | Status: AC
  Filled 2021-12-01: qty 5

## 2021-12-01 MED ORDER — BUPIVACAINE-EPINEPHRINE (PF) 0.25% -1:200000 IJ SOLN
INTRAMUSCULAR | Status: AC
  Filled 2021-12-01: qty 60

## 2021-12-01 SURGICAL SUPPLY — 116 items
APPLIER CLIP 5 13 M/L LIGAMAX5 (MISCELLANEOUS)
APPLIER CLIP ROT 10 11.4 M/L (STAPLE)
BAG COUNTER SPONGE SURGICOUNT (BAG) ×2 IMPLANT
BLADE EXTENDED COATED 6.5IN (ELECTRODE) IMPLANT
CANNULA REDUC XI 12-8 STAPL (CANNULA) ×2
CANNULA REDUCER 12-8 DVNC XI (CANNULA) IMPLANT
CELLS DAT CNTRL 66122 CELL SVR (MISCELLANEOUS) ×1 IMPLANT
CHLORAPREP W/TINT 26 (MISCELLANEOUS) ×1 IMPLANT
CLIP APPLIE 5 13 M/L LIGAMAX5 (MISCELLANEOUS) IMPLANT
CLIP APPLIE ROT 10 11.4 M/L (STAPLE) IMPLANT
COVER SURGICAL LIGHT HANDLE (MISCELLANEOUS) ×4 IMPLANT
COVER TIP SHEARS 8 DVNC (MISCELLANEOUS) ×1 IMPLANT
COVER TIP SHEARS 8MM DA VINCI (MISCELLANEOUS) ×2
DEVICE TROCAR PUNCTURE CLOSURE (ENDOMECHANICALS) IMPLANT
DRAIN CHANNEL 19F RND (DRAIN) IMPLANT
DRAPE ARM DVNC X/XI (DISPOSABLE) ×4 IMPLANT
DRAPE COLUMN DVNC XI (DISPOSABLE) ×1 IMPLANT
DRAPE DA VINCI XI ARM (DISPOSABLE) ×8
DRAPE DA VINCI XI COLUMN (DISPOSABLE) ×2
DRAPE SURG IRRIG POUCH 19X23 (DRAPES) ×2 IMPLANT
DRSG OPSITE POSTOP 4X10 (GAUZE/BANDAGES/DRESSINGS) IMPLANT
DRSG OPSITE POSTOP 4X6 (GAUZE/BANDAGES/DRESSINGS) ×1 IMPLANT
DRSG OPSITE POSTOP 4X8 (GAUZE/BANDAGES/DRESSINGS) IMPLANT
DRSG TEGADERM 2-3/8X2-3/4 SM (GAUZE/BANDAGES/DRESSINGS) ×10 IMPLANT
DRSG TEGADERM 4X4.75 (GAUZE/BANDAGES/DRESSINGS) IMPLANT
ELECT PENCIL ROCKER SW 15FT (MISCELLANEOUS) ×2 IMPLANT
ELECT REM PT RETURN 15FT ADLT (MISCELLANEOUS) ×2 IMPLANT
ENDOLOOP SUT PDS II  0 18 (SUTURE)
ENDOLOOP SUT PDS II 0 18 (SUTURE) IMPLANT
EVACUATOR SILICONE 100CC (DRAIN) IMPLANT
GAUZE SPONGE 2X2 8PLY STRL LF (GAUZE/BANDAGES/DRESSINGS) ×1 IMPLANT
GLOVE SURG NEOPR MICRO LF SZ8 (GLOVE) ×6 IMPLANT
GLOVE SURG UNDER LTX SZ8 (GLOVE) ×6 IMPLANT
GOWN STRL REUS W/ TWL XL LVL3 (GOWN DISPOSABLE) ×4 IMPLANT
GOWN STRL REUS W/TWL XL LVL3 (GOWN DISPOSABLE) ×8
GRASPER SUT TROCAR 14GX15 (MISCELLANEOUS) IMPLANT
HOLDER FOLEY CATH W/STRAP (MISCELLANEOUS) ×2 IMPLANT
IRRIG SUCT STRYKERFLOW 2 WTIP (MISCELLANEOUS) ×2
IRRIGATION SUCT STRKRFLW 2 WTP (MISCELLANEOUS) ×1 IMPLANT
KIT PROCEDURE DA VINCI SI (MISCELLANEOUS)
KIT PROCEDURE DVNC SI (MISCELLANEOUS) IMPLANT
KIT SIGMOIDOSCOPE (SET/KITS/TRAYS/PACK) IMPLANT
KIT TURNOVER KIT A (KITS) ×1 IMPLANT
NDL INSUFFLATION 14GA 120MM (NEEDLE) ×1 IMPLANT
NEEDLE INSUFFLATION 14GA 120MM (NEEDLE) ×2 IMPLANT
PACK CARDIOVASCULAR III (CUSTOM PROCEDURE TRAY) ×2 IMPLANT
PACK COLON (CUSTOM PROCEDURE TRAY) ×2 IMPLANT
PAD POSITIONING PINK XL (MISCELLANEOUS) ×2 IMPLANT
PROTECTOR NERVE ULNAR (MISCELLANEOUS) ×3 IMPLANT
RELOAD STAPLE 45 3.5 BLU DVNC (STAPLE) IMPLANT
RELOAD STAPLE 45 4.3 GRN DVNC (STAPLE) IMPLANT
RELOAD STAPLE 60 2.5 WHT DVNC (STAPLE) IMPLANT
RELOAD STAPLE 60 3.5 BLU DVNC (STAPLE) IMPLANT
RELOAD STAPLE 60 4.3 GRN DVNC (STAPLE) IMPLANT
RELOAD STAPLER 2.5X60 WHT DVNC (STAPLE) ×1 IMPLANT
RELOAD STAPLER 3.5X45 BLU DVNC (STAPLE) IMPLANT
RELOAD STAPLER 3.5X60 BLU DVNC (STAPLE) ×2 IMPLANT
RELOAD STAPLER 4.3X45 GRN DVNC (STAPLE) IMPLANT
RELOAD STAPLER 4.3X60 GRN DVNC (STAPLE) IMPLANT
RETRACTOR WND ALEXIS 18 MED (MISCELLANEOUS) IMPLANT
RTRCTR WOUND ALEXIS 18CM MED (MISCELLANEOUS) ×2
SCISSORS LAP 5X35 DISP (ENDOMECHANICALS) ×2 IMPLANT
SEAL CANN UNIV 5-8 DVNC XI (MISCELLANEOUS) ×3 IMPLANT
SEAL XI 5MM-8MM UNIVERSAL (MISCELLANEOUS) ×6
SEALER VESSEL DA VINCI XI (MISCELLANEOUS) ×2
SEALER VESSEL EXT DVNC XI (MISCELLANEOUS) ×1 IMPLANT
SOLUTION ELECTROLUBE (MISCELLANEOUS) ×2 IMPLANT
SPIKE FLUID TRANSFER (MISCELLANEOUS) ×2 IMPLANT
SPONGE GAUZE 2X2 STER 10/PKG (GAUZE/BANDAGES/DRESSINGS) ×1
STAPLER 45 DA VINCI SURE FORM (STAPLE)
STAPLER 45 SUREFORM DVNC (STAPLE) IMPLANT
STAPLER 60 DA VINCI SURE FORM (STAPLE) ×2
STAPLER 60 SUREFORM DVNC (STAPLE) IMPLANT
STAPLER CANNULA SEAL DVNC XI (STAPLE) ×1 IMPLANT
STAPLER CANNULA SEAL XI (STAPLE) ×2
STAPLER ECHELON POWER CIR 29 (STAPLE) IMPLANT
STAPLER ECHELON POWER CIR 31 (STAPLE) IMPLANT
STAPLER RELOAD 2.5X60 WHITE (STAPLE) ×2
STAPLER RELOAD 2.5X60 WHT DVNC (STAPLE) ×1
STAPLER RELOAD 3.5X45 BLU DVNC (STAPLE)
STAPLER RELOAD 3.5X45 BLUE (STAPLE)
STAPLER RELOAD 3.5X60 BLU DVNC (STAPLE) ×2
STAPLER RELOAD 3.5X60 BLUE (STAPLE) ×4
STAPLER RELOAD 4.3X45 GREEN (STAPLE)
STAPLER RELOAD 4.3X45 GRN DVNC (STAPLE)
STAPLER RELOAD 4.3X60 GREEN (STAPLE)
STAPLER RELOAD 4.3X60 GRN DVNC (STAPLE)
STOPCOCK 4 WAY LG BORE MALE ST (IV SETS) ×4 IMPLANT
SURGILUBE 2OZ TUBE FLIPTOP (MISCELLANEOUS) IMPLANT
SUT MNCRL AB 4-0 PS2 18 (SUTURE) ×2 IMPLANT
SUT PDS AB 1 CT1 27 (SUTURE) ×4 IMPLANT
SUT PROLENE 0 CT 2 (SUTURE) IMPLANT
SUT PROLENE 2 0 KS (SUTURE) IMPLANT
SUT PROLENE 2 0 SH DA (SUTURE) IMPLANT
SUT SILK 2 0 (SUTURE) ×2
SUT SILK 2 0 SH CR/8 (SUTURE) IMPLANT
SUT SILK 2-0 18XBRD TIE 12 (SUTURE) IMPLANT
SUT SILK 3 0 (SUTURE)
SUT SILK 3 0 SH CR/8 (SUTURE) ×2 IMPLANT
SUT SILK 3-0 18XBRD TIE 12 (SUTURE) IMPLANT
SUT V-LOC BARB 180 2/0GR6 GS22 (SUTURE) ×4
SUT VIC AB 3-0 SH 18 (SUTURE) IMPLANT
SUT VIC AB 3-0 SH 27 (SUTURE)
SUT VIC AB 3-0 SH 27XBRD (SUTURE) IMPLANT
SUT VICRYL 0 UR6 27IN ABS (SUTURE) ×2 IMPLANT
SUTURE V-LC BRB 180 2/0GR6GS22 (SUTURE) IMPLANT
SYR 10ML ECCENTRIC (SYRINGE) ×2 IMPLANT
SYS LAPSCP GELPORT 120MM (MISCELLANEOUS)
SYS WOUND ALEXIS 18CM MED (MISCELLANEOUS)
SYSTEM LAPSCP GELPORT 120MM (MISCELLANEOUS) IMPLANT
SYSTEM WOUND ALEXIS 18CM MED (MISCELLANEOUS) ×1 IMPLANT
TOWEL OR NON WOVEN STRL DISP B (DISPOSABLE) ×2 IMPLANT
TRAY FOLEY MTR SLVR 16FR STAT (SET/KITS/TRAYS/PACK) ×2 IMPLANT
TROCAR ADV FIXATION 5X100MM (TROCAR) ×2 IMPLANT
TUBING CONNECTING 10 (TUBING) ×4 IMPLANT
TUBING INSUFFLATION 10FT LAP (TUBING) ×2 IMPLANT

## 2021-12-01 NOTE — Op Note (Signed)
12/01/2021 ? ?3:04 PM ? ?PATIENT:  Devin Ellison  66 y.o. male ? ?Patient Care Team: ?London Pepper, MD as PCP - General (Family Medicine) ?Michael Boston, MD as Consulting Physician (General Surgery) ?Wilford Corner, MD as Consulting Physician (Gastroenterology) ?Holly Bodily, MD as Referring Physician (Gastroenterology) ?Lajean Manes, MD as Attending Physician (Internal Medicine) ? ?PRE-OPERATIVE DIAGNOSIS:  recurrent ileocecal mass ? ?POST-OPERATIVE DIAGNOSIS:  Recurrent ileocecal mass ? ?PROCEDURE:  ?ROBOTIC PROXIMAL COLECTOMY ?ROBOTIC LYSIS OF ADHESIONS ?TRANSVERSUS ABDOMINIS PLANE (TAP) BLOCK - BILATERAL ? ?SURGEON:  Adin Hector, MD ? ?ASSISTANT: Leighton Ruff, MD, FACS, FASCRS ?An experienced assistant was required given the standard of surgical care given the complexity of the case.  This assistant was needed for exposure, dissection, suction, tissue approximation, retraction, perception, etc. ? ?ANESTHESIA:    ? ?General ? ?Regional TRANSVERSUS ABDOMINIS PLANE (TAP) nerve block for perioperative & postoperative pain control provided with liposomal bupivacaine (Experel) mixed with 0.25% bupivacaine as a Bilateral TAP block x 79m each side at the level of the transverse abdominis & preperitoneal spaces along the flank at the anterior axillary line, from subcostal ridge to iliac crest under laparoscopic guidance  ? ?Local field block at port sites & extraction wound ? ?EBL:  Total I/O ?In: 1600 [I.V.:1500; IV Piggyback:100] ?Out: 350 [Urine:300; Blood:50] ? ?Delay start of Pharmacological VTE agent (>24hrs) due to surgical blood loss or risk of bleeding:  no ? ?DRAINS: None  ? ?SPECIMEN: Proximal right colon with tattoo and recurrent ileocecal mass ? ?DISPOSITION OF SPECIMEN:  PATHOLOGY ? ?COUNTS:  YES ? ?PLAN OF CARE: Admit to inpatient  ? ?PATIENT DISPOSITION:  PACU - hemodynamically stable. ? ?INDICATION:   ? ?Patient with history of colon polyps.  Looks like he had had some type of  partial colectomy in the past.  Had an ileal cecal and transverse colon polyps that were removed by EMR in the past.  Developed recurrent mass at ileocecal valve despite EMR.  Concerning.  Surgical consultation requested I recommended segmental resection: ? ?The anatomy & physiology of the digestive tract was discussed.  The pathophysiology was discussed.  Natural history risks without surgery was discussed.   I worked to give an overview of the disease and the frequent need to have multispecialty involvement.  I feel the risks of no intervention will lead to serious problems that outweigh the operative risks; therefore, I recommended a partial colectomy to remove the pathology.  Laparoscopic & open techniques were discussed.   ?Risks such as bleeding, infection, abscess, leak, reoperation, possible ostomy, hernia, heart attack, death, and other risks were discussed.  I noted a good likelihood this will help address the problem.   Goals of post-operative recovery were discussed as well.  We will work to minimize complications.  An educational handout on the pathology was given as well.  Questions were answered.   ? ?The patient expresses understanding & wishes to proceed with surgery. ? ?OR FINDINGS:  ? ?Patient had patient had redundant right colon with dense adhesions of tattooing at ileocecal valve. ? ?No obvious metastatic disease on visceral parietal peritoneum or liver. ? ?It is an isoperistaltic ileocolonic anastomosis that rests in the right epigastric region. ? ?CASE DATA: ? ?Type of patient?: Elective [facility] Private Case ? ?Status of Case? Elective Scheduled ? ?Infection Present At Time Of Surgery (PATOS)?  NO ? ? ? ?DESCRIPTION:  ? ?Informed consent was confirmed.  The patient underwent general anaesthesia without difficulty.  The patient was positioned with arms  tucked & secured appropriately.  VTE prevention in place.  The patient's abdomen was clipped, prepped, & draped in a sterile fashion.  Surgical  timeout confirmed our plan. ? ?The patient was positioned in reverse Trendelenburg.  Abdominal entry was gained using Varess technique at the left subcostal ridge on the anterior abdominal wall.  No elevated EtCO2 noted.  Port placed.  Camera inspection revealed no injury.  Extra ports were carefully placed under direct laparoscopic visualization.  We docked the [employer] Vinci robot carefully and placed intstruments under visualization ? ?Patient had submitted moderate adhesions to the anterior abdominal wall primarily omentum and transverse colon.  We carefully freed these off.  Could see the ileocecal region with obvious tattooing adherent underneath the prior right lower quadrant transverse incision.  Appendix and ileum and cecum still present.  I mobilized & reflected the greater omentum in the upper abdomen.  Position the small bowel into the left lower quadrant and pelvis.   I was able to elevate the proximal colon to isolate the ileocolonic pedicle.  I scored the ileal mesentery just proximal to that.   I carried that further dissection in a medial to lateral fashion.  I was able to bluntly get into the retro-mesenteric plane on the right side.  I freed the proximal right sided colonic mesentery off the retroperitoneum including the duodenal sweep, pancreatic head, & Gerota's fascia of the right kidney. I was able to get underneath the hepatic flexure.  I was able to get underneath the proximal and mid transverse colon.  I isolated the proximal ileocecal pedicle.  I skeletonized it & transected the vessels.   ? ?I then proceeded to mobilize the terminal ileum & proximal "right" colon in a lateral to medial fashion.  I mobilized the distal ileal mesentery off its retroperitoneal and pelvic attachments.  I mobilized the ascending colon off It is side wall attachments to the paracolic gutter and retroperitoneum.  I also mobilized the greater omentum off the mid transverse colon and mobilized the mid to  proximal transverse colon in a superior to inferior fashion.  This allowed me to mobilize the hepatic flexure and get a complete mobilization of the proximal "right" colon to the mid-transverse colon.   I could isolate the pathology.  ? ?Chose an area in the distal ileum and transected the mesentery radially chose an area in the transverse colon just proximal to a dominant middle colic artery pedicle and transected that in a radial fashion to good location.  We confirmed good viability of the ileum and transverse colon plan for anastomosis. When he went ahead and proceeded with transection.  We transected at the distal ileum with a robotic stapler 52m blue load.  We then transected transverse colon with a robotic stapler 668mblue load.  We confirmed hemostasis.    ? ?I did a side-to-side stapled anastomosis of ileum to mid-transverse colon using a 6062mhite load in an isoperistaltic fashion.  (Distal stump of ileum to mid transverse colon for the distal end of the anastomosis.  Proximal end of colon stump to more proximal ileum for the proximal end of the anastomosis).  I sewed the common staple channel wound with an absorbable suture (V-lock) in a running ConFranquezshion from each corner and meeting in the center.  I did meticulous inspection prove an airtight closure.  I protected the anastomosis line with an anterior omentopexy of greater omentum using V lock suture.   ? ?We did reinspection of the abdomen.  Hemostasis was good.   Ureters, retroperitoneum, and bowel uninjured.  The anastomosis looked healthy.   Endoluminal gas was evacuated.  We placed the wound protector through the suprapubic 87m port site after it was enlarged in a Pfannenstiel fashion.  Specimen removed without incident.  ? ?Ports & wound protector removed.  Hemostasis was good.  Sterile unused instruments were used from this point.  I closed the skin at the port sites using Monocryl stitch and sterile dressing.  I closed the extraction  wound using a 0 Vicryl vertical peritoneal closure and a #1 PDS transverse anterior rectal fascial closure like a small Pfannenstiel closure. I closed the skin with some interrupted Monocryl stitches.  I placed steril

## 2021-12-01 NOTE — Transfer of Care (Signed)
Immediate Anesthesia Transfer of Care Note ? ?Patient: Devin Ellison ? ?Procedure(s) Performed: ROBOTIC PROXIMAL COLECTOMY WITH LYSIS OF ADHESIONS AND BILATERAL TAP BLOCK (Abdomen) ? ?Patient Location: PACU ? ?Anesthesia Type:General ? ?Level of Consciousness: sedated, patient cooperative and responds to stimulation ? ?Airway & Oxygen Therapy: Patient Spontanous Breathing and Patient connected to face mask oxygen ? ?Post-op Assessment: Report given to RN and Post -op Vital signs reviewed and stable ? ?Post vital signs: Reviewed and stable ? ?Last Vitals:  ?Vitals Value Taken Time  ?BP 189/101 12/01/21 1509  ?Temp 36.6 ?C 12/01/21 1509  ?Pulse 64 12/01/21 1513  ?Resp 15 12/01/21 1513  ?SpO2 100 % 12/01/21 1513  ?Vitals shown include unvalidated device data. ? ?Last Pain:  ?Vitals:  ? 12/01/21 1509  ?PainSc: Asleep  ?   ? ?  ? ?Complications: No notable events documented. ?

## 2021-12-01 NOTE — Anesthesia Procedure Notes (Signed)
Procedure Name: Intubation ?Date/Time: 12/01/2021 1:07 PM ?Performed by: Gean Maidens, CRNA ?Pre-anesthesia Checklist: Patient identified, Emergency Drugs available, Suction available, Patient being monitored and Timeout performed ?Patient Re-evaluated:Patient Re-evaluated prior to induction ?Oxygen Delivery Method: Circle system utilized ?Preoxygenation: Pre-oxygenation with 100% oxygen ?Induction Type: IV induction ?Ventilation: Mask ventilation without difficulty ?Laryngoscope Size: Mac and 4 ?Grade View: Grade I ?Tube type: Oral ?Tube size: 7.5 mm ?Number of attempts: 1 ?Airway Equipment and Method: Stylet ?Placement Confirmation: ETT inserted through vocal cords under direct vision, positive ETCO2 and breath sounds checked- equal and bilateral ?Secured at: 23 cm ?Tube secured with: Tape ?Dental Injury: Teeth and Oropharynx as per pre-operative assessment  ? ? ? ? ?

## 2021-12-01 NOTE — Anesthesia Postprocedure Evaluation (Signed)
Anesthesia Post Note ? ?Patient: Devin Ellison ? ?Procedure(s) Performed: ROBOTIC PROXIMAL COLECTOMY WITH LYSIS OF ADHESIONS AND BILATERAL TAP BLOCK (Abdomen) ? ?  ? ?Patient location during evaluation: PACU ?Anesthesia Type: General ?Level of consciousness: awake and alert and oriented ?Pain management: pain level controlled ?Vital Signs Assessment: post-procedure vital signs reviewed and stable ?Respiratory status: spontaneous breathing, nonlabored ventilation and respiratory function stable ?Cardiovascular status: blood pressure returned to baseline and stable ?Postop Assessment: no apparent nausea or vomiting ?Anesthetic complications: no ? ? ?No notable events documented. ? ?Last Vitals:  ?Vitals:  ? 12/01/21 1545 12/01/21 1553  ?BP: (!) 166/92   ?Pulse:  67  ?Resp:  11  ?Temp:    ?SpO2:  98%  ?  ?Last Pain:  ?Vitals:  ? 12/01/21 1553  ?PainSc: 5   ? ? ?  ?  ?  ?  ?  ?  ? ?Devin Weltz A. ? ? ? ? ?

## 2021-12-01 NOTE — Discharge Instructions (Signed)
SURGERY: POST OP INSTRUCTIONS ?(Surgery for small bowel obstruction, colon resection, etc) ? ? ?###################################################################### ? ?EAT ?Gradually transition to a high fiber diet with a fiber supplement over the next few days after discharge ? ?WALK ?Walk an hour a day.  Control your pain to do that.   ? ?CONTROL PAIN ?Control pain so that you can walk, sleep, tolerate sneezing/coughing, go up/down stairs. ? ?HAVE A BOWEL MOVEMENT DAILY ?Keep your bowels regular to avoid problems.  OK to try a laxative to override constipation.  OK to use an antidairrheal to slow down diarrhea.  Call if not better after 2 tries ? ?CALL IF YOU HAVE PROBLEMS/CONCERNS ?Call if you are still struggling despite following these instructions. ?Call if you have concerns not answered by these instructions ? ?###################################################################### ? ? ?DIET ?Follow a light diet the first few days at home.  Start with a bland diet such as soups, liquids, starchy foods, low fat foods, etc.  If you feel full, bloated, or constipated, stay on a ful liquid or pureed/blenderized diet for a few days until you feel better and no longer constipated. ?Be sure to drink plenty of fluids every day to avoid getting dehydrated (feeling dizzy, not urinating, etc.). ?Gradually add a fiber supplement to your diet over the next week.  Gradually get back to a regular solid diet.  Avoid fast food or heavy meals the first week as you are more likely to get nauseated. ?It is expected for your digestive tract to need a few months to get back to normal.  It is common for your bowel movements and stools to be irregular.  You will have occasional bloating and cramping that should eventually fade away.  Until you are eating solid food normally, off all pain medications, and back to regular activities; your bowels will not be normal. ?Focus on eating a low-fat, high fiber diet the rest of your life  (See Getting to [place], below). ? ?CARE of your INCISION or WOUND ?It is good for closed incision and even open wounds to be washed every day.  Shower every day.  Short baths are fine.  Wash the incisions and wounds clean with soap & water.    ? ?If you have a closed incision(s), wash the incision with soap & water every day.  You may leave closed incisions open to air if it is dry.   You may cover the incision with clean gauze & replace it after your daily shower for comfort. ? ?It is good for closed incisions and even open wounds to be washed every day.  Shower every day.  Short baths are fine.  Wash the incisions and wounds clean with soap & water.    ?You may leave closed incisions open to air if it is dry.   You may cover the incision with clean gauze & replace it after your daily shower for comfort. ? ?TEGADERM:  You have clear gauze band-aid dressings over your closed incision(s).  Remove the dressings 3 days after surgery. = 01April ? ? ?If you have an open wound with a wound vac, see wound vac care instructions. ? ? ? ? ?ACTIVITIES as tolerated ?Start light daily activities --- self-care, walking, climbing stairs-- beginning the day after surgery.  Gradually increase activities as tolerated.  Control your pain to be active.  Stop when you are tired.  Ideally, walk several times a day, eventually an hour a day.   ?Most people are back to most day-to-day  activities in a few weeks.  It takes 4-8 weeks to get back to unrestricted, intense activity. ?If you can walk 30 minutes without difficulty, it is safe to try more intense activity such as jogging, treadmill, bicycling, low-impact aerobics, swimming, etc. ?Save the most intensive and strenuous activity for last (Usually 4-8 weeks after surgery) such as sit-ups, heavy lifting, contact sports, etc.  Refrain from any intense heavy lifting or straining until you are off narcotics for pain control.  You will have off days, but things should improve  week-by-week. ?DO NOT PUSH THROUGH PAIN.  Let pain be your guide: If it hurts to do something, don't do it.  Pain is your body warning you to avoid that activity for another week until the pain goes down. ?You may drive when you are no longer taking narcotic prescription pain medication, you can comfortably wear a seatbelt, and you can safely make sudden turns/stops to protect yourself without hesitating due to pain. ?You may have sexual intercourse when it is comfortable. If it hurts to do something, stop. ? ?MEDICATIONS ?Take your usually prescribed home medications unless otherwise directed.   ?Blood thinners:  ?Usually you can restart any strong blood thinners after the second postoperative day.  It is OK to take aspirin right away.    ? If you are on strong blood thinners (warfarin/Coumadin, Plavix, Xerelto, Eliquis, Pradaxa, etc), discuss with your surgeon, medicine PCP, and/or cardiologist for instructions on when to restart the blood thinner & if blood monitoring is needed (PT/INR blood check, etc).   ? ? ?PAIN CONTROL ?Pain after surgery or related to activity is often due to strain/injury to muscle, tendon, nerves and/or incisions.  This pain is usually short-term and will improve in a few months.  ?To help speed the process of healing and to get back to regular activity more quickly, DO THE FOLLOWING THINGS TOGETHER: ?Increase activity gradually.  DO NOT PUSH THROUGH PAIN ?Use Ice and/or Heat ?Try Gentle Massage and/or Stretching ?Take over the counter pain medication ?Take Narcotic prescription pain medication for more severe pain ? ?Good pain control = faster recovery.  It is better to take more medicine to be more active than to stay in bed all day to avoid medications. ? Increase activity gradually ?Avoid heavy lifting at first, then increase to lifting as tolerated over the next 6 weeks. ?Do not ?push through? the pain.  Listen to your body and avoid positions and maneuvers than reproduce the pain.   Wait a few days before trying something more intense ?Walking an hour a day is encouraged to help your body recover faster and more safely.  Start slowly and stop when getting sore.  If you can walk 30 minutes without stopping or pain, you can try more intense activity (running, jogging, aerobics, cycling, swimming, treadmill, sex, sports, weightlifting, etc.) ?Remember: If it hurts to do it, then don?t do it! ?Use Ice and/or Heat ?You will have swelling and bruising around the incisions.  This will take several weeks to resolve. ?Ice packs or heating pads (6-8 times a day, 30-60 minutes at a time) will help sooth soreness & bruising. ?Some people prefer to use ice alone, heat alone, or alternate between ice & heat.  Experiment and see what works best for you.  Consider trying ice for the first few days to help decrease swelling and bruising; then, switch to heat to help relax sore spots and speed recovery. ?Shower every day.  Short baths are fine.  It  feels good!  Keep the incisions and wounds clean with soap & water.   ?Try Gentle Massage and/or Stretching ?Massage at the area of pain many times a day ?Stop if you feel pain - do not overdo it ?Take over the counter pain medication ?This helps the muscle and nerve tissues become less irritable and calm down faster ?Choose ONE of the following over-the-counter anti-inflammatory medications: ?Acetaminophen '500mg'$  tabs (Tylenol) 1-2 pills with every meal and just before bedtime (avoid if you have liver problems or if you have acetaminophen in you narcotic prescription) ?Naproxen '220mg'$  tabs (ex. Aleve, Naprosyn) 1-2 pills twice a day (avoid if you have kidney, stomach, IBD, or bleeding problems) ?Ibuprofen '200mg'$  tabs (ex. Advil, Motrin) 3-4 pills with every meal and just before bedtime (avoid if you have kidney, stomach, IBD, or bleeding problems) ?Take with food/snack several times a day as directed for at least 2 weeks to help keep pain / soreness down & more  manageable. ?Take Narcotic prescription pain medication for more severe pain ?A prescription for strong pain control is often given to you upon discharge (for example: oxycodone/Percocet, hydrocodone/Norco/Vicodin,

## 2021-12-01 NOTE — Anesthesia Preprocedure Evaluation (Addendum)
Anesthesia Evaluation  ?Patient identified by MRN, date of birth, ID band ?Patient awake ? ? ? ?Reviewed: ?Allergy & Precautions, NPO status , Patient's Chart, lab work & pertinent test results, reviewed documented beta blocker date and time  ? ?Airway ?Mallampati: II ? ?TM Distance: >3 FB ?Neck ROM: Full ? ? ? Dental ?no notable dental hx. ?(+) Teeth Intact, Caps, Dental Advisory Given ?  ?Pulmonary ?Current Smoker,  ?  ?Pulmonary exam normal ?breath sounds clear to auscultation ? ? ? ? ? ? Cardiovascular ?hypertension, Pt. on medications ?Normal cardiovascular exam ?Rhythm:Regular Rate:Normal ? ?EKG 05/12/21 ?NSR, Normal  ?  ?Neuro/Psych ?PSYCHIATRIC DISORDERS Depression Bipolar Disorder negative neurological ROS ?   ? GI/Hepatic ?Neg liver ROS, Unresectable colon polyp ?  ?Endo/Other  ?diabetes, Well Controlled, Type 2, Oral Hypoglycemic AgentsHyperlipidemia ? Renal/GU ?negative Renal ROS  ?negative genitourinary ?  ?Musculoskeletal ?negative musculoskeletal ROS ?(+)  ? Abdominal ?  ?Peds ? Hematology ?negative hematology ROS ?(+)   ?Anesthesia Other Findings ? ? Reproductive/Obstetrics ? ?  ? ? ? ? ? ? ? ? ? ? ? ? ? ?  ?  ? ? ? ? ? ? ? ?Anesthesia Physical ?Anesthesia Plan ? ?ASA: 2 ? ?Anesthesia Plan: General  ? ?Post-op Pain Management: Precedex and Ketamine IV*  ? ?Induction: Intravenous ? ?PONV Risk Score and Plan: 3 and Treatment may vary due to age or medical condition, Ondansetron and Dexamethasone ? ?Airway Management Planned: Oral ETT ? ?Additional Equipment:  ? ?Intra-op Plan:  ? ?Post-operative Plan: Extubation in OR ? ?Informed Consent: I have reviewed the patients History and Physical, chart, labs and discussed the procedure including the risks, benefits and alternatives for the proposed anesthesia with the patient or authorized representative who has indicated his/her understanding and acceptance.  ? ? ? ?Dental advisory given ? ?Plan Discussed with: CRNA and  Anesthesiologist ? ?Anesthesia Plan Comments:   ? ? ? ? ? ?Anesthesia Quick Evaluation ? ?

## 2021-12-01 NOTE — H&P (Signed)
12/01/2021 ? ? ? ?REFERRING PHYSICIAN: Lear Ng, MD ? ?Patient Care Team: ?Arnetha Courser, MD as PCP - General (Family Medicine) ?Nancy Fetter, MD (Gastroenterology) ? ?PROVIDER: Hollace Kinnier, MD ? ?[facility] MRN: [id number] ?DOB: Feb 22, 1956 ?DATE OF ENCOUNTER: 10/25/2021 ? ?SUBJECTIVE  ? ?Chief Complaint: Colon Polyps (Surgery consult) ? ? ?History of Present Illness: ?Devin Ellison is a 66 y.o. male who is seen today  ?as an office consultation at the request of Dr. Michail Sermon  ?for evaluation of Colon Polyps (Surgery consult) ?.  ? ?Pleasant patient with history of colon polyps. He is here with his ex-wife who acts as a friend. History of diabetes on oral hypoglycemics. Last A1c 7.2. Intermittently smokes -now back on cigarettes. Hypertension. Question of bipolar disorder. ? ?Patient had surgery 1993 by Dr. Lennie Hummer to remove the polyp. Records are not available but looking at the more recent CAT scan the staple line looks like it is in the ascending colon. Apparently that was complicated by wound infection that required packing but not reoperation. No ostomy. Because of his polyps, patient has had intermittent endoscopies. He was found to have a polyp presumed to be on the descending colon in 2017. Not safe to remove endoscopically by [facility]. Surgery consultation sent to my partner Dr. Marcello Moores with our group in [place] in 2017. She recommended referral for EMR resection before committing to the hemicolectomy. Tillie Rung, MD at [facility] was able to do EMR resection of the left-sided polyp. Also noted an adenomatous polyp near the ileocecal valve that was resected. Both came back with adenomatous tissue with no high-grade dysplasia no malignancy. ? ?More recently patient switched from Dr. Oletta Lamas to Dr. Michail Sermon with [facility]. Endoscopy done last month. A few small polyps removed. Old tattoo seen on the transverse/descending colon at prior site of EMR with no recurrence there. However larger  polypoid mass noted in the ileocecal region. Biopsy again shows adenomatous tissue. Surgical consultation requested. Patient had some abdominal pain around polypectomy and had a CAT scan done which showed no giant mass or metastatic disease. Looks like old staples in the colon proximal to the hepatic flexure. ? ?Patient moves his bowels once a day. No history of IBS irritable bowel syndrome. He is up on his feet and can walk for several hours without difficulty. No sleep apnea. Not any blood thinners. ? ?Ready for surgery ? ? ? ?Medical History: ? ?Past Medical History:  ?Diagnosis Date  ? Bipolar disorder, unspecified (CMS-HCC)  ? Hypertension  ? Sleep apnea  ?Non-compliant with CPAP  ? Wears glasses  ? ?Patient Active Problem List  ?Diagnosis  ? Adenomatous polyp of colon  ? ?Past Surgical History:  ?Procedure Laterality Date  ? COLECTOMY PARTIAL W/ANASTAMOSIS 1983  ? COLONOSCOPY W/REMOVAL LESIONS BY SNARE N/A 06/27/2016  ?Procedure: COLONOSCOPY, FLEXIBLE; WITH REMOVAL OF TUMOR(S), POLYP(S), OR OTHER LESION(S) BY SNARE TECHNIQUE; Surgeon: Vernie Ammons, MD; Location: [place]; Service: Gastroenterology; Laterality: N/A;  ? ankle surgery Right  ? hand surgery Right  ? ? ?No Known Allergies ? ?Current Outpatient Medications on File Prior to Visit  ?Medication Sig Dispense Refill  ? atorvastatin (LIPITOR) 10 MG tablet 1 tablet  ? losartan (COZAAR) 100 MG tablet Take 1 tablet by mouth once daily  ? metFORMIN (GLUCOPHAGE) 1000 MG tablet 1 tablet with a meal  ? QUEtiapine (SEROQUEL XR) 300 MG 24 hr tablet Take 300 mg by mouth at bedtime  ? amLODIPine (NORVASC) 10 MG tablet  Take 10 mg by mouth once daily  ? ARIPiprazole (ABILIFY) 5 MG tablet Take 5 mg by mouth every morning. 0  ? divalproex (DEPAKOTE ER) 500 MG ER tablet TAKE 3 TO 4 TABLETS BY MOUTH IN THE EVENING  ? lamoTRIgine (LAMICTAL) 150 MG tablet Take 150 mg by mouth every morning.  ?0  ? lithium 600 MG capsule Take 600 mg by mouth 2 (two)  times daily.  ? ? VIAGRA 100 mg tablet TAKE 1/2 TO 1 TABLET BY MOUTH ONCE DAILY AS NEEDED FOR ERECTILE DYSFUNCTION 7  ? ?No current facility-administered medications on file prior to visit.  ? ?Family History  ?Problem Relation Age of Onset  ? No Known Problems Mother  ?living at age 73  ? Liver disease Father  ?Hep B related  ? Lymphoma Sister  ?large cell  ? Leukemia Sister  ? Breast cancer Sister  ? No Known Problems Sister  ? Cerebral palsy Sister  ?lived to age 67  ? No Known Problems Daughter  ? No Known Problems Son  ? Anesthesia problems Neg Hx  ? Malignant hypertension Neg Hx  ? ? ?Social History  ? ?Tobacco Use  ?Smoking Status Former  ? Packs/day: 1.00  ? Years: 25.00  ? Pack years: 25.00  ? Types: Cigarettes  ? Quit date: 04/11/2012  ? Years since quitting: 9.5  ?Smokeless Tobacco Never  ? ? ?Social History  ? ?Socioeconomic History  ? Marital status: Unknown  ? Number of children: 2  ?Tobacco Use  ? Smoking status: Former  ?Packs/day: 1.00  ?Years: 25.00  ?Pack years: 25.00  ?Types: Cigarettes  ?Quit date: 04/11/2012  ?Years since quitting: 9.5  ? Smokeless tobacco: Never  ?Substance and Sexual Activity  ? Alcohol use: Yes  ?Alcohol/week: 2.0 standard drinks  ?Types: 2 Cans of beer per week  ?Comment: previous history up to 6 per night x3 years in the early 2000s  ? Drug use: No  ? ?############################################################ ? ?Review of Systems: ?A complete review of systems (ROS) was obtained from the patient. I have reviewed this information and discussed as appropriate with the patient. See HPI as well for other pertinent ROS. ? ?Constitutional: No fevers, chills, sweats. Weight stable ?Eyes: No vision changes, No discharge ?HENT: No sore throats, nasal drainage ?Lymph: No neck swelling, No bruising easily ?Pulmonary: No cough, productive sputum ?CV: No orthopnea, PND Patient walks 120 minutes for about 5 miles without difficulty. No exertional chest/neck/shoulder/arm pain. ? ?GI: See  HPI  ?no personal nor family history of GI/colon cancer, inflammatory bowel disease, irritable bowel syndrome, allergy such as Celiac Sprue, dietary/dairy problems, colitis, ulcers nor gastritis. No recent sick contacts/gastroenteritis. No travel outside the country. No changes in diet. ? ?Renal: No UTIs, No hematuria ?Genital: No drainage, bleeding, masses ?Musculoskeletal: No severe joint pain. Good ROM major joints ?Skin: No sores or lesions ?Heme/Lymph: No easy bleeding. No swollen lymph nodes ? ?OBJECTIVE  ? ?Vitals:  ?10/25/21 0847  ?BP: 110/70  ?Pulse: 78  ?Temp: 36.9 ?C (98.4 ?F)  ?SpO2: 99%  ?Weight: (!) 101.2 kg (223 lb 3.2 oz)  ?Height: 182.9 cm (6')  ? ? ?Body mass index is 30.27 kg/m?. ? ?PHYSICAL EXAM: ? ?Constitutional: Not cachectic. Hygeine adequate. Vitals signs as above.  ?Eyes: Pupils reactive, normal extraocular movements. Sclera nonicteric ?Neuro: CN II-XII intact. No major focal sensory defects. No major motor deficits. ?Lymph: No head/neck/groin lymphadenopathy ?Psych: No severe agitation. No severe anxiety. Judgment & insight Adequate, Oriented x4, ?HENT: Normocephalic,  Mucus membranes moist. No thrush. Hearing: adequate ?Neck: Supple, No tracheal deviation. No obvious thyromegaly ?Chest: No pain to chest wall compression. Good respiratory excursion. No audible wheezing ?CV: Pulses intact. Regular rhythm. No major extremity edema ?Ext: No obvious deformity or contracture. Edema: Not present. No cyanosis ?Skin: No major subcutaneous nodules. Warm and dry ?Musculoskeletal: Severe joint rigidity not present. No obvious clubbing. No digital petechiae. Mobility: no assist device moving easily without restrictions ? ?Abdomen: Obese Soft. Nondistended. Nontender. Right lower quadrant transverse scar consistent with prior partial colectomy hernia: Not present. Diastasis recti: Not present. No hepatomegaly. No splenomegaly. ? ?Genital/Pelvic: Inguinal hernia: Not present. Inguinal lymph nodes:  without lymphadenopathy nor hidradenitis.  ? ?Rectal: (Deferred) ? ? ? ?################################################################### ? ?Labs, Imaging and Diagnostic Testing: ? ?Located in [facility]

## 2021-12-02 LAB — CBC
HCT: 40 % (ref 39.0–52.0)
Hemoglobin: 13.6 g/dL (ref 13.0–17.0)
MCH: 31.2 pg (ref 26.0–34.0)
MCHC: 34 g/dL (ref 30.0–36.0)
MCV: 91.7 fL (ref 80.0–100.0)
Platelets: 267 10*3/uL (ref 150–400)
RBC: 4.36 MIL/uL (ref 4.22–5.81)
RDW: 13.4 % (ref 11.5–15.5)
WBC: 17.5 10*3/uL — ABNORMAL HIGH (ref 4.0–10.5)
nRBC: 0 % (ref 0.0–0.2)

## 2021-12-02 LAB — GLUCOSE, CAPILLARY
Glucose-Capillary: 158 mg/dL — ABNORMAL HIGH (ref 70–99)
Glucose-Capillary: 184 mg/dL — ABNORMAL HIGH (ref 70–99)
Glucose-Capillary: 132 mg/dL — ABNORMAL HIGH (ref 70–99)
Glucose-Capillary: 188 mg/dL — ABNORMAL HIGH (ref 70–99)

## 2021-12-02 LAB — BASIC METABOLIC PANEL
Anion gap: 7 (ref 5–15)
BUN: 11 mg/dL (ref 8–23)
CO2: 23 mmol/L (ref 22–32)
Calcium: 8.6 mg/dL — ABNORMAL LOW (ref 8.9–10.3)
Chloride: 101 mmol/L (ref 98–111)
Creatinine, Ser: 0.87 mg/dL (ref 0.61–1.24)
GFR, Estimated: >60 mL/min (ref >60)
Glucose, Bld: 145 mg/dL — ABNORMAL HIGH (ref 70–99)
Potassium: 4.2 mmol/L (ref 3.5–5.1)
Sodium: 131 mmol/L — ABNORMAL LOW (ref 135–145)

## 2021-12-02 NOTE — Progress Notes (Signed)
?  Transition of Care (TOC) Screening Note ? ? ?Patient Details  ?Name: Devin Ellison ?Date of Birth: [birth year] ? ? ?Transition of Care (TOC) CM/SW Contact:    ?Jaquin Coy, LCSW ?Phone Number: ?12/02/2021, 1:41 PM ? ? ? ?Transition of Care Department [facility]) has reviewed patient and no TOC needs have been identified at this time. We will continue to monitor patient advancement through interdisciplinary progression rounds. If new patient transition needs arise, please place a TOC consult. ? ? ?

## 2021-12-02 NOTE — Progress Notes (Signed)
? ?Devin Ellison ?[id number] ?[birth year] ? ?CARE TEAM: ? ?PCP: London Pepper, MD ? ?Outpatient Care Team: Patient Care Team: ?London Pepper, MD as PCP - General (Family Medicine) ?Michael Boston, MD as Consulting Physician (General Surgery) ?Wilford Corner, MD as Consulting Physician (Gastroenterology) ?Holly Bodily, MD as Referring Physician (Gastroenterology) ?Lajean Manes, MD as Attending Physician (Internal Medicine) ? ?Inpatient Treatment Team: Treatment Team: Attending Provider: Michael Boston, MD; Charge Nurse: Arminda Resides, RN; Technician: Lewanda Rife, NT; Registered Nurse: Charlyne Petrin, RN; Technician: Wright, [place] E, NT ? ? ?Problem List:  ? ?Principal Problem: ?  Recurrent colon polyp s/p robotic colectomy 12/01/2021 ?Active Problems: ?  MDD (major depressive disorder), single episode ?  Essential hypertension ?  Bipolar affective disorder, current episode manic ([place]) ?  S/P partial colectomy ? ? ?POST-OPERATIVE DIAGNOSIS:  Recurrent ileocecal mass ?  ?PROCEDURE:  ?ROBOTIC PROXIMAL COLECTOMY ?ROBOTIC LYSIS OF ADHESIONS ?TRANSVERSUS ABDOMINIS PLANE (TAP) BLOCK - BILATERAL ?  ?SURGEON:  Adin Hector, MD ?    ?OR FINDINGS:  ?  ?Patient had patient had redundant right colon with dense adhesions of tattooing at ileocecal valve. ?  ?No obvious metastatic disease on visceral parietal peritoneum or liver. ?  ?It is an isoperistaltic ileocolonic anastomosis that rests in the right epigastric region. ? ? ?Assessment ? ?Recovering ? ?(Hospital Stay = 1 days) ? ?Plan: ? ?-ERAS protocol ?-f/u pathology ?-adv diet ?-foley out ?-d/c IVF ?-Hypertension.  As needed medication backup for now.  Resume amlodipine ?-VTE prophylaxis- SCDs, etc ?-mobilize as tolerated to help recovery ? ?Disposition:  ?Disposition:  ?The patient is from: Home ? ?Anticipate discharge to:  Home ? ?Anticipated Date of Discharge is:  March 31,2023 ?  ? ?Barriers to discharge:  Pending Clinical improvement (more likely  than not) ? ?Patient currently is NOT MEDICALLY STABLE for discharge from the hospital from a surgery standpoint. ? ? ? ? ? ?I reviewed nursing notes, last 24 h vitals and pain scores, last 48 h intake and output, last 24 h labs and trends, and last 24 h imaging results. I have reviewed this patient's available data, including medical history, events of note, test results, etc as part of my evaluation.  A significant portion of that time was spent in counseling.  Care during the described time interval was provided by me. ? ?This care required moderate level of medical decision making.  12/02/2021 ? ? ? ?Subjective: ?(Chief complaint) ? ?Not too sore ? ?Tolerated liquids.  Hungry.  Wishes to advance diet. ? ?Wanting to get up more. ? ?Objective: ? ?Vital signs: ? ?Vitals:  ? 12/01/21 2152 12/02/21 0158 12/02/21 0500 12/02/21 0615  ?BP: 140/81 125/80  122/73  ?Pulse: 87 70  71  ?Resp: '16 17  18  '$ ?Temp: 98.4 ?F (36.9 ?C) 98.2 ?F (36.8 ?C)  98.1 ?F (36.7 ?C)  ?TempSrc: Oral Oral  Oral  ?SpO2: 95% 96%  98%  ?Weight:   103.3 kg   ?Height:      ? ? ?  ? ?Intake/Output  ? ?Yesterday: ? 03/29 0701 - 03/30 0700 ?In: 3294.4 [P.O.:600; I.V.:2594.4; IV Piggyback:100] ?Out: 3875 [Urine:3825; Blood:50] ?This shift: ? No intake/output data recorded. ? ?Bowel function: ? Flatus: No ? BM:  No ? Drain: (No drain) ? ? ?Physical Exam: ? ?General: Pt awake/alert in no acute distress ?Eyes: PERRL, normal EOM.  Sclera clear.  No icterus ?Neuro: CN II-XII intact w/o focal sensory/motor deficits. ?Lymph: No head/neck/groin lymphadenopathy ?Psych:  No delerium/psychosis/paranoia.  Oriented x 4 ?HENT: Normocephalic, Mucus membranes moist.  No thrush ?Neck: Supple, No tracheal deviation.  No obvious thyromegaly ?Chest: No pain to chest wall compression.  Good respiratory excursion.  No audible wheezing ?CV:  Pulses intact.  Regular rhythm.  No major extremity edema ?MS: Normal AROM mjr joints.  No obvious deformity ? ?Abdomen: Soft.   Nondistended.  Mildly tender at incisions only.  Dressings clean dry and intact.  no evidence of peritonitis.  No incarcerated hernias. ? ?Ext:   No deformity.  No mjr edema.  No cyanosis ?Skin: No petechiae / purpurea.  No major sores.  Warm and dry ? ? ? ?Results:  ? ?Cultures: ?No results found for this or any previous visit (from the past 720 hour(s)). ? ?Labs: ?Results for orders placed or performed during the hospital encounter of 12/01/21 (from the past 48 hour(s))  ?Glucose, capillary     Status: Abnormal  ? Collection Time: 12/01/21 12:04 PM  ?Result Value Ref Range  ? Glucose-Capillary 133 (H) 70 - 99 mg/dL  ?  Comment: Glucose reference range applies only to samples taken after fasting for at least 8 hours.  ?Glucose, capillary     Status: Abnormal  ? Collection Time: 12/01/21  3:11 PM  ?Result Value Ref Range  ? Glucose-Capillary 181 (H) 70 - 99 mg/dL  ?  Comment: Glucose reference range applies only to samples taken after fasting for at least 8 hours.  ?Glucose, capillary     Status: Abnormal  ? Collection Time: 12/01/21  9:55 PM  ?Result Value Ref Range  ? Glucose-Capillary 223 (H) 70 - 99 mg/dL  ?  Comment: Glucose reference range applies only to samples taken after fasting for at least 8 hours.  ?CBC     Status: Abnormal  ? Collection Time: 12/02/21  4:41 AM  ?Result Value Ref Range  ? WBC 17.5 (H) 4.0 - 10.5 K/uL  ? RBC 4.36 4.22 - 5.81 MIL/uL  ? Hemoglobin 13.6 13.0 - 17.0 g/dL  ? HCT 40.0 39.0 - 52.0 %  ? MCV 91.7 80.0 - 100.0 fL  ? MCH 31.2 26.0 - 34.0 pg  ? MCHC 34.0 30.0 - 36.0 g/dL  ? RDW 13.4 11.5 - 15.5 %  ? Platelets 267 150 - 400 K/uL  ? nRBC 0.0 0.0 - 0.2 %  ?  Comment: Performed at [facility], [place] [address], [place], [place] [address]  ?Basic metabolic panel     Status: Abnormal  ? Collection Time: 12/02/21  4:41 AM  ?Result Value Ref Range  ? Sodium 131 (L) 135 - 145 mmol/L  ? Potassium 4.2 3.5 - 5.1 mmol/L  ? Chloride 101 98 - 111 mmol/L  ? CO2 23 22 - 32 mmol/L   ? Glucose, Bld 145 (H) 70 - 99 mg/dL  ?  Comment: Glucose reference range applies only to samples taken after fasting for at least 8 hours.  ? BUN 11 8 - 23 mg/dL  ? Creatinine, Ser 0.87 0.61 - 1.24 mg/dL  ? Calcium 8.6 (L) 8.9 - 10.3 mg/dL  ? GFR, Estimated >60 >60 mL/min  ?  Comment: (NOTE) ?Calculated using the CKD-EPI Creatinine Equation (2021) ?  ? Anion gap 7 5 - 15  ?  Comment: Performed at [facility], [place] [address] [place], [place] [address]  ? ? ?Imaging / Studies: ?No results found. ? ?Medications / Allergies: per chart ? ?Antibiotics: ?Anti-infectives (From admission, onward)  ? ? Start  Dose/Rate Route Frequency Ordered Stop  ? 12/01/21 1400  neomycin (MYCIFRADIN) tablet 1,000 mg  Status:  Discontinued       ?See Hyperspace for full Linked Orders Report.  ? 1,000 mg Oral 3 times per day 12/01/21 1200 12/01/21 1201  ? 12/01/21 1400  metroNIDAZOLE (FLAGYL) tablet 1,000 mg  Status:  Discontinued       ?See Hyperspace for full Linked Orders Report.  ? 1,000 mg Oral 3 times per day 12/01/21 1200 12/01/21 1201  ? 12/01/21 1200  cefoTEtan (CEFOTAN) 2 g in sodium chloride 0.9 % 100 mL IVPB       ? 2 g ?200 mL/hr over 30 Minutes Intravenous On call to O.R. 12/01/21 1159 12/01/21 1307  ? ?  ? ? ? ? ?Note: ?Portions of this report may have been transcribed using voice recognition software. Every effort was made to ensure accuracy; however, inadvertent computerized transcription errors may be present.   Any transcriptional errors that result from this process are unintentional. ? ? ? ?Adin Hector, MD, FACS, MASCRS ?Esophageal, Gastrointestinal & Colorectal Surgery ?Robotic and Minimally Invasive Surgery ? ?[facility], [place] [address] ?[place], [place] [address] ?[phone] Fax ?[phone] Main ? ?CONTACT INFORMATION: ? ?Weekday (9AM-5PM): Call [facility] main office at [phone] ? ?Weeknight (5PM-9AM) or  Weekend/Holiday: Check [email or web address] (password " [email or web address]") for General Surgery [facility] coverage ? ?(Please, do not use SecureChat as it is not reliable communication to operating surgeons for immediate patient care) ?  ?

## 2021-12-03 LAB — BASIC METABOLIC PANEL
Anion gap: 6 (ref 5–15)
BUN: 9 mg/dL (ref 8–23)
CO2: 27 mmol/L (ref 22–32)
Calcium: 8.6 mg/dL — ABNORMAL LOW (ref 8.9–10.3)
Chloride: 102 mmol/L (ref 98–111)
Creatinine, Ser: 1.04 mg/dL (ref 0.61–1.24)
GFR, Estimated: >60 mL/min (ref >60)
Glucose, Bld: 122 mg/dL — ABNORMAL HIGH (ref 70–99)
Potassium: 3.9 mmol/L (ref 3.5–5.1)
Sodium: 135 mmol/L (ref 135–145)

## 2021-12-03 LAB — GLUCOSE, CAPILLARY: Glucose-Capillary: 266 mg/dL — ABNORMAL HIGH (ref 70–99)

## 2021-12-03 LAB — CBC
HCT: 35.4 % — ABNORMAL LOW (ref 39.0–52.0)
Hemoglobin: 11.7 g/dL — ABNORMAL LOW (ref 13.0–17.0)
MCH: 31.4 pg (ref 26.0–34.0)
MCHC: 33.1 g/dL (ref 30.0–36.0)
MCV: 94.9 fL (ref 80.0–100.0)
Platelets: 222 10*3/uL (ref 150–400)
RBC: 3.73 MIL/uL — ABNORMAL LOW (ref 4.22–5.81)
RDW: 13.7 % (ref 11.5–15.5)
WBC: 11.3 10*3/uL — ABNORMAL HIGH (ref 4.0–10.5)
nRBC: 0 % (ref 0.0–0.2)

## 2021-12-03 LAB — SURGICAL PATHOLOGY

## 2021-12-03 NOTE — Discharge Summary (Signed)
?Physician Discharge Summary  ? ? ?Patient ID: ?Devin Ellison ?MRN: [id number] ?DOB/AGE: 1956/03/21  ?66 y.o. ? ?Patient Care Team: ?London Pepper, MD as PCP - General (Family Medicine) ?Michael Boston, MD as Consulting Physician (General Surgery) ?Wilford Corner, MD as Consulting Physician (Gastroenterology) ?Holly Bodily, MD as Referring Physician (Gastroenterology) ?Lajean Manes, MD as Attending Physician (Internal Medicine) ? ?Admit date: 12/01/2021 ? ?Discharge date: 12/03/2021 ? ?Hospital Stay = 2 days ? ? ? ?Discharge Diagnoses:  ?Principal Problem: ?  Recurrent colon polyp s/p robotic colectomy 12/01/2021 ?Active Problems: ?  MDD (major depressive disorder), single episode ?  Essential hypertension ?  Bipolar affective disorder, current episode manic ([place]) ?  S/P partial colectomy ? ? ?2 Days Post-Op  12/01/2021 ? ?POST-OPERATIVE DIAGNOSIS:  ? ?recurrent ileocecal mass ? ?SURGERY:  12/01/2021 ? ?Procedure(s): ?ROBOTIC PROXIMAL COLECTOMY WITH LYSIS OF ADHESIONS AND BILATERAL TAP BLOCK ? ?SURGEON:   ? ?Surgeon(s): ?Michael Boston, MD ?Leighton Ruff, MD ? ?Consults: Case Management / Social Work ? ?Hospital Course:  ? ?The patient underwent the surgery above.  Postoperatively, the patient gradually mobilized and advanced to a solid diet.  Pain and other symptoms were treated aggressively.   ? ?By the time of discharge, the patient was walking well the hallways, eating food, having flatus.  Pain was well-controlled on an oral medications.  Based on meeting discharge criteria and continuing to recover, I felt it was safe for the patient to be discharged from the hospital to further recover with close followup. Postoperative recommendations were discussed in detail.  They are written as well. ? ?Discharged Condition: good ? ?Discharge Exam: ?Blood pressure 125/75, pulse 98, temperature 98.6 ?F (37 ?C), temperature source Oral, resp. rate 18, height 6' (1.829 m), weight 103.3 kg, SpO2 97 %. ? ?General: Pt  awake/alert/oriented x4 in No acute distress ?Eyes: PERRL, normal EOM.  Sclera clear.  No icterus ?Neuro: CN II-XII intact w/o focal sensory/motor deficits. ?Lymph: No head/neck/groin lymphadenopathy ?Psych:  No delerium/psychosis/paranoia ?HENT: Normocephalic, Mucus membranes moist.  No thrush ?Neck: Supple, No tracheal deviation ?Chest:  No chest wall pain w good excursion ?CV:  Pulses intact.  Regular rhythm ?MS: Normal AROM mjr joints.  No obvious deformity ?Abdomen: Soft.  Nondistended.  Nontender.  No evidence of peritonitis.  No incarcerated hernias. ?Ext:  SCDs BLE.  No mjr edema.  No cyanosis ?Skin: No petechiae / purpura ? ? ?Disposition:  ? ? Follow-up Information   ? ? Michael Boston, MD Follow up.   ?Specialties: General Surgery, Colon and Rectal Surgery ?Contact information: ?[place] ?[address] ?[place] [address] ?[phone] ? ? ?  ?  ? ?  ?  ? ?  ? ? ?Discharge disposition: 01-Home or Self Care ? ? ? ? ? ? ?Discharge Instructions   ? ? Call MD for:   Complete by: As directed ?  ? FEVER > 101.5 F  ?(temperatures < 101.5 F are not significant)  ? Call MD for:  extreme fatigue   Complete by: As directed ?  ? Call MD for:  persistant dizziness or light-headedness   Complete by: As directed ?  ? Call MD for:  persistant nausea and vomiting   Complete by: As directed ?  ? Call MD for:  redness, tenderness, or signs of infection (pain, swelling, redness, odor or green/yellow discharge around incision site)   Complete by: As directed ?  ? Call MD for:  severe uncontrolled pain   Complete by: As directed ?  ?  Diet - low sodium heart healthy   Complete by: As directed ?  ? Start with a bland diet such as soups, liquids, starchy foods, low fat foods, etc. the first few days at home. ?Gradually advance to a solid, low-fat, high fiber diet by the end of the first week at home.   ?Add a fiber supplement to your diet (Metamucil, etc) ?If you feel full, bloated, or constipated, stay on a full liquid or  pureed/blenderized diet for a few days until you feel better and are no longer constipated.  ? Discharge instructions   Complete by: As directed ?  ? See Discharge Instructions ?If you are not getting better after two weeks or are noticing you are getting worse, contact our office [phone] for further advice.  We may need to adjust your medications, re-evaluate you in the office, send you to the emergency room, or see what other things we can do to help. ?The clinic staff is available to answer your questions during regular business hours (8:30am-5pm).  Please don't hesitate to call and ask to speak to one of our nurses for clinical concerns.    ?A surgeon from [facility] is always on call at the hospitals 24 hours/day ?If you have a medical emergency, go to the nearest emergency room or call 911.  ? Discharge wound care:   Complete by: As directed ?  ? It is good for closed incisions and even open wounds to be washed every day.  Shower every day.  Short baths are fine.  Wash the incisions and wounds clean with soap & water.    ?You may leave closed incisions open to air if it is dry.   You may cover the incision with clean gauze & replace it after your daily shower for comfort. ? ?TEGADERM:  You have clear gauze band-aid dressings over your closed incision(s).  Remove the dressings 3 days after surgery.  ? Driving Restrictions   Complete by: As directed ?  ? You may drive when: ?- you are no longer taking narcotic prescription pain medication ?- you can comfortably wear a seatbelt ?- you can safely make sudden turns/stops without pain.  ? Increase activity slowly   Complete by: As directed ?  ? Start light daily activities --- self-care, walking, climbing stairs- beginning the day after surgery.  Gradually increase activities as tolerated.  Control your pain to be active.  Stop when you are tired.  Ideally, walk several times a day, eventually an hour a day.   ?Most people are back to most  day-to-day activities in a few weeks.  It takes 4-6 weeks to get back to unrestricted, intense activity. ?If you can walk 30 minutes without difficulty, it is safe to try more intense activity such as jogging, treadmill, bicycling, low-impact aerobics, swimming, etc. ?Save the most intensive and strenuous activity for last (Usually 4-8 weeks after surgery) such as sit-ups, heavy lifting, contact sports, etc.  Refrain from any intense heavy lifting or straining until you are off narcotics for pain control.  You will have off days, but things should improve week-by-week. ?DO NOT PUSH THROUGH PAIN.  Let pain be your guide: If it hurts to do something, don't do it.  ? Lifting restrictions   Complete by: As directed ?  ? If you can walk 30 minutes without difficulty, it is safe to try more intense activity such as jogging, treadmill, bicycling, low-impact aerobics, swimming, etc. ?Save the most intensive and strenuous  activity for last (Usually 4-8 weeks after surgery) such as sit-ups, heavy lifting, contact sports, etc.   ?Refrain from any intense heavy lifting or straining until you are off narcotics for pain control.  You will have off days, but things should improve week-by-week. ?DO NOT PUSH THROUGH PAIN.  Let pain be your guide: If it hurts to do something, don't do it.  Pain is your body warning you to avoid that activity for another week until the pain goes down.  ? May shower / Bathe   Complete by: As directed ?  ? May walk up steps   Complete by: As directed ?  ? Remove dressing in 72 hours   Complete by: As directed ?  ? Make sure all dressings are removed by the third day after surgery.  Leave incisions open to air.  OK to cover incisions with gauze or bandages as desired  ? Sexual Activity Restrictions   Complete by: As directed ?  ? You may have sexual intercourse when it is comfortable. If it hurts to do something, stop.  ? ?  ? ? ?Allergies as of 12/03/2021   ?No Known Allergies ?  ? ?  ?Medication List  ?   ? ?TAKE these medications   ? ?amLODipine 10 MG tablet ?Commonly known as: NORVASC ?Take 10 mg by mouth daily. ?  ?atorvastatin 10 MG tablet ?Commonly known as: LIPITOR ?Take 10 mg by mouth daily. ?  ?div

## 2021-12-03 NOTE — Plan of Care (Signed)
?  Problem: Education: ?Goal: Required Educational Video(s) ?Outcome: Adequate for Discharge ?  ?Problem: Clinical Measurements: ?Goal: Ability to maintain clinical measurements within normal limits will improve ?Outcome: Adequate for Discharge ?Goal: Postoperative complications will be avoided or minimized ?Outcome: Adequate for Discharge ?  ?Problem: Skin Integrity: ?Goal: Demonstration of wound healing without infection will improve ?Outcome: Adequate for Discharge ?  ?Problem: Education: ?Goal: Knowledge of General Education information will improve ?Description: Including pain rating scale, medication(s)/side effects and non-pharmacologic comfort measures ?Outcome: Adequate for Discharge ?  ?Problem: Health Behavior/Discharge Planning: ?Goal: Ability to manage health-related needs will improve ?Outcome: Adequate for Discharge ?  ?Problem: Clinical Measurements: ?Goal: Ability to maintain clinical measurements within normal limits will improve ?Outcome: Adequate for Discharge ?Goal: Will remain free from infection ?Outcome: Adequate for Discharge ?Goal: Diagnostic test results will improve ?Outcome: Adequate for Discharge ?Goal: Respiratory complications will improve ?Outcome: Adequate for Discharge ?Goal: Cardiovascular complication will be avoided ?Outcome: Adequate for Discharge ?  ?Problem: Activity: ?Goal: Risk for activity intolerance will decrease ?Outcome: Adequate for Discharge ?  ?Problem: Nutrition: ?Goal: Adequate nutrition will be maintained ?Outcome: Adequate for Discharge ?  ?Problem: Coping: ?Goal: Level of anxiety will decrease ?Outcome: Adequate for Discharge ?  ?Problem: Elimination: ?Goal: Will not experience complications related to bowel motility ?Outcome: Adequate for Discharge ?Goal: Will not experience complications related to urinary retention ?Outcome: Adequate for Discharge ?  ?Problem: Pain Managment: ?Goal: General experience of comfort will improve ?Outcome: Adequate for Discharge ?   ?Problem: Safety: ?Goal: Ability to remain free from injury will improve ?Outcome: Adequate for Discharge ?  ?Problem: Skin Integrity: ?Goal: Risk for impaired skin integrity will decrease ?Outcome: Adequate for Discharge ?  ?

## 2021-12-09 DIAGNOSIS — F3112 Bipolar disorder, current episode manic without psychotic features, moderate: Secondary | ICD-10-CM | POA: Diagnosis not present

## 2022-01-26 DIAGNOSIS — I10 Essential (primary) hypertension: Secondary | ICD-10-CM | POA: Diagnosis not present

## 2022-01-26 DIAGNOSIS — E1169 Type 2 diabetes mellitus with other specified complication: Secondary | ICD-10-CM | POA: Diagnosis not present

## 2022-01-26 DIAGNOSIS — Z0000 Encounter for general adult medical examination without abnormal findings: Secondary | ICD-10-CM | POA: Diagnosis not present

## 2022-01-26 DIAGNOSIS — Z125 Encounter for screening for malignant neoplasm of prostate: Secondary | ICD-10-CM | POA: Diagnosis not present

## 2022-01-26 DIAGNOSIS — Z136 Encounter for screening for cardiovascular disorders: Secondary | ICD-10-CM | POA: Diagnosis not present

## 2022-01-27 ENCOUNTER — Encounter: Payer: Self-pay | Admitting: Psychology

## 2022-01-27 DIAGNOSIS — I700 Atherosclerosis of aorta: Secondary | ICD-10-CM | POA: Insufficient documentation

## 2022-01-27 DIAGNOSIS — F3170 Bipolar disorder, currently in remission, most recent episode unspecified: Secondary | ICD-10-CM | POA: Insufficient documentation

## 2022-01-27 DIAGNOSIS — F411 Generalized anxiety disorder: Secondary | ICD-10-CM | POA: Insufficient documentation

## 2022-01-27 DIAGNOSIS — E669 Obesity, unspecified: Secondary | ICD-10-CM | POA: Insufficient documentation

## 2022-01-27 DIAGNOSIS — H9190 Unspecified hearing loss, unspecified ear: Secondary | ICD-10-CM | POA: Insufficient documentation

## 2022-01-28 ENCOUNTER — Ambulatory Visit: Payer: Medicare Other

## 2022-01-28 ENCOUNTER — Ambulatory Visit (INDEPENDENT_AMBULATORY_CARE_PROVIDER_SITE_OTHER): Payer: Medicare Other | Admitting: Psychology

## 2022-01-28 ENCOUNTER — Encounter: Payer: Self-pay | Admitting: Psychology

## 2022-01-28 DIAGNOSIS — F3170 Bipolar disorder, currently in remission, most recent episode unspecified: Secondary | ICD-10-CM

## 2022-01-28 DIAGNOSIS — R4189 Other symptoms and signs involving cognitive functions and awareness: Secondary | ICD-10-CM | POA: Diagnosis not present

## 2022-01-28 DIAGNOSIS — F332 Major depressive disorder, recurrent severe without psychotic features: Secondary | ICD-10-CM | POA: Diagnosis not present

## 2022-01-28 DIAGNOSIS — G4733 Obstructive sleep apnea (adult) (pediatric): Secondary | ICD-10-CM | POA: Insufficient documentation

## 2022-01-28 DIAGNOSIS — F411 Generalized anxiety disorder: Secondary | ICD-10-CM | POA: Diagnosis not present

## 2022-01-28 NOTE — Progress Notes (Signed)
   Psychometrician Note   Cognitive testing was administered to Devin Ellison by Cruzita Lederer, B.S. (psychometrist) under the supervision of Dr. Christia Reading, Ph.D., licensed psychologist on 01/28/2022. Mr. Devin Ellison did not appear overtly distressed by the testing session per behavioral observation or responses across self-report questionnaires. Rest breaks were offered.    The battery of tests administered was selected by Dr. Christia Reading, Ph.D. with consideration to Devin Ellison current level of functioning, the nature of his symptoms, emotional and behavioral responses during interview, level of literacy, observed level of motivation/effort, and the nature of the referral question. This battery was communicated to the psychometrist. Communication between Dr. Christia Reading, Ph.D. and the psychometrist was ongoing throughout the evaluation and Dr. Christia Reading, Ph.D. was immediately accessible at all times. Dr. Christia Reading, Ph.D. provided supervision to the psychometrist on the date of this service to the extent necessary to assure the quality of all services provided.    Devin Ellison will return within approximately 1-2 weeks for an interactive feedback session with Dr. Melvyn Ellison at which time his test performances, clinical impressions, and treatment recommendations will be reviewed in detail. Devin Ellison understands he can contact our office should he require our assistance before this time.  A total of 120 minutes of billable time were spent face-to-face with Mr. Devin Ellison by the psychometrist. This includes both test administration and scoring time. Billing for these services is reflected in the clinical report generated by Dr. Christia Reading, Ph.D.  This note reflects time spent with the psychometrician and does not include test scores or any clinical interpretations made by Dr. Melvyn Ellison. The full report will follow in a separate note.

## 2022-01-28 NOTE — Progress Notes (Signed)
NEUROPSYCHOLOGICAL EVALUATION [place]. [facility] Department of Neurology  Date of Evaluation: Jan 28, 2022  Reason for Referral:   Devin Ellison is a 66 y.o. left-handed Caucasian male referred by  London Pepper, M.D. , to characterize his current cognitive functioning and assist with diagnostic clarity and treatment planning in the context of subjective cognitive decline and a history of bipolar disorder.   Assessment and Plan:   Clinical Impression(s): Devin Ellison' pattern of performance is suggestive of neuropsychological functioning within normal limits relative to age-matched peers. Devin Ellison did exhibit a few isolated relative weaknesses (i.e., below average range), primarily surrounding executive functioning and phonemic fluency. A below average performance was also seen across a daily living memory task; however, three other memory tasks were strong and thus not cause for concern. Isolated lower performances may be caused by normal intraindividual variability, longstanding patterns of strength and weakness, and the influence of ongoing psychiatric distress (see below) and sleep dysfunction. No consistent impairments were exhibited across any cognitive domain. Devin Ellison denied difficulties completing instrumental activities of daily living (ADLs) independently.   Devin Ellison denied any ongoing subjective cognitive impairment during interview, so results of current testing would be in agreement with his perception of functioning. It is worth highlighting that his psychiatric history is likely playing an active role in day-to-day performances. Across mood-related questionnaires, he reported acute symptoms of mild anxiety and severe depression. Psychiatric distress, especially as it becomes more severe, will certainly affect cognitive functioning, especially across aspects of attention/concentration and executive functioning. Similarly, individuals with a history of  bipolar disorder also commonly have subtle to mild deficits in executive functioning even when not in the midst of a manic episode. Essentially, psychiatric symptoms decrease the efficiency at which Devin Ellison is able to process, attend to, and quickly integrate and organize new information. These difficulties become more apparent when attempting to multi-task or when dealing with more complex cognitive demands. These difficulties would further be exacerbated by ongoing sleep dysfunction and untreated sleep apnea.   Specific to memory, Devin Ellison was able to learn novel verbal and visual information efficiently and retain this knowledge after lengthy delays. Overall, memory performance combined with intact performances across other areas of cognitive functioning is not suggestive of Alzheimer's disease. Likewise, his cognitive and behavioral profile is not suggestive of any other form of neurodegenerative illness presently.  Recommendations: Should Devin Ellison express concern surrounding cognitive decline in the future, a repeat evaluation could be considered at that time. The current evaluation will serve as an excellent baseline for any future comparisons.  Untreated sleep apnea will worsen sleep dysfunction and can compromise cognitive efficiency. If left untreated, it will double his risk for stroke and additionally increase his risk for heart attack and dementia in the future. He is strongly encouraged to adhere to CPAP recommendations to treat this underlying condition. He could also consider meeting with a sleep specialist to discuss alternative means of treatment.   A combination of medication and psychotherapy has been shown to be most effective at treating symptoms of anxiety and depression. As such, Devin Ellison is encouraged to continue working with his prescribing physician regarding medication adjustments to optimally manage these symptoms.   Likewise, Devin Ellison is encouraged to continue  ongoing engagement with short-term psychotherapy to address symptoms of psychiatric distress. He would benefit from an active and collaborative therapeutic environment, rather than one purely supportive in nature. Recommended treatment modalities include Cognitive Behavioral Therapy (CBT) or  Acceptance and Commitment Therapy (ACT).  Devin Ellison is encouraged to attend to lifestyle factors for brain health (e.g., regular physical exercise, good nutrition habits, regular participation in cognitively-stimulating activities, and general stress management techniques), which are likely to have benefits for both emotional adjustment and cognition. In fact, in addition to promoting good general health, regular exercise incorporating aerobic activities (e.g., brisk walking, jogging, cycling, etc.) has been demonstrated to be a very effective treatment for depression and stress, with similar efficacy rates to both antidepressant medication and psychotherapy. Optimal control of vascular risk factors (including safe cardiovascular exercise and adherence to dietary recommendations) is encouraged. Continued participation in activities which provide mental stimulation and social interaction is also recommended.   Memory can be improved using internal strategies such as rehearsal, repetition, chunking, mnemonics, association, and imagery. External strategies such as written notes in a consistently used memory journal, visual and nonverbal auditory cues such as a calendar on the refrigerator or appointments with alarm, such as on a cell phone, can also help maximize recall.    To address problems with fluctuating attention, he may wish to consider:   -Avoiding external distractions when needing to concentrate   -Limiting exposure to fast paced environments with multiple sensory demands   -Writing down complicated information and using checklists   -Attempting and completing one task at a time (i.e., no multi-tasking)    -Verbalizing aloud each step of a task to maintain focus   -Reducing the amount of information considered at one time  Review of Records:   Mr. Paxton was previously seen by [facility] Wells Guiles Tat, D.O.) on 08/13/2018 for an evaluation of ongoing tremors. At that time, Dr. Carles Collet felt that tremors were due to medication side effects stemming from chronic lithium use. He was encouraged to discuss this with his psychiatrist. Dr. Carles Collet did not find evidence to suggest concern for Parkinson's disease.   Mr. Peasley presented to behavioral health due to emerging manic symptoms on 05/08/2021. Briefly, his wife informed him that she was leaving him and had filed for separation. His family described ongoing symptoms surrounding Mr. Morissette being overly talkative. He described racing thoughts, decreased sleep, crying spells, and irritability. He reported breaking several mugs, dishes, and a glass table given ongoing frustration. There was also report of excessive spending and excessive alcohol consumption (8-10 cans of beer daily). When meeting with a counselor on 06/30/2021, he noted an added stressor surrounding the passing of his mother. Some psychotic symptoms emerged, primarily surrounding him believing that he was able to pass through solid matter. This resulted in him scraping several cars while attempting to park his own car due to his belief that he would pass through them. He was voluntarily admitted for psychiatric treatment and stabilized.   More recently, Mr. Craton was seen by his PCP London Pepper, M.D.) on 07/14/2021 for follow-up. Documentation from this appointment was provided. However, while the referral for cognitive testing originated from this appointment, I was unable to find any details surrounding reported memory loss or ongoing concerns. It my assumption (and the assumption of this evaluation) that Mr. Bertram was referred for a comprehensive neuropsychological evaluation to characterize  his cognitive abilities and to assist with diagnostic clarity and treatment planning.   No neuroimaging was available for review.   Past Medical History:  Diagnosis Date   Bipolar I disorder in remission    Last Assessment & Plan:  Patient voluntarily presents for evaluation citing increasingly reckless behavior over the past week, initially  diagnosed with bipolar disorder 2014 has been managed on lamotrigine, Seroquel, stated he does not want to wake up in the morning but denies any plans -Evaluated by psychiatry in ED, IVC'd -Seroquel 300 mg   CKD (chronic kidney disease) 05/16/2021   Last Assessment & Plan: Renal fxn stable   Dermatitis of left ear canal 10/14/2018   Essential hypertension 03/13/2017   Gastroesophageal reflux disease without esophagitis 05/20/2021   Generalized anxiety disorder    Hardening of the aorta (main artery of the heart)    Hearing loss    Hyperlipidemia 05/20/2021   Major depressive disorder 04/30/2013   Obstructive sleep apnea    No CPAP use   Recurrent colon polyp s/p robotic colectomy 04/11/2016   Type II diabetes mellitus, noninsulin dependent 05/16/2021   Last Assessment & Plan: Restart on metformin at '500mg'$  bid, titrate -stable    Past Surgical History:  Procedure Laterality Date   ANKLE SURGERY Right    COLON SURGERY     polypectomy   HAND SURGERY Right    after fx   WISDOM TOOTH EXTRACTION      Current Outpatient Medications:    amLODipine (NORVASC) 10 MG tablet, Take 10 mg by mouth daily., Disp: , Rfl:    atorvastatin (LIPITOR) 10 MG tablet, Take 10 mg by mouth daily., Disp: , Rfl:    divalproex (DEPAKOTE ER) 500 MG 24 hr tablet, Take 2,000 mg by mouth daily., Disp: , Rfl:    lamoTRIgine (LAMICTAL) 100 MG tablet, Take 100 mg by mouth 2 (two) times daily., Disp: , Rfl:    losartan (COZAAR) 100 MG tablet, TAKE 1 TABLET BY MOUTH EVERY DAY, Disp: 30 tablet, Rfl: 0   lurasidone (LATUDA) 20 MG TABS tablet, Take 20 mg by mouth every evening.,  Disp: , Rfl:    metFORMIN (GLUCOPHAGE) 1000 MG tablet, Take 1,000 mg by mouth 2 (two) times daily with a meal., Disp: , Rfl:    traMADol (ULTRAM) 50 MG tablet, Take 1-2 tablets (50-100 mg total) by mouth every 6 (six) hours as needed for moderate pain or severe pain., Disp: 20 tablet, Rfl: 0  Clinical Interview:   The following information was obtained during a clinical interview with Mr. John prior to cognitive testing.  Cognitive Symptoms: Decreased short-term memory: Denied. He acknowledged that his memory was "not as good as it used to be" but he denied this being a stark contrast and ultimately did not describe strong concerns surrounding memory decline. He acknowledged that his family may have greater concerns but was unable to elaborate on what those may be.  Decreased long-term memory: Denied. Decreased attention/concentration: Endorsed. He described somewhat longstanding trouble with sustained attention and distractibility. This does seem to have worsened over the past few years. He noted that he is less able to read for lengthy periods of time without getting distracted.  Reduced processing speed: Denied. Difficulties with executive functions: Endorsed. He described somewhat longstanding trouble with multi-tasking, organization, and decision making. Outside of manic episodes, he did not describe trouble with impulsivity or significant personality changes.  Difficulties with emotion regulation: Denied. Difficulties with receptive language: Denied. Difficulties with word finding: Denied. Decreased visuoperceptual ability: Denied.  Difficulties completing ADLs: Denied.  Additional Medical History: History of traumatic brain injury/concussion: Denied. History of stroke: Denied. History of seizure activity: Denied. History of known exposure to toxins: Denied. Symptoms of chronic pain: Denied. Experience of frequent headaches/migraines: Denied. Frequent instances of  dizziness/vertigo: Denied.  Sensory changes: He wears glasses with  benefit. He also reported mild hearing loss, particularly while in groups or louder social settings. He emphasized that if he is unable to face the speaker directly, he will have trouble adequately hearing them. Other sensory changes/difficulties (e.g., taste or smell) were denied.  Balance/coordination difficulties: Denied. Other motor difficulties: Denied. Since discontinuing lithium, he reported that upper extremity tremors have resolved.   Sleep History: Estimated hours obtained each night: He described his sleep as "terrible," estimating that he may get four hours during the night. He stated that he will sleep off and on during the day given his current sedentary lifestyle with perhaps some ongoing isolation.  Difficulties falling asleep: Endorsed. Difficulties staying asleep: Endorsed. Feels rested and refreshed upon awakening: Variably so.   History of snoring: Endorsed. History of waking up gasping for air: Endorsed. Witnessed breath cessation while asleep: Endorsed. He reported a history of obstructive sleep apnea and trialing a CPAP machine in the past. However, he was unable to tolerate this device and ultimately stopped its use.   History of vivid dreaming: Endorsed. Excessive movement while asleep: Denied. Instances of acting out his dreams: Denied.  Psychiatric/Behavioral Health History: Depression: He described his current mood as depressed, stating that "nothing makes me happy" and that "nothing satisfies me." He is in between jobs and did report that if his current lead surrounding potential employment goes well, this will greatly improve his mood. There is a long history of depressive symptoms associated with his bipolar disorder. Current suicidal ideation, intent, or plan was largely denied. He did note some mornings waking and wishing that he had not woken up.   Anxiety: Endorsed. He reported longstanding  anxiety which has become quite severe at times.  Mania: Endorsed. He described a history of two significant manic episodes associated with his bipolar I disorder diagnosis (2014 and 2022, the latter of which is mentioned above). He is currently followed by a psychiatrist and therapist, both of which he describes as excellent. He noted that current bipolar-related psychiatric symptoms were being managed "okay."  Trauma History: Denied. Visual/auditory hallucinations: Denied outside of manic episodes.  Delusional thoughts: Denied outside of manic episodes.   Tobacco: Endorsed. He reported smoking about 1/2 pack of cigarettes daily.  Alcohol: He denied current alcohol consumption. He did report prior excessive alcohol abuse, particularly surrounding some legal/financial issues which arose around 2008. He noted that the stress surrounding these issues greatly impacted his sleep, causing him to drink alcohol in excess to help him fall asleep.  Recreational drugs: Denied.  Family History: Problem Relation Age of Onset   Heart disease Mother    Cerebral palsy Sister 65   Leukemia Sister    Breast cancer Sister    Depression Paternal Uncle    Bipolar disorder Paternal Uncle    Suicidality Paternal Aunt    This information was confirmed by Mr. Eckert.  Academic/Vocational History: Highest level of educational attainment: 19 years. He earned a Conservation officer, historic buildings (JD) and described himself as a Production designer, theatre/television/film in academic settings. While no relative weaknesses were identified, he did note that he had to apply himself a bit harder than some of his classmates. However, in doing so, he was able to [place] in many settings.  History of developmental delay: Denied. History of grade repetition: Denied. Enrollment in special education courses: Denied. History of LD/ADHD: Denied.  Employment: Unemployed. 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 He is in the process of applying for new employment positions.   Evaluation Results:   Behavioral Observations: Mr. Signorelli was unaccompanied, arrived to his appointment on time, and was appropriately dressed and groomed. He appeared alert and oriented. Observed gait and station were within normal limits. Gross motor functioning appeared intact upon informal observation and no abnormal movements (e.g., tremors) were noted. His affect was positive and expansive, but did range appropriately given the subject being discussed during the clinical interview or the task at hand during testing procedures. Spontaneous speech was fluent and word finding difficulties were not observed during the clinical interview. Thought processes were coherent, organized, and normal in content. Insight into his cognitive difficulties appeared adequate.   During testing, sustained attention was appropriate. Task engagement was adequate and he persisted when challenged. Overall, Mr. Devita was cooperative with the clinical interview and subsequent testing procedures.   Adequacy of Effort: The validity of neuropsychological testing is limited by the extent to which the individual being tested may be assumed to have exerted adequate effort during testing. Mr. Duris expressed his intention to perform to the best of his abilities and exhibited adequate task engagement and persistence. Scores across stand-alone and embedded performance validity measures were within expectation. As such, the results of the current evaluation are believed to be a valid representation of Mr. Torrez' current cognitive functioning.  Test Results: Mr. Engelbert was fully  oriented at the time of the current evaluation.  Intellectual abilities based upon educational and vocational attainment were estimated to be in the above average range. Premorbid abilities were estimated to be within the above average range based upon a single-word reading test.   Processing speed was above average to well above average. Basic attention was above average. More complex attention (e.g., working memory) was also above average. Executive functioning was mildly variable but largely average to well above average. He did have a few below average performances across an unstructured task assessing concept formation and adaptability.   While not directly assessed, receptive language abilities were believed to be intact. Mr. Magoon did not exhibit any difficulties comprehending task instructions and answered all questions asked of him appropriately. Across expressive language, phonemic fluency was below average to average, semantic fluency was exceptionally high, and confrontation naming was average.    Assessed visuospatial/visuoconstructional abilities were average to well above average.    Learning (i.e., encoding) of novel verbal and visual information was average to well above average. Spontaneous delayed recall (i.e., retrieval) of previously learned information was below average to average. Retention rates were 94% across a story learning task, 114% across a list learning task, and 71% across a shape learning task. Performance across recognition tasks was well below average on a daily living task (however, he only missed three total items) but above average to well above average across two other tasks, suggesting evidence for information consolidation.   Results of emotional screening instruments suggested that recent symptoms of generalized anxiety were in the mild range, while symptoms of depression were within the severe range. A screening instrument assessing recent sleep quality  suggested the presence of moderate sleep dysfunction.  Tables of Scores:   Note: This summary of test scores accompanies the interpretive report and should not  be considered in isolation without reference to the appropriate sections in the text. Descriptors are based on appropriate normative data and may be adjusted based on clinical judgment. Terms such as "Within Normal Limits" and "Outside Normal Limits" are used when a more specific description of the test score cannot be determined.       Percentile - Normative Descriptor > 98 - Exceptionally High 91-97 - Well Above Average 75-90 - Above Average 25-74 - Average 9-24 - Below Average 2-8 - Well Below Average < 2 - Exceptionally Low       Validity:   DESCRIPTOR       ACS Word Choice: --- --- Within Normal Limits  Dot Counting Test: --- --- Within Normal Limits  NAB EVI: --- --- Within Normal Limits       Orientation:      Raw Score Percentile   NAB Orientation, Form 1 29/29 --- ---       Cognitive Screening:      Raw Score Percentile   SLUMS: 28/30 --- ---       Intellectual Functioning:      Standard Score Percentile   Test of Premorbid Functioning: 119 90 Above Average       Memory:     NAB Memory Module, Form 1: Standard Score/ T Score Percentile   Total Memory Index 98 45 Average  List Learning       Total Trials 1-3 21/36 (45) 31 Average    List B 4/12 (47) 38 Average    Short Delay Free Recall 7/12 (48) 42 Average    Long Delay Free Recall 8/12 (54) 66 Average    Retention Percentage 114 (56) 73 Average    Recognition Discriminability 12 (63) 38 Well Above Average  Shape Learning       Total Trials 1-3 14/27 (45) 31 Average    Delayed Recall 5/9 (45) 31 Average    Retention Percentage 71 (42) 21 Below Average    Recognition Discriminability 8 (57) 75 Above Average  Story Learning       Immediate Recall 66/80 (55) 69 Average    Delayed Recall 33/40 (51) 54 Average    Retention Percentage 94 (53) 62 Average   Daily Living Memory       Immediate Recall 48/51 (64) 92 Well Above Average    Delayed Recall 12/17 (40) 16 Below Average    Retention Percentage 71 (38) 12 Below Average    Recognition Hits 7/10 (36) 8 Well Below Average       Attention/Executive Function:     Trail Making Test (TMT): Raw Score (T Score) Percentile     Part A 19 secs.,  0 errors (66) 95 Well Above Average    Part B 47 secs.,  0 errors (64) 92 Well Above Average         Scaled Score Percentile   WAIS-IV Coding: 12 75 Above Average       NAB Attention Module, Form 1: T Score Percentile     Digits Forward 62 88 Above Average    Digits Backwards 58 79 Above Average       D-KEFS Color-Word Interference Test: Raw Score (Scaled Score) Percentile     Color Naming 28 secs. (12) 75 Above Average    Word Reading 20 secs. (12) 75 Above Average    Inhibition 56 secs. (12) 75 Above Average      Total Errors 3 errors (9) 37 Average    Inhibition/Switching 60 secs. (  12) 75 Above Average      Total Errors 3 errors (10) 50 Average       D-KEFS Verbal Fluency Test: Raw Score (Scaled Score) Percentile     Letter Total Correct 31 (9) 37 Average    Category Total Correct 49 (16) 98 Exceptionally High    Category Switching Total Correct 15 (13) 84 Above Average    Category Switching Accuracy 14 (13) 84 Above Average      Total Set Loss Errors 2 (10) 50 Average      Total Repetition Errors 0 (13) 84 Above Average       [employer] Test: Raw Score Percentile     Categories (trials) 3 (64) >16 Within Normal Limits    Total Errors 20 18 Below Average    Perseverative Errors 10 25 Average    Non-Perseverative Errors 10 18 Below Average    Failure to Maintain Set 0 --- ---       Language:     Verbal Fluency Test: Raw Score (T Score) Percentile     Phonemic Fluency (FAS) 31 (39) 14 Below Average    Animal Fluency 32 (73) 99 Exceptionally High        NAB Language Module, Form 1: T Score Percentile     Naming 31/31  (55) 69 Average       Visuospatial/Visuoconstruction:      Raw Score Percentile   Clock Drawing: 10/10 --- Within Normal Limits       NAB Spatial Module, Form 1: T Score Percentile     Figure Drawing Copy 56 73 Average        Scaled Score Percentile   WAIS-IV Block Design: 14 91 Well Above Average  WAIS-IV Matrix Reasoning: 14 91 Well Above Average       Mood and Personality:      Raw Score Percentile   Beck Depression Inventory - II: 35 --- Severe  PROMIS Anxiety Questionnaire: 16 --- Mild       Additional Questionnaires:      Raw Score Percentile   PROMIS Sleep Disturbance Questionnaire: 32 --- Moderate   Informed Consent and Coding/Compliance:   The current evaluation represents a clinical evaluation for the purposes previously outlined by the referral source and is in no way reflective of a forensic evaluation.   Mr. Goswami was provided with a verbal description of the nature and purpose of the present neuropsychological evaluation. Also reviewed were the foreseeable risks and/or discomforts and benefits of the procedure, limits of confidentiality, and mandatory reporting requirements of this provider. The patient was given the opportunity to ask questions and receive answers about the evaluation. Oral consent to participate was provided by the patient.   This evaluation was conducted by Christia Reading, Ph.D., ABPP-CN, board certified clinical neuropsychologist. Mr. Rudd completed a clinical interview with Dr. Melvyn Novas, billed as one unit [phone], and 120 minutes of cognitive testing and scoring, billed as one unit [phone] and three additional units [id number]. Psychometrist Cruzita Lederer, B.S., assisted Dr. Melvyn Novas with test administration and scoring procedures. As a separate and discrete service, Dr. Melvyn Novas spent a total of 160 minutes in interpretation and report writing billed as one unit [phone] and two units [id number].

## 2022-02-03 DIAGNOSIS — Z7189 Other specified counseling: Secondary | ICD-10-CM | POA: Diagnosis not present

## 2022-02-03 DIAGNOSIS — S61209A Unspecified open wound of unspecified finger without damage to nail, initial encounter: Secondary | ICD-10-CM | POA: Diagnosis not present

## 2022-02-03 DIAGNOSIS — S0512XA Contusion of eyeball and orbital tissues, left eye, initial encounter: Secondary | ICD-10-CM | POA: Diagnosis not present

## 2022-02-04 ENCOUNTER — Ambulatory Visit (INDEPENDENT_AMBULATORY_CARE_PROVIDER_SITE_OTHER): Payer: HMO | Admitting: Psychology

## 2022-02-04 DIAGNOSIS — F411 Generalized anxiety disorder: Secondary | ICD-10-CM

## 2022-02-04 DIAGNOSIS — F3170 Bipolar disorder, currently in remission, most recent episode unspecified: Secondary | ICD-10-CM

## 2022-02-04 DIAGNOSIS — R4189 Other symptoms and signs involving cognitive functions and awareness: Secondary | ICD-10-CM

## 2022-02-04 DIAGNOSIS — F332 Major depressive disorder, recurrent severe without psychotic features: Secondary | ICD-10-CM | POA: Diagnosis not present

## 2022-02-04 NOTE — Progress Notes (Signed)
   Neuropsychology Feedback Session Devin Ellison. [facility] Department of Neurology  Reason for Referral:   Devin Ellison is a 66 y.o. left-handed Caucasian male referred by  London Pepper, M.D. , to characterize his current cognitive functioning and assist with diagnostic clarity and treatment planning in the context of subjective cognitive decline and a history of bipolar disorder.   Feedback:   Devin Ellison completed a comprehensive neuropsychological evaluation on 01/28/2022. Please refer to that encounter for the full report and recommendations. Briefly, results suggested neuropsychological functioning within normal limits relative to age-matched peers. Devin Ellison did exhibit a few isolated relative weaknesses (i.e., below average range), primarily surrounding executive functioning and phonemic fluency. A below average performance was also seen across a daily living memory task; however, three other memory tasks were strong and thus not cause for concern. It is worth highlighting that his psychiatric history is likely playing an active role in day-to-day performances. Across mood-related questionnaires, he reported acute symptoms of mild anxiety and severe depression. Psychiatric distress, especially as it becomes more severe, will certainly affect cognitive functioning, especially across aspects of attention/concentration and executive functioning. Similarly, individuals with a history of bipolar disorder also commonly have subtle to mild deficits in executive functioning even when not in the midst of a manic episode. Essentially, psychiatric symptoms decrease the efficiency at which Devin Ellison is able to process, attend to, and quickly integrate and organize new information. These difficulties become more apparent when attempting to multi-task or when dealing with more complex cognitive demands. These difficulties would further be exacerbated by ongoing sleep dysfunction and  untreated sleep apnea.   Devin Ellison was accompanied by his wife during the current feedback session. Content of the current session focused on the results of his neuropsychological evaluation. Devin Ellison was given the opportunity to ask questions and his questions were answered. He was encouraged to reach out should additional questions arise. A copy of his report was provided at the conclusion of the visit.      20 minutes were spent conducting the current feedback session with Devin Ellison, billed as one unit [phone].

## 2022-02-07 ENCOUNTER — Encounter: Payer: Medicare Other | Admitting: Psychology

## 2022-03-11 DIAGNOSIS — G4733 Obstructive sleep apnea (adult) (pediatric): Secondary | ICD-10-CM | POA: Diagnosis not present

## 2022-04-20 DIAGNOSIS — G4733 Obstructive sleep apnea (adult) (pediatric): Secondary | ICD-10-CM | POA: Diagnosis not present

## 2022-04-20 DIAGNOSIS — E1169 Type 2 diabetes mellitus with other specified complication: Secondary | ICD-10-CM | POA: Diagnosis not present

## 2022-04-28 DIAGNOSIS — Z720 Tobacco use: Secondary | ICD-10-CM | POA: Diagnosis not present

## 2022-04-28 DIAGNOSIS — F319 Bipolar disorder, unspecified: Secondary | ICD-10-CM | POA: Diagnosis not present

## 2022-04-28 DIAGNOSIS — E1169 Type 2 diabetes mellitus with other specified complication: Secondary | ICD-10-CM | POA: Diagnosis not present

## 2022-04-28 DIAGNOSIS — I10 Essential (primary) hypertension: Secondary | ICD-10-CM | POA: Diagnosis not present

## 2022-04-28 DIAGNOSIS — G4733 Obstructive sleep apnea (adult) (pediatric): Secondary | ICD-10-CM | POA: Diagnosis not present

## 2022-09-08 DIAGNOSIS — J208 Acute bronchitis due to other specified organisms: Secondary | ICD-10-CM | POA: Diagnosis not present

## 2022-09-30 DIAGNOSIS — R059 Cough, unspecified: Secondary | ICD-10-CM | POA: Diagnosis not present

## 2022-09-30 DIAGNOSIS — I10 Essential (primary) hypertension: Secondary | ICD-10-CM | POA: Diagnosis not present

## 2022-09-30 DIAGNOSIS — Z720 Tobacco use: Secondary | ICD-10-CM | POA: Diagnosis not present

## 2022-09-30 DIAGNOSIS — F319 Bipolar disorder, unspecified: Secondary | ICD-10-CM | POA: Diagnosis not present

## 2022-09-30 DIAGNOSIS — E1169 Type 2 diabetes mellitus with other specified complication: Secondary | ICD-10-CM | POA: Diagnosis not present

## 2022-09-30 DIAGNOSIS — G4733 Obstructive sleep apnea (adult) (pediatric): Secondary | ICD-10-CM | POA: Diagnosis not present

## 2022-09-30 DIAGNOSIS — I700 Atherosclerosis of aorta: Secondary | ICD-10-CM | POA: Diagnosis not present

## 2022-11-05 ENCOUNTER — Encounter (HOSPITAL_COMMUNITY): Payer: Self-pay | Admitting: Emergency Medicine

## 2022-11-05 ENCOUNTER — Ambulatory Visit (HOSPITAL_COMMUNITY)
Admission: EM | Admit: 2022-11-05 | Discharge: 2022-11-07 | Disposition: A | Discharge status: Home / Self Care | Payer: PPO | Attending: Family | Admitting: Family

## 2022-11-05 DIAGNOSIS — R45850 Homicidal ideations: Secondary | ICD-10-CM | POA: Insufficient documentation

## 2022-11-05 DIAGNOSIS — Z1152 Encounter for screening for COVID-19: Secondary | ICD-10-CM | POA: Insufficient documentation

## 2022-11-05 DIAGNOSIS — F319 Bipolar disorder, unspecified: Secondary | ICD-10-CM | POA: Diagnosis not present

## 2022-11-05 DIAGNOSIS — F3110 Bipolar disorder, current episode manic without psychotic features, unspecified: Secondary | ICD-10-CM | POA: Diagnosis not present

## 2022-11-05 DIAGNOSIS — Z7984 Long term (current) use of oral hypoglycemic drugs: Secondary | ICD-10-CM | POA: Insufficient documentation

## 2022-11-05 DIAGNOSIS — E119 Type 2 diabetes mellitus without complications: Secondary | ICD-10-CM | POA: Diagnosis not present

## 2022-11-05 DIAGNOSIS — F1994 Other psychoactive substance use, unspecified with psychoactive substance-induced mood disorder: Secondary | ICD-10-CM | POA: Insufficient documentation

## 2022-11-05 DIAGNOSIS — F1490 Cocaine use, unspecified, uncomplicated: Secondary | ICD-10-CM | POA: Diagnosis not present

## 2022-11-05 DIAGNOSIS — R454 Irritability and anger: Secondary | ICD-10-CM | POA: Insufficient documentation

## 2022-11-05 DIAGNOSIS — I10 Essential (primary) hypertension: Secondary | ICD-10-CM | POA: Diagnosis not present

## 2022-11-05 DIAGNOSIS — Z794 Long term (current) use of insulin: Secondary | ICD-10-CM | POA: Diagnosis not present

## 2022-11-05 LAB — COMPREHENSIVE METABOLIC PANEL
ALT: 17 U/L (ref 0–44)
AST: 24 U/L (ref 15–41)
Albumin: 3.9 g/dL (ref 3.5–5.0)
Alkaline Phosphatase: 105 U/L (ref 38–126)
Anion gap: 10 (ref 5–15)
BUN: 14 mg/dL (ref 8–23)
CO2: 24 mmol/L (ref 22–32)
Calcium: 8.8 mg/dL — ABNORMAL LOW (ref 8.9–10.3)
Chloride: 103 mmol/L (ref 98–111)
Creatinine, Ser: 0.92 mg/dL (ref 0.61–1.24)
GFR, Estimated: >60 mL/min (ref >60)
Glucose, Bld: 216 mg/dL — ABNORMAL HIGH (ref 70–99)
Potassium: 3.6 mmol/L (ref 3.5–5.1)
Sodium: 137 mmol/L (ref 135–145)
Total Bilirubin: 0.8 mg/dL (ref 0.3–1.2)
Total Protein: 6.5 g/dL (ref 6.5–8.1)

## 2022-11-05 LAB — TSH: TSH: 1.133 u[IU]/mL (ref 0.350–4.500)

## 2022-11-05 LAB — CBC WITH DIFFERENTIAL/PLATELET
Abs Immature Granulocytes: 0.06 10*3/uL (ref 0.00–0.07)
Basophils Absolute: 0.1 10*3/uL (ref 0.0–0.1)
Basophils Relative: 0 %
Eosinophils Absolute: 0.2 10*3/uL (ref 0.0–0.5)
Eosinophils Relative: 1 %
HCT: 42.9 % (ref 39.0–52.0)
Hemoglobin: 15.1 g/dL (ref 13.0–17.0)
Immature Granulocytes: 1 %
Lymphocytes Relative: 22 %
Lymphs Abs: 2.5 10*3/uL (ref 0.7–4.0)
MCH: 31.4 pg (ref 26.0–34.0)
MCHC: 35.2 g/dL (ref 30.0–36.0)
MCV: 89.2 fL (ref 80.0–100.0)
Monocytes Absolute: 1 10*3/uL (ref 0.1–1.0)
Monocytes Relative: 9 %
Neutro Abs: 7.4 10*3/uL (ref 1.7–7.7)
Neutrophils Relative %: 67 %
Platelets: 245 10*3/uL (ref 150–400)
RBC: 4.81 MIL/uL (ref 4.22–5.81)
RDW: 12.6 % (ref 11.5–15.5)
WBC: 11.1 10*3/uL — ABNORMAL HIGH (ref 4.0–10.5)
nRBC: 0 % (ref 0.0–0.2)

## 2022-11-05 LAB — LIPID PANEL
Cholesterol: 178 mg/dL (ref 0–200)
HDL: 54 mg/dL (ref >40)
LDL Cholesterol: 98 mg/dL (ref 0–99)
Total CHOL/HDL Ratio: 3.3 RATIO
Triglycerides: 132 mg/dL (ref <150)
VLDL: 26 mg/dL (ref 0–40)

## 2022-11-05 LAB — POCT URINE DRUG SCREEN - MANUAL ENTRY (I-SCREEN)
POC Amphetamine UR: NOT DETECTED
POC Buprenorphine (BUP): NOT DETECTED
POC Cocaine UR: POSITIVE — AB
POC Marijuana UR: POSITIVE — AB
POC Methadone UR: NOT DETECTED
POC Methamphetamine UR: NOT DETECTED
POC Morphine: NOT DETECTED
POC Oxazepam (BZO): NOT DETECTED
POC Oxycodone UR: NOT DETECTED
POC Secobarbital (BAR): NOT DETECTED

## 2022-11-05 LAB — ETHANOL: Alcohol, Ethyl (B): <10 mg/dL (ref <10)

## 2022-11-05 LAB — RESP PANEL BY RT-PCR (RSV, FLU A&B, COVID)  RVPGX2
Influenza A by PCR: NEGATIVE
Influenza B by PCR: NEGATIVE
Resp Syncytial Virus by PCR: NEGATIVE
SARS Coronavirus 2 by RT PCR: NEGATIVE

## 2022-11-05 LAB — POC SARS CORONAVIRUS 2 AG: SARSCOV2ONAVIRUS 2 AG: NEGATIVE

## 2022-11-05 LAB — VALPROIC ACID LEVEL: Valproic Acid Lvl: <10 ug/mL — ABNORMAL LOW (ref 50.0–100.0)

## 2022-11-05 MED ORDER — ATORVASTATIN CALCIUM 10 MG PO TABS
10.0000 mg | ORAL_TABLET | Freq: Every day | ORAL | Status: DC
  Administered 2022-11-05 – 2022-11-06 (×2): 10 mg via ORAL
  Filled 2022-11-05 (×2): qty 1

## 2022-11-05 MED ORDER — LORAZEPAM 1 MG PO TABS
2.0000 mg | ORAL_TABLET | Freq: Once | ORAL | Status: AC
  Administered 2022-11-05: 2 mg via ORAL
  Filled 2022-11-05: qty 2

## 2022-11-05 MED ORDER — AMLODIPINE BESYLATE 10 MG PO TABS
10.0000 mg | ORAL_TABLET | Freq: Every day | ORAL | Status: DC
  Administered 2022-11-05 – 2022-11-06 (×2): 10 mg via ORAL
  Filled 2022-11-05 (×2): qty 1

## 2022-11-05 MED ORDER — LAMOTRIGINE 100 MG PO TABS
100.0000 mg | ORAL_TABLET | Freq: Two times a day (BID) | ORAL | Status: DC
  Administered 2022-11-05 – 2022-11-06 (×4): 100 mg via ORAL
  Filled 2022-11-05 (×4): qty 1

## 2022-11-05 MED ORDER — ACETAMINOPHEN 325 MG PO TABS: 650.0000 mg | ORAL_TABLET | Freq: Four times a day (QID) | ORAL | Status: DC | PRN

## 2022-11-05 MED ORDER — HYDROXYZINE HCL 25 MG PO TABS
25.0000 mg | ORAL_TABLET | Freq: Three times a day (TID) | ORAL | Status: DC | PRN
  Administered 2022-11-05 – 2022-11-06 (×2): 25 mg via ORAL
  Filled 2022-11-05 (×2): qty 1

## 2022-11-05 MED ORDER — TRAZODONE HCL 50 MG PO TABS
50.0000 mg | ORAL_TABLET | Freq: Every evening | ORAL | Status: DC | PRN
  Administered 2022-11-05 – 2022-11-06 (×2): 50 mg via ORAL
  Filled 2022-11-05 (×2): qty 1

## 2022-11-05 MED ORDER — MAGNESIUM HYDROXIDE 400 MG/5ML PO SUSP: 30.0000 mL | Freq: Every day | ORAL | Status: DC | PRN

## 2022-11-05 MED ORDER — METFORMIN HCL 500 MG PO TABS
1000.0000 mg | ORAL_TABLET | Freq: Two times a day (BID) | ORAL | Status: DC
  Administered 2022-11-05 – 2022-11-06 (×3): 1000 mg via ORAL
  Filled 2022-11-05 (×3): qty 2

## 2022-11-05 MED ORDER — LURASIDONE HCL 20 MG PO TABS
20.0000 mg | ORAL_TABLET | Freq: Every evening | ORAL | Status: DC
  Administered 2022-11-05 – 2022-11-06 (×2): 20 mg via ORAL
  Filled 2022-11-05 (×2): qty 1

## 2022-11-05 MED ORDER — DIVALPROEX SODIUM ER 500 MG PO TB24
2000.0000 mg | ORAL_TABLET | Freq: Every day | ORAL | Status: DC
  Administered 2022-11-05 – 2022-11-06 (×2): 2000 mg via ORAL
  Filled 2022-11-05 (×2): qty 4

## 2022-11-05 MED ORDER — ALUM & MAG HYDROXIDE-SIMETH 200-200-20 MG/5ML PO SUSP: 30.0000 mL | ORAL | Status: DC | PRN

## 2022-11-05 MED ORDER — LOSARTAN POTASSIUM 50 MG PO TABS
100.0000 mg | ORAL_TABLET | Freq: Every day | ORAL | Status: DC
  Administered 2022-11-05 – 2022-11-06 (×2): 100 mg via ORAL
  Filled 2022-11-05 (×2): qty 2

## 2022-11-05 NOTE — ED Provider Notes (Cosign Needed Addendum)
[facility] Continuous Assessment Admission H&P  Date: 11/05/22 Devin Ellison Name: Devin Ellison MRN: [id number] Chief Complaint: Manic behavior  Diagnoses:  Final diagnoses:  Bipolar I disorder, most recent episode (or current) manic ([place])  Substance induced mood disorder ([place])    HPI: Devin Ellison is a 67 year old Caucasian male that presents to [facility], accompanied by his ex wife. Stated that he was recently discharge from jail and is unsure if Devin Ellison received his medication while incarcerated. Devin Ellison presents pressured, labile, tangential and circumstantial. Devin Ellison reported he was recently discharged from jail due to assault.  Was reported that Devin Ellison has been incarcerated for the past 17 days.  He denies suicidal or homicidal ideations.  Denies auditory visual hallucinations.  Devin Ellison has ongoing ruminations related to the treatment he received while incarcerated.  Devin Ellison has a charted history with major depressive disorder, bipolar 1 disorder and generalized anxiety disorder.  Charted history with hypertension.  He reports he was followed by psychiatrist Devin Ellison.  reports he is currently prescribed Depakote 2000 mg daily, Lamictal 100 mg, Latuda 20 mg daily and trazodone 50 mg nightly.  Reported Devin Ellison has been using cocaine 2 days ago.  UDS+ cocaine and marijuana.  He denied any other illicit drug use currently.   During evaluation Devin Ellison is sitting in day room.  Has overt abrupt movements very animated throughout this assessment.  Presents labile tangential however redirectable. he is alert/oriented x 3; irritable but cooperative and mood incongruent with affect.  Devin Ellison is speaking in a clear, loud tone at increased volume some fluctuation, and pressured pace; with good eye contact.  His thought process is coherent and irrelevant; There is no indication that he is is currently responding to internal/external stimuli or experiencing delusional  thought content.  Devin Ellison denies suicidal/self-harm/homicidal ideation.  Devin Ellison has remained labile and very animated throughout assessment .  Will recommend inpatient admission for mood stabilization  Total Time spent with Devin Ellison: 15 minutes  Musculoskeletal  Strength & Muscle Tone: within normal limits Gait & Station: normal Devin Ellison leans: N/A  Psychiatric Specialty Exam  Presentation General Appearance: Disheveled  Eye Contact:Good  Speech:Pressured; Clear and Coherent  Speech Volume:Increased  Handedness:Right   Mood and Affect  Mood:Anxious; Labile  Affect:Labile   Thought Process  Thought Processes:Coherent  Descriptions of Associations:Tangential  Orientation:Full (Time, Place and Person)  Thought Content:Tangential  Diagnosis of Schizophrenia or Schizoaffective disorder in past: No data recorded Duration of Psychotic Symptoms: No data recorded Hallucinations:Hallucinations: None  Ideas of Reference:None  Suicidal Thoughts:Suicidal Thoughts: No  Homicidal Thoughts:Homicidal Thoughts: No   Sensorium  Memory:Immediate Good; Recent Fair; Remote Fair  Judgment:Fair  Insight:Fair   Executive Functions  Concentration:Fair  Attention Span:Fair  Devin Ellison   Psychomotor Activity  Psychomotor Activity:Psychomotor Activity: Normal   Assets  Assets:Desire for Improvement   Sleep  Sleep:Sleep: Poor   Nutritional Assessment (For [facility] and [facility] admissions only) Has the Devin Ellison had a weight loss or gain of 10 pounds or more in the last 3 months?: No Has the Devin Ellison had a decrease in food intake/or appetite?: No Does the Devin Ellison have dental problems?: No Does the Devin Ellison have eating habits or behaviors that may be indicators of an eating disorder including binging or inducing vomiting?: No Has the Devin Ellison recently lost weight without trying?: 0 Has the Devin Ellison been eating poorly because of a decreased  appetite?: 0 Malnutrition Screening Tool Score: 0    Physical Exam Vitals and  nursing note reviewed.  Cardiovascular:     Rate and Rhythm: Normal rate.     Pulses: Normal pulses.    Review of Systems  Psychiatric/Behavioral:  The Devin Ellison is nervous/anxious.   All other systems reviewed and are negative.   There were no vitals taken for this visit. There is no height or weight on file to calculate BMI.  Past Psychiatric History:   Is the Devin Ellison at risk to self? Yes  Has the Devin Ellison been a risk to self in the past 6 months? Yes .    Has the Devin Ellison been a risk to self within the distant past? No   Is the Devin Ellison a risk to others? No   Has the Devin Ellison been a risk to others in the past 6 months? No   Has the Devin Ellison been a risk to others within the distant past? No   Past Medical History:   Family History:   Social History:   Last Labs:  Admission on 11/05/2022  Component Date Value Ref Range Status   POC Amphetamine UR 11/05/2022 None Detected  NONE DETECTED (Cut Off Level 1000 ng/mL) Final   POC Secobarbital (BAR) 11/05/2022 None Detected  NONE DETECTED (Cut Off Level 300 ng/mL) Final   POC Buprenorphine (BUP) 11/05/2022 None Detected  NONE DETECTED (Cut Off Level 10 ng/mL) Final   POC Oxazepam (BZO) 11/05/2022 None Detected  NONE DETECTED (Cut Off Level 300 ng/mL) Final   POC Cocaine UR 11/05/2022 Positive (A)  NONE DETECTED (Cut Off Level 300 ng/mL) Final   POC Methamphetamine UR 11/05/2022 None Detected  NONE DETECTED (Cut Off Level 1000 ng/mL) Final   POC Morphine 11/05/2022 None Detected  NONE DETECTED (Cut Off Level 300 ng/mL) Final   POC Methadone UR 11/05/2022 None Detected  NONE DETECTED (Cut Off Level 300 ng/mL) Final   POC Oxycodone UR 11/05/2022 None Detected  NONE DETECTED (Cut Off Level 100 ng/mL) Final   POC Marijuana UR 11/05/2022 Positive (A)  NONE DETECTED (Cut Off Level 50 ng/mL) Final    Allergies: Devin Ellison has no known allergies.  Medications:   Facility Ordered Medications  Medication   acetaminophen (TYLENOL) tablet 650 mg   alum & mag hydroxide-simeth (MAALOX/MYLANTA) 200-200-20 MG/5ML suspension 30 mL   magnesium hydroxide (MILK OF MAGNESIA) suspension 30 mL   hydrOXYzine (ATARAX) tablet 25 mg   traZODone (DESYREL) tablet 50 mg   amLODipine (NORVASC) tablet 10 mg   atorvastatin (LIPITOR) tablet 10 mg   divalproex (DEPAKOTE ER) 24 hr tablet 2,000 mg   lamoTRIgine (LAMICTAL) tablet 100 mg   lurasidone (LATUDA) tablet 20 mg   metFORMIN (GLUCOPHAGE) tablet 1,000 mg   losartan (COZAAR) tablet 100 mg   [COMPLETED] LORazepam (ATIVAN) tablet 2 mg   PTA Medications  Medication Sig   losartan (COZAAR) 100 MG tablet TAKE 1 TABLET BY MOUTH EVERY DAY   amLODipine (NORVASC) 10 MG tablet Take 10 mg by mouth daily.   atorvastatin (LIPITOR) 10 MG tablet Take 10 mg by mouth daily.   metFORMIN (GLUCOPHAGE) 1000 MG tablet Take 1,000 mg by mouth 2 (two) times daily with a meal.   divalproex (DEPAKOTE ER) 500 MG 24 hr tablet Take 2,000 mg by mouth daily.   lamoTRIgine (LAMICTAL) 100 MG tablet Take 100 mg by mouth 2 (two) times daily.   lurasidone (LATUDA) 20 MG TABS tablet Take 20 mg by mouth every evening.    Medical Decision Making  Inpatient admission  -Continue Depakote 2000 mg daily -Continue Lamictal 100  mg twice daily -Continue Latuda 20 mg daily -Continue trazodone 50 mg p.o. nightly  -Pending valproic acid level, COVID, CBC CMP and TSH See chart for agitation orders    Recommendations  Based on my evaluation the Devin Ellison does not appear to have an emergency medical condition.  Derrill Center, NP 11/05/22  12:24 PM
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
## 2022-11-05 NOTE — Progress Notes (Signed)
Received Devin Ellison after his admission work - up in the OBS area. He  was oriented to his new environment and offered nourishments. He  endorsed feeling anxious at the present time. He denied feeling suicidal. He is very talkative and jumps from one topic to another.

## 2022-11-05 NOTE — Progress Notes (Signed)
Patient has been denied by [facility] due to no appropriate beds available. Patient meets [place] inpatient criteria per Ricky Ala, NP. Patient has been faxed out to the following facilities:   [facility]  [address], [place] [address] [phone] [place]  [address], [place] [address] [phone] [place]  [place], [place] [address] [facility]  [place] [place] [address] [phone]  [place] [address] [place] [address] [id number]  [facility]  [place] [address] [phone] [place]  [address] [place] [address] [phone]  [facility]  [place], [place] [address] [phone]  [facility]  [address] [place], [place] [address] [phone]  [facility]  [place] [place] [address] [place]  [facility]  [address], [place] [address] [phone]  [facility]  [address] [place] [address] [phone] [place], MSW, LCSW-A  12:08 PM 11/05/2022

## 2022-11-05 NOTE — ED Notes (Signed)
Pt sleeping@this time. Breathing even and unlabored. Will continue to monitor for safety 
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
## 2022-11-05 NOTE — ED Notes (Signed)
Pt was offered food but he refused

## 2022-11-05 NOTE — ED Notes (Signed)
Pt sleeping at present, no distress noted. Respirations even & unlabored.  Skin color good..  Denies SI, HI or AVH.  Monitoring for safety.

## 2022-11-05 NOTE — ED Notes (Signed)
DASH courier called to collect STAT specimens and to deliver to [facility].

## 2022-11-06 DIAGNOSIS — F3110 Bipolar disorder, current episode manic without psychotic features, unspecified: Secondary | ICD-10-CM | POA: Diagnosis not present

## 2022-11-06 DIAGNOSIS — E119 Type 2 diabetes mellitus without complications: Secondary | ICD-10-CM | POA: Diagnosis not present

## 2022-11-06 DIAGNOSIS — F319 Bipolar disorder, unspecified: Secondary | ICD-10-CM | POA: Diagnosis not present

## 2022-11-06 LAB — GLUCOSE, CAPILLARY
Glucose-Capillary: 138 mg/dL — ABNORMAL HIGH (ref 70–99)
Glucose-Capillary: 157 mg/dL — ABNORMAL HIGH (ref 70–99)

## 2022-11-06 MED ORDER — INSULIN ASPART 100 UNIT/ML IJ SOLN: 0.0000 [IU] | Freq: Three times a day (TID) | INTRAMUSCULAR | Status: DC

## 2022-11-06 MED ORDER — EMPAGLIFLOZIN 10 MG PO TABS
10.0000 mg | ORAL_TABLET | Freq: Every day | ORAL | Status: DC
  Administered 2022-11-06: 10 mg via ORAL
  Filled 2022-11-06: qty 1
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
## 2022-11-06 NOTE — Progress Notes (Signed)
Patient has been denied by [facility] due to no appropriate beds available. Patient meets [place] inpatient criteria per Ricky Ala, NP. Patient has been faxed out to the following facilities:   [facility]  [address] [place] [address] [phone] [place]  [address] [place] [address] [phone] [place]  [place], [place] [address] [facility]  [place]., [place] [address] [phone]  [place] [address], [place] [address] [id number]  [facility]  [place] [address] [phone] [place]  [address], [place] [address] [phone]  [facility]  [place], [place] [address] [phone]  [facility]  [address] [place], [place] [address] [phone]  [facility]  [place] [place] [address] [place]  [facility]  [address] [place] [address] [phone]  [facility]  [address] [place] [address] [phone] [place], MSW, LCSW-A  2:55 PM 11/06/2022

## 2022-11-06 NOTE — ED Notes (Signed)
Pt's ex-wife brought in Development worker, community for phone and cord.  Item placed in pts locker 21.

## 2022-11-06 NOTE — Progress Notes (Signed)
Devin Ellison remained  visible in the milieu throughout the day. He napped at intervals, ate his meals and remained angry. He has been compliant with his medication and CBG monitoring.
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
## 2022-11-06 NOTE — ED Notes (Signed)
Pt sleeping at present, no distress noted.  Monitoring for safety. 

## 2022-11-07 ENCOUNTER — Encounter (HOSPITAL_COMMUNITY): Payer: Self-pay | Admitting: Behavioral Health

## 2022-11-07 DIAGNOSIS — F1994 Other psychoactive substance use, unspecified with psychoactive substance-induced mood disorder: Secondary | ICD-10-CM | POA: Diagnosis not present

## 2022-11-07 DIAGNOSIS — E119 Type 2 diabetes mellitus without complications: Secondary | ICD-10-CM | POA: Diagnosis not present

## 2022-11-07 DIAGNOSIS — F319 Bipolar disorder, unspecified: Secondary | ICD-10-CM | POA: Diagnosis not present

## 2022-11-07 DIAGNOSIS — F3110 Bipolar disorder, current episode manic without psychotic features, unspecified: Secondary | ICD-10-CM | POA: Diagnosis not present

## 2022-11-07 NOTE — ED Notes (Signed)
Patient was heard on the phone telling someone " you will never hear from me again'. The patients sister called in soon after stating she was on the phone with the patient and that she is now worried for the patients children and ex-wifes safety

## 2022-11-07 NOTE — ED Notes (Signed)
Patient refuses to allow [place] to take his blood sugar. Patient has been asked on several occasions if we could check his blood sugar prior to eating. He is continuing to refuse to allow [place] to check, he is also refusing all medications.

## 2022-11-07 NOTE — ED Notes (Signed)
Patient is currently walking the unit upset and fussing on the phone with family due to being IVC.
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
## 2022-11-07 NOTE — ED Notes (Signed)
This nurse spoke with patient and advised, I will need to take his blood sugar in order to give him his metformin and insulin. The patient responded with he is refusing medication and treatment and want to know what can be done to start the process of him being discharged

## 2022-11-07 NOTE — ED Notes (Signed)
Patient is sitting quietly in bed no distress noted, will continue to monitor patient for safety

## 2022-11-07 NOTE — ED Notes (Signed)
Patient is upset and yelling due to "having been here the last 24 hours and he feels like items have been stolen from his apartment".

## 2022-11-07 NOTE — Discharge Instructions (Addendum)
Keep scheduled appointments with Dr. [name] (PCP) and Dr. [name] (psychiatry)  Substance Abuse Treatment Programs  Intensive Outpatient Programs [place]. [place], [place]       [facility] #B [place], [place]  [facility]     (Inpatient and outpatient)     [address]           [place] [phone] (Suboxone and Methadone)  [place], [place] [address]      [place] [address] [phone] [place], [place]  [facility] (Outpatient/Inpatient, Chemical)    (insurance only) [phone]             [facility] ([place]) [place], [place]     [facility]     [address]     [place], [place]       [facility] (for caregivers and family) [phone] [address] Kristeen Mans. [place], [place]      Residential Treatment Programs [facility]      [address], [place], [place] [address]  [phone]       [facility] [address], [place], [place] [address] [phone]  [facility] [place]        [phone]       [facility] [phone]  [facility] ([place].)             [place], [place] or [place] of [place], [address] [phone]  [facility]    [address]      [place], [place]       [facility] [address] [place], [place]  [place]   [address] [place], [place] [address]     [phone]      Admissions: 8am-3pm  M-F  [facility] (RTS) [address] [place], [place]  [facility]ATS Program: Residential Program [phone] Days)   [place], [place] or [phone]     ADATC: [facility] [place], [place] (Walk in Hours over the weekend or by referral)  [facility] [place], [place],  [address] [phone]  Crisis Mobile: [facility]:  [phone] (for crisis response 24 hours a day) [place]:  [phone] Outpatient Psychiatry and Counseling  [facility]: Mobile Crisis Management 24 hours:  [phone]  [facility] [employer] sliding scale fee and walk in schedule: M-F 8am-12pm/1pm-3pm [place], [place] [address] [place], [place] [address] [phone]  [facility] (Formerly known as [facility])- new patient walk-in appointments available Monday - Friday 8am -3pm.          [address] [place], [place] [address] [phone] or crisis line- [place] [facility]/ Intensive Outpatient Therapy Program [place], [place] [address] [place]      [phone] [address] [place], [place] [address]                 [place]   [facility] [phone]. [place], [place] [address]   [employer] [facility] Care          [address] Johnette Abraham  [place], [place] [address]       [phone]  [place], [place], [place] [address] [phone]  [facility]    [address] [place], [place] [address]     [place], [place] Joycelyn Man     [place]  [address]     [phone]       [facility] [place] [address]  [facility]     [address], [place], MD Eleanor [place], [place] [address] [place]     [address]     [place], [place] [address]     [phone]       [facility] [address] [place], [place] [address] [phone] Resources for Temporary Residential Assistance/Crisis [facility]) M-F 8am-3pm   [address] [place], [place] [address]   [phone] Services include: laundry, barbering, support groups, case management, phone  & computer access, showers, AA/NA mtgs, mental health/substance abuse nurse, job skills class, disability information, VA assistance, spiritual classes, etc.   [facility]ESS [facility]   [address], [place]     B[place]y [place] (women and children)       [place]. [place],  [address] [phone] [email or web address] for application and process Application Required  [facility]   [address]    [place] [address]     [phone]                    [place],  [address] [id number] application appt.) Application Required  [employer] (women only)    [place], [place]  [place]      Intake starts 6pm daily Need valid ID, SSC, & Police report [facility] [address] [place], [place] [id number] Application Required  [employer] (men only)     [place].      [place], [place]       [place] (Pregnant  women only) [address] [place], [place]  [facility]      Nettie Dani [place].      [place], [place] [address]     [phone]             [facility] [address] [place], [place] 90 day commitment/SA/Application process  [facility](men only)     [address]     [place], [place]       Check-in at [facility] [address] [place], [place] [address] [phone] Men/Women/Women and Children must be there by 7 pm  [place], [place]                     Outpatient Services for Outpatient Therapy   Based on what you have shared, a list of resources for outpatient therapy and psychiatry is provided below to get you started back on treatment.  It is imperative that you follow through with treatment within 5-7 days from the day of discharge to prevent any further risk to your safety or mental well-being.  You are not limited to the list provided.  In case of an urgent crisis, you may contact the Mobile Crisis Unit with [facility] at [phone].         [facility] [place], [place], [address] [phone] phone  New Patient Assessment/Therapy Walk-ins Monday and Wednesday: 8am until slots are full. Every 1st and 2nd Friday: 1pm - 5pm  NO ASSESSMENT/THERAPY WALK-INS ON [place]  New Patient Psychiatry/Medication Management Walk-ins Monday-Friday: 8am-11am  For all walk-ins, we ask that you arrive by 7:30am because patient will be seen in the order of arrival.  Availability is limited; therefore, you may not be seen on the same day that you walk-in.  Our goal is to serve and meet the needs of our community to the best of our ability.   [facility] [address], [place], [place], [address] [phone] phone  [facility], [place], [place], [address] [phone] phone [phone] phone ([address] AmeriHealth, Anthem/Elevance, BCBS, Petroleum, [place], ComPsych, Healthy [place], [place], [place], [place], [place], Southern Company, [place], Out of Network)  [employer], [place]., [place], [place], [address] [phone] phone (Monarch, Anthem/Elevance, [employer] Options/[place], [place], [place], [place], [place], [place], [employer] Met[employer] Company, [place], [place], [place], [facility])  [place] [address] [place], [place], [address] [phone] phone (Medicaid, ask about other insurance)  [facility] [address] [place], [place], [address] [phone] phone [phone] fax ([address], [place] , [place], [place], [place], UHC, Pilgrim's Pride, Self-Pay)  Evangeline Dakin [place]. [place], [place], [address] [phone] phone ([address], Anthem/Elevance, [place], [place], [place], [place],  Medicaid, Medicare, Optum, Tricare, UMR, [facility])  Delta - 6-8 MONTH WAIT FOR THERAPY; SOON[facility] FOR MEDICATION MANAGEMENT [address], [place], [place], [address] [phone] phone ([address], [place], [place], [place], [place], Friday Health Plans, Gateway Health, BCBS Healthy Blue, Humana, [place], [place], [place], [place], [place], [place], UHC, The TJX Companies, Firestone)  [facility] [address], [place], [place], [address] [phone] phone  [place] [address], [place], [place], [address] [phone] phone  [facility] [address] [place], [place], [address] [phone] phone  [facility] [address] [place], [address] [phone] phone  [facility], [place] [address], [place], [address] [phone] phone  [facility] to [place]., [place], [place], [address] [phone] phone [phone] fax  [facility] [address] [place], [place], [address] [phone] phone [phone] fax  [facility] [phone] N. [place], [place], [address] [phone] phone  Jinny Blossom [address] [place], [place], [address]  [phone] phone  [facility]  (Adults Only) [address] [place], [place], [address]  [phone] phone [phone] fax

## 2022-11-07 NOTE — ED Provider Notes (Signed)
Rescind IVC After thorough evaluation and review of information currently presented on assessment of LECIL MOFFA (respondent), there is insufficient findings to indicate respondent meets criteria for involuntary commitment or require an inpatient level of care.  Respondent is alert/oriented x 4, calm, cooperative, and mood congruent with affect.  Respondent is speaking in a clear tone at moderate volume, and normal pace, with good eye contact.  Respondents' thought process is coherent, relevant, and there is no indication that the respondent is currently responding to internal/external stimuli or experiencing delusional thought content.  Respondent denies suicidal/self-harm/homicidal ideation, psychosis, and paranoia.  Respondent has remained calm and cooperative throughout assessment and responded to questions appropriately.  Currently respondent is not significantly impaired, psychotic, or manic on exam.  A detailed risk assessment has been completed based on clinical exam and individual risk factors.  There is no evidence of imminent risk to self or others at present and respondent does not meet criteria for psychiatric inpatient admission.  Shuntavia Yerby B. Makeila Yamaguchi, NP

## 2022-12-23 DIAGNOSIS — E119 Type 2 diabetes mellitus without complications: Secondary | ICD-10-CM | POA: Diagnosis not present

## 2022-12-23 DIAGNOSIS — H3552 Pigmentary retinal dystrophy: Secondary | ICD-10-CM | POA: Diagnosis not present

## 2022-12-23 DIAGNOSIS — H2513 Age-related nuclear cataract, bilateral: Secondary | ICD-10-CM | POA: Diagnosis not present

## 2023-03-14 ENCOUNTER — Encounter (HOSPITAL_BASED_OUTPATIENT_CLINIC_OR_DEPARTMENT_OTHER): Payer: Self-pay

## 2023-03-14 ENCOUNTER — Emergency Department (HOSPITAL_BASED_OUTPATIENT_CLINIC_OR_DEPARTMENT_OTHER): Payer: PPO

## 2023-03-14 ENCOUNTER — Other Ambulatory Visit: Payer: Self-pay

## 2023-03-14 ENCOUNTER — Emergency Department (HOSPITAL_BASED_OUTPATIENT_CLINIC_OR_DEPARTMENT_OTHER)
Admission: EM | Admit: 2023-03-14 | Discharge: 2023-03-14 | Discharge status: Against Medical Advice | Payer: PPO | Source: Home / Self Care | Attending: Emergency Medicine | Admitting: Emergency Medicine

## 2023-03-14 ENCOUNTER — Encounter (HOSPITAL_COMMUNITY): Payer: Self-pay

## 2023-03-14 ENCOUNTER — Observation Stay (HOSPITAL_COMMUNITY)
Admission: EM | Admit: 2023-03-14 | Discharge: 2023-03-16 | Disposition: A | Discharge status: Home / Self Care | Payer: PPO | Attending: Internal Medicine | Admitting: Internal Medicine

## 2023-03-14 DIAGNOSIS — I6509 Occlusion and stenosis of unspecified vertebral artery: Secondary | ICD-10-CM | POA: Insufficient documentation

## 2023-03-14 DIAGNOSIS — F17210 Nicotine dependence, cigarettes, uncomplicated: Secondary | ICD-10-CM | POA: Insufficient documentation

## 2023-03-14 DIAGNOSIS — I129 Hypertensive chronic kidney disease with stage 1 through stage 4 chronic kidney disease, or unspecified chronic kidney disease: Secondary | ICD-10-CM | POA: Diagnosis not present

## 2023-03-14 DIAGNOSIS — I6523 Occlusion and stenosis of bilateral carotid arteries: Secondary | ICD-10-CM | POA: Diagnosis not present

## 2023-03-14 DIAGNOSIS — Z794 Long term (current) use of insulin: Secondary | ICD-10-CM | POA: Diagnosis not present

## 2023-03-14 DIAGNOSIS — I6502 Occlusion and stenosis of left vertebral artery: Principal | ICD-10-CM | POA: Insufficient documentation

## 2023-03-14 DIAGNOSIS — Z7984 Long term (current) use of oral hypoglycemic drugs: Secondary | ICD-10-CM | POA: Insufficient documentation

## 2023-03-14 DIAGNOSIS — N189 Chronic kidney disease, unspecified: Secondary | ICD-10-CM | POA: Diagnosis not present

## 2023-03-14 DIAGNOSIS — Z5329 Procedure and treatment not carried out because of patient's decision for other reasons: Secondary | ICD-10-CM | POA: Insufficient documentation

## 2023-03-14 DIAGNOSIS — Z79899 Other long term (current) drug therapy: Secondary | ICD-10-CM | POA: Insufficient documentation

## 2023-03-14 DIAGNOSIS — Z599 Problem related to housing and economic circumstances, unspecified: Secondary | ICD-10-CM | POA: Insufficient documentation

## 2023-03-14 DIAGNOSIS — I669 Occlusion and stenosis of unspecified cerebral artery: Secondary | ICD-10-CM | POA: Insufficient documentation

## 2023-03-14 DIAGNOSIS — I77819 Aortic ectasia, unspecified site: Secondary | ICD-10-CM | POA: Diagnosis not present

## 2023-03-14 DIAGNOSIS — E1122 Type 2 diabetes mellitus with diabetic chronic kidney disease: Secondary | ICD-10-CM | POA: Diagnosis not present

## 2023-03-14 DIAGNOSIS — Z720 Tobacco use: Secondary | ICD-10-CM | POA: Insufficient documentation

## 2023-03-14 DIAGNOSIS — I10 Essential (primary) hypertension: Secondary | ICD-10-CM

## 2023-03-14 DIAGNOSIS — R29898 Other symptoms and signs involving the musculoskeletal system: Secondary | ICD-10-CM | POA: Diagnosis not present

## 2023-03-14 DIAGNOSIS — R55 Syncope and collapse: Secondary | ICD-10-CM | POA: Diagnosis not present

## 2023-03-14 DIAGNOSIS — E119 Type 2 diabetes mellitus without complications: Secondary | ICD-10-CM

## 2023-03-14 DIAGNOSIS — R2681 Unsteadiness on feet: Secondary | ICD-10-CM | POA: Diagnosis not present

## 2023-03-14 DIAGNOSIS — I6503 Occlusion and stenosis of bilateral vertebral arteries: Secondary | ICD-10-CM | POA: Diagnosis not present

## 2023-03-14 DIAGNOSIS — H532 Diplopia: Secondary | ICD-10-CM | POA: Diagnosis present

## 2023-03-14 DIAGNOSIS — D72829 Elevated white blood cell count, unspecified: Secondary | ICD-10-CM | POA: Insufficient documentation

## 2023-03-14 DIAGNOSIS — G459 Transient cerebral ischemic attack, unspecified: Secondary | ICD-10-CM | POA: Insufficient documentation

## 2023-03-14 DIAGNOSIS — I672 Cerebral atherosclerosis: Secondary | ICD-10-CM | POA: Diagnosis not present

## 2023-03-14 DIAGNOSIS — E1165 Type 2 diabetes mellitus with hyperglycemia: Secondary | ICD-10-CM | POA: Insufficient documentation

## 2023-03-14 DIAGNOSIS — Z7982 Long term (current) use of aspirin: Secondary | ICD-10-CM | POA: Insufficient documentation

## 2023-03-14 DIAGNOSIS — R7989 Other specified abnormal findings of blood chemistry: Secondary | ICD-10-CM | POA: Insufficient documentation

## 2023-03-14 DIAGNOSIS — I708 Atherosclerosis of other arteries: Secondary | ICD-10-CM | POA: Insufficient documentation

## 2023-03-14 DIAGNOSIS — Z8249 Family history of ischemic heart disease and other diseases of the circulatory system: Secondary | ICD-10-CM | POA: Insufficient documentation

## 2023-03-14 DIAGNOSIS — R778 Other specified abnormalities of plasma proteins: Secondary | ICD-10-CM | POA: Diagnosis not present

## 2023-03-14 DIAGNOSIS — Z560 Unemployment, unspecified: Secondary | ICD-10-CM | POA: Insufficient documentation

## 2023-03-14 DIAGNOSIS — I6621 Occlusion and stenosis of right posterior cerebral artery: Secondary | ICD-10-CM | POA: Diagnosis not present

## 2023-03-14 DIAGNOSIS — R42 Dizziness and giddiness: Secondary | ICD-10-CM | POA: Diagnosis not present

## 2023-03-14 DIAGNOSIS — F3177 Bipolar disorder, in partial remission, most recent episode mixed: Secondary | ICD-10-CM | POA: Diagnosis present

## 2023-03-14 LAB — BASIC METABOLIC PANEL
Anion gap: 12 (ref 5–15)
BUN: 9 mg/dL (ref 8–23)
CO2: 20 mmol/L — ABNORMAL LOW (ref 22–32)
Calcium: 9.3 mg/dL (ref 8.9–10.3)
Chloride: 103 mmol/L (ref 98–111)
Creatinine, Ser: 0.98 mg/dL (ref 0.61–1.24)
GFR, Estimated: >60 mL/min (ref >60)
Glucose, Bld: 324 mg/dL — ABNORMAL HIGH (ref 70–99)
Potassium: 3.7 mmol/L (ref 3.5–5.1)
Sodium: 135 mmol/L (ref 135–145)

## 2023-03-14 LAB — LIPID PANEL
Cholesterol: 193 mg/dL (ref 0–200)
HDL: 40 mg/dL — ABNORMAL LOW (ref >40)
LDL Cholesterol: 97 mg/dL (ref 0–99)
Total CHOL/HDL Ratio: 4.8 RATIO
Triglycerides: 279 mg/dL — ABNORMAL HIGH (ref <150)
VLDL: 56 mg/dL — ABNORMAL HIGH (ref 0–40)

## 2023-03-14 LAB — CBC
HCT: 45.1 % (ref 39.0–52.0)
Hemoglobin: 15.5 g/dL (ref 13.0–17.0)
MCH: 30.8 pg (ref 26.0–34.0)
MCHC: 34.4 g/dL (ref 30.0–36.0)
MCV: 89.7 fL (ref 80.0–100.0)
Platelets: 256 10*3/uL (ref 150–400)
RBC: 5.03 MIL/uL (ref 4.22–5.81)
RDW: 12.7 % (ref 11.5–15.5)
WBC: 10.6 10*3/uL — ABNORMAL HIGH (ref 4.0–10.5)
nRBC: 0 % (ref 0.0–0.2)

## 2023-03-14 LAB — HEMOGLOBIN A1C
Hgb A1c MFr Bld: 9 % — ABNORMAL HIGH (ref 4.8–5.6)
Mean Plasma Glucose: 211.6 mg/dL

## 2023-03-14 LAB — TROPONIN I (HIGH SENSITIVITY)
Troponin I (High Sensitivity): 40 ng/L — ABNORMAL HIGH (ref <18)
Troponin I (High Sensitivity): 39 ng/L — ABNORMAL HIGH (ref <18)
Troponin I (High Sensitivity): 59 ng/L — ABNORMAL HIGH (ref <18)

## 2023-03-14 MED ORDER — ASPIRIN 81 MG PO CHEW
324.0000 mg | CHEWABLE_TABLET | Freq: Once | ORAL | Status: AC
  Administered 2023-03-14: 324 mg via ORAL
  Filled 2023-03-14: qty 4

## 2023-03-14 MED ORDER — INSULIN ASPART 100 UNIT/ML IJ SOLN: 0.0000 [IU] | Freq: Three times a day (TID) | INTRAMUSCULAR | Status: DC

## 2023-03-14 MED ORDER — ATORVASTATIN CALCIUM 10 MG PO TABS: 10.0000 mg | ORAL_TABLET | Freq: Every day | ORAL | Status: DC

## 2023-03-14 MED ORDER — ACETAMINOPHEN 650 MG RE SUPP: 650.0000 mg | RECTAL | Status: DC | PRN

## 2023-03-14 MED ORDER — ENOXAPARIN SODIUM 40 MG/0.4ML IJ SOSY
40.0000 mg | PREFILLED_SYRINGE | INTRAMUSCULAR | Status: DC
  Administered 2023-03-14: 40 mg via SUBCUTANEOUS
  Filled 2023-03-14: qty 0.4

## 2023-03-14 MED ORDER — ONDANSETRON HCL 4 MG/2ML IJ SOLN: 4.0000 mg | Freq: Four times a day (QID) | INTRAMUSCULAR | Status: DC | PRN

## 2023-03-14 MED ORDER — ATORVASTATIN CALCIUM 40 MG PO TABS: 40.0000 mg | ORAL_TABLET | Freq: Every day | ORAL | Status: DC

## 2023-03-14 MED ORDER — LOSARTAN POTASSIUM 50 MG PO TABS
50.0000 mg | ORAL_TABLET | Freq: Every day | ORAL | Status: DC
  Administered 2023-03-15 – 2023-03-16 (×2): 50 mg via ORAL
  Filled 2023-03-14 (×2): qty 1

## 2023-03-14 MED ORDER — STROKE: EARLY STAGES OF RECOVERY BOOK
Freq: Once | Status: AC
  Filled 2023-03-14: qty 1

## 2023-03-14 MED ORDER — SENNOSIDES-DOCUSATE SODIUM 8.6-50 MG PO TABS: 1.0000 | ORAL_TABLET | Freq: Every evening | ORAL | Status: DC | PRN

## 2023-03-14 MED ORDER — ACETAMINOPHEN 325 MG PO TABS: 650.0000 mg | ORAL_TABLET | ORAL | Status: DC | PRN

## 2023-03-14 MED ORDER — HYDRALAZINE HCL 20 MG/ML IJ SOLN: 10.0000 mg | Freq: Four times a day (QID) | INTRAMUSCULAR | Status: DC | PRN

## 2023-03-14 MED ORDER — ATORVASTATIN CALCIUM 40 MG PO TABS
40.0000 mg | ORAL_TABLET | Freq: Every day | ORAL | Status: DC
  Administered 2023-03-15 – 2023-03-16 (×3): 40 mg via ORAL
  Filled 2023-03-14 (×3): qty 1

## 2023-03-14 MED ORDER — ASPIRIN 81 MG PO TBEC
81.0000 mg | DELAYED_RELEASE_TABLET | Freq: Every day | ORAL | Status: DC
  Administered 2023-03-15 – 2023-03-16 (×2): 81 mg via ORAL
  Filled 2023-03-14 (×2): qty 1

## 2023-03-14 MED ORDER — ACETAMINOPHEN 160 MG/5ML PO SOLN: 650.0000 mg | ORAL | Status: DC | PRN

## 2023-03-14 MED ORDER — IOHEXOL 350 MG/ML SOLN
100.0000 mL | Freq: Once | INTRAVENOUS | Status: AC | PRN
  Administered 2023-03-14: 80 mL via INTRAVENOUS

## 2023-03-14 NOTE — ED Notes (Addendum)
PT left AMA... Pt understood what the risk of leaving AMA.... Pt stated that he had to go home and take care of his animals and would then return to the hospital... Pt instructed that since he possiblly needed a MRI and further neuro work up to return to [facility]... Pt understood.... Provider also attempted to have Pt stay but unable... Pt left Aox4 and no current symptoms... Provider aware of V/S.Marland KitchenMarland Kitchen

## 2023-03-14 NOTE — ED Provider Notes (Signed)
[place] [facility] Provider Note   CSN: [id number] Arrival date & time: 03/14/23  1958     History  Chief Complaint  Patient presents with   Transient Ischemic Attack    Devin Ellison is a 67 y.o. male history of diabetes, hypertension, tobacco use presented for intermittent episodes of diplopia and weakness over the past week.  Patient states that these episodes come and go without trigger however today's had 2 episodes.  Patient originally went to the urgent care and then [facility].  [facility] he was diagnosed with a TIA and encouraged to be admitted but left due to needing to run errands.  Patient returned state he did not have any symptoms while being away and is currently asymptomatic.  Patient denies chest pain, shortness of breath, vision changes, weakness, LOC, fever  Home Medications Prior to Admission medications   Medication Sig Start Date End Date Taking? Authorizing Provider  amLODipine (NORVASC) 10 MG tablet Take 10 mg by mouth daily. 03/06/21   [provider]  atorvastatin (LIPITOR) 10 MG tablet Take 10 mg by mouth daily. 03/24/21   [provider]  divalproex (DEPAKOTE ER) 500 MG 24 hr tablet Take 2,000 mg by mouth daily. 05/26/21   [provider]  empagliflozin (JARDIANCE) 10 MG TABS tablet Take 10 mg by mouth daily.    [provider]  lamoTRIgine (LAMICTAL) 100 MG tablet Take 100 mg by mouth 2 (two) times daily. 06/21/21   [provider]  losartan (COZAAR) 100 MG tablet TAKE 1 TABLET BY MOUTH EVERY DAY 06/12/20   Lezlie Lye, Meda Coffee, MD  lurasidone (LATUDA) 20 MG TABS tablet Take 20 mg by mouth every evening.    [provider]  metFORMIN (GLUCOPHAGE) 1000 MG tablet Take 1,000 mg by mouth 2 (two) times daily with a meal. 03/24/21   [provider]      Allergies    Patient has no known allergies.    Review of Systems   Review of Systems See  HPI Physical Exam Updated Vital Signs BP (!) 176/104   Pulse 100   Temp 98.9 F (37.2 C)   Resp 17   SpO2 96%  Physical Exam Vitals reviewed.  Constitutional:      General: He is not in acute distress. HENT:     Head: Normocephalic and atraumatic.  Eyes:     Extraocular Movements: Extraocular movements intact.     Conjunctiva/sclera: Conjunctivae normal.     Pupils: Pupils are equal, round, and reactive to light.  Cardiovascular:     Rate and Rhythm: Normal rate and regular rhythm.     Pulses: Normal pulses.     Heart sounds: Normal heart sounds.     Comments: 2+ bilateral radial/dorsalis pedis pulses with regular rate Pulmonary:     Effort: Pulmonary effort is normal. No respiratory distress.     Breath sounds: Normal breath sounds.  Abdominal:     Palpations: Abdomen is soft.     Tenderness: There is no abdominal tenderness. There is no guarding or rebound.  Musculoskeletal:        General: Normal range of motion.     Cervical back: Normal range of motion and neck supple.     Comments: 5 out of 5 bilateral grip/leg extension strength  Skin:    General: Skin is warm and dry.     Capillary Refill: Capillary refill takes less than 2 seconds.  Neurological:  General: No focal deficit present.     Mental Status: He is alert and oriented to person, place, and time.     Sensory: Sensation is intact.     Motor: Motor function is intact.     Coordination: Coordination is intact.     Comments: Sensation intact in all 4 limbs Vision grossly intact Nerves III through XII intact  Psychiatric:        Mood and Affect: Mood normal.     ED Results / Procedures / Treatments   Labs (all labs ordered are listed, but only abnormal results are displayed) Labs Reviewed - No data to display  EKG None  Radiology CT ANGIO HEAD NECK W WO CM  Result Date: 03/14/2023 CLINICAL DATA:  Transient ischemic attack (TIA) intermittent diplopia EXAM: CT ANGIOGRAPHY HEAD AND NECK WITH AND  WITHOUT CONTRAST TECHNIQUE: Multidetector CT imaging of the head and neck was performed using the standard protocol during bolus administration of intravenous contrast. Multiplanar CT image reconstructions and MIPs were obtained to evaluate the vascular anatomy. Carotid stenosis measurements (when applicable) are obtained utilizing NASCET criteria, using the distal internal carotid diameter as the denominator. RADIATION DOSE REDUCTION: This exam was performed according to the departmental dose-optimization program which includes automated exposure control, adjustment of the mA and/or kV according to patient size and/or use of iterative reconstruction technique. CONTRAST:  80mL OMNIPAQUE IOHEXOL 350 MG/ML SOLN COMPARISON:  None Available. FINDINGS: CT HEAD FINDINGS Brain: No evidence of acute infarction, hemorrhage, hydrocephalus, extra-axial collection or mass lesion/mass effect. Vascular: See below. Skull: No acute fracture. Sinuses/Orbits: Moderate right maxillary sinus mucosal thickening and mild scattered ethmoid air cell mucosal thickening. No acute orbital findings. Other: No mastoid effusions. Review of the MIP images confirms the above findings CTA NECK FINDINGS Aortic arch: Great vessel origins are patent. Right carotid system: Atherosclerosis at the carotid bifurcation involving the proximal ICA without greater than 50% stenosis relative to the distal vessel. Left carotid system: Carotid bifurcation atherosclerosis without greater than 50% stenosis. Vertebral arteries: Severe stenosis of the bilateral vertebral artery origins due to calcific atherosclerosis. Otherwise, vertebral arteries are patent without significant stenosis. Skeleton: No acute abnormality on limited assessment. Other neck: No acute abnormality on limited assessment. Upper chest: Visualized lung apices are clear. Review of the MIP images confirms the above findings CTA HEAD FINDINGS Anterior circulation: Bilateral intracranial ICAs are  patent with moderate narrowing bilaterally due to calcific atherosclerosis. Bilateral MCAs and bilateral ACAs are patent without proximal hemodynamically significant stenosis. Posterior circulation: Severe stenosis of the proximal left intradural vertebral artery. Right intradural vertebral artery, basilar artery and both posterior cerebral arteries are patent. Moderate left and mild right P2 PCA stenosis. Venous sinuses: As permitted by contrast timing, patent. Review of the MIP images confirms the above findings IMPRESSION: 1. No emergent large vessel occlusion. 2. Severe left intradural vertebral artery stenosis. 3. Severe bilateral vertebral artery origin stenosis. 4. Moderate bilateral intracranial ICA stenosis. 5. Moderate left and mild right P2 PCA stenosis. Electronically Signed   By: Feliberto Harts M.D.   On: 03/14/2023 17:55    Procedures Procedures    Medications Ordered in ED Medications - No data to display  ED Course/ Medical Decision Making/ A&P                             Medical Decision Making  ALONZA KNISLEY 67 y.o. presented today for TIA that was diagnosed earlier today.  Review of prior external notes: 03/14/2023 ED provider  Unique Tests and My Interpretation: None  Discussion with Independent Historian: None  Discussion of Management of Tests:  Sundil, MD Hospitalist  Risk: High: hospitalization or escalation of hospital-level care  Risk Stratification Score: None  Plan: On exam patient was in no acute distress and stable vitals.  Patient's neuroexam and physical exam was reassuring.  Patient did not endorse any symptoms since being [facility] earlier today.  At this time patient is stable for admission and the hospitalist will be consulted.  Hospitalist was consulted and patient was accepted for admission.  Patient stable for admission at this time.         Final Clinical Impression(s) / ED Diagnoses Final diagnoses:  None    Rx / DC  Orders ED Discharge Orders     None         Remi Deter 03/14/23 2234    Lonell Grandchild, MD 03/14/23 2256

## 2023-03-14 NOTE — ED Provider Notes (Signed)
[place] [facility] DEPARTMENT AT [facility] Provider Note   CSN: [id number] Arrival date & time: 03/14/23  1401     History  Chief Complaint  Patient presents with   Dizziness    Devin Ellison is a 67 y.o. male.  Patient with history of diabetes, hypertension, tobacco use -- presents emergency department today for evaluation of intermittent episodes of diplopia.  Symptoms have been occurring approximately daily over the past week, however happened twice today.  They occur randomly and last for about 5 minutes.  Today he was watching a tennis match on TV when he began having diplopia.  This then resolved.  Yesterday he was standing in line at [facility] and had another episode.  He states that he had to stand in a corner to help him keep his balance until the episodes resolved.  When he gets these, no associated chest pain or shortness of breath.  No lightheadedness, vertigo.  He has not had full syncope.  No recent nausea, vomiting or diarrhea.  He is eating and drinking well. Patient denies signs of stroke including: facial droop, slurred speech, aphasia, weakness/numbness in extremities, imbalance/trouble walking.  Prior to arrival he did go to [place] walk-in clinic.  He states that he was told there that his EKG was abnormal and referred to the emergency room.       Home Medications Prior to Admission medications   Medication Sig Start Date End Date Taking? Authorizing Provider  amLODipine (NORVASC) 10 MG tablet Take 10 mg by mouth daily. 03/06/21   [provider]  atorvastatin (LIPITOR) 10 MG tablet Take 10 mg by mouth daily. 03/24/21   [provider]  divalproex (DEPAKOTE ER) 500 MG 24 hr tablet Take 2,000 mg by mouth daily. 05/26/21   [provider]  empagliflozin (JARDIANCE) 10 MG TABS tablet Take 10 mg by mouth daily.    [provider]  lamoTRIgine (LAMICTAL) 100 MG tablet Take 100 mg by mouth 2 (two) times daily. 06/21/21    [provider]  losartan (COZAAR) 100 MG tablet TAKE 1 TABLET BY MOUTH EVERY DAY 06/12/20   Lezlie Lye, Meda Coffee, MD  lurasidone (LATUDA) 20 MG TABS tablet Take 20 mg by mouth every evening.    [provider]  metFORMIN (GLUCOPHAGE) 1000 MG tablet Take 1,000 mg by mouth 2 (two) times daily with a meal. 03/24/21   [provider]      Allergies    Patient has no known allergies.    Review of Systems   Review of Systems  Physical Exam Updated Vital Signs BP (!) 166/94   Pulse 72   Temp 99.6 F (37.6 C) (Oral)   Resp 15   Ht 6' (1.829 m)   Wt 100.7 kg   SpO2 99%   BMI 30.11 kg/m   Physical Exam Vitals and nursing note reviewed.  Constitutional:      Appearance: He is well-developed.  HENT:     Head: Normocephalic and atraumatic.     Right Ear: Tympanic membrane, ear canal and external ear normal.     Left Ear: Tympanic membrane, ear canal and external ear normal.     Nose: Nose normal.     Mouth/Throat:     Pharynx: Uvula midline.  Eyes:     General: Lids are normal.     Conjunctiva/sclera: Conjunctivae normal.     Pupils: Pupils are equal, round, and reactive to light.     Comments: EOMI  Cardiovascular:  Rate and Rhythm: Normal rate and regular rhythm.  Pulmonary:     Effort: Pulmonary effort is normal.     Breath sounds: Normal breath sounds.  Abdominal:     Palpations: Abdomen is soft.     Tenderness: There is no abdominal tenderness. There is no guarding or rebound.  Musculoskeletal:        General: Normal range of motion.     Cervical back: Normal range of motion and neck supple. No tenderness or bony tenderness.  Skin:    General: Skin is warm and dry.  Neurological:     Mental Status: He is alert and oriented to person, place, and time.     GCS: GCS eye subscore is 4. GCS verbal subscore is 5. GCS motor subscore is 6.     Cranial Nerves: No cranial nerve deficit.     Sensory: No sensory deficit.     Motor: No abnormal  muscle tone.     Coordination: Coordination normal.     Gait: Gait normal.     Comments: Patient is able to stand and ambulate, walk to restroom without difficulty.     ED Results / Procedures / Treatments   Labs (all labs ordered are listed, but only abnormal results are displayed) Labs Reviewed  BASIC METABOLIC PANEL - Abnormal; Notable for the following components:      Result Value   CO2 20 (*)    Glucose, Bld 324 (*)    All other components within normal limits  CBC - Abnormal; Notable for the following components:   WBC 10.6 (*)    All other components within normal limits  TROPONIN I (HIGH SENSITIVITY) - Abnormal; Notable for the following components:   Troponin I (High Sensitivity) 40 (*)    All other components within normal limits  TROPONIN I (HIGH SENSITIVITY) - Abnormal; Notable for the following components:   Troponin I (High Sensitivity) 39 (*)    All other components within normal limits    EKG EKG Interpretation Date/Time:  Tuesday March 14 2023 14:09:52 EDT Ventricular Rate:  94 PR Interval:  170 QRS Duration:  78 QT Interval:  350 QTC Calculation: 437 R Axis:   193  Text Interpretation: Normal sinus rhythm anterior lead reversal otherwise no sig change from previous Confirmed by Arby Barrette [phone]) on 03/14/2023 4:55:44 PM  Radiology CT ANGIO HEAD NECK W WO CM  Result Date: 03/14/2023 CLINICAL DATA:  Transient ischemic attack (TIA) intermittent diplopia EXAM: CT ANGIOGRAPHY HEAD AND NECK WITH AND WITHOUT CONTRAST TECHNIQUE: Multidetector CT imaging of the head and neck was performed using the standard protocol during bolus administration of intravenous contrast. Multiplanar CT image reconstructions and MIPs were obtained to evaluate the vascular anatomy. Carotid stenosis measurements (when applicable) are obtained utilizing NASCET criteria, using the distal internal carotid diameter as the denominator. RADIATION DOSE REDUCTION: This exam was performed  according to the departmental dose-optimization program which includes automated exposure control, adjustment of the mA and/or kV according to patient size and/or use of iterative reconstruction technique. CONTRAST:  80mL OMNIPAQUE IOHEXOL 350 MG/ML SOLN COMPARISON:  None Available. FINDINGS: CT HEAD FINDINGS Brain: No evidence of acute infarction, hemorrhage, hydrocephalus, extra-axial collection or mass lesion/mass effect. Vascular: See below. Skull: No acute fracture. Sinuses/Orbits: Moderate right maxillary sinus mucosal thickening and mild scattered ethmoid air cell mucosal thickening. No acute orbital findings. Other: No mastoid effusions. Review of the MIP images confirms the above findings CTA NECK FINDINGS Aortic arch: Great vessel origins are  patent. Right carotid system: Atherosclerosis at the carotid bifurcation involving the proximal ICA without greater than 50% stenosis relative to the distal vessel. Left carotid system: Carotid bifurcation atherosclerosis without greater than 50% stenosis. Vertebral arteries: Severe stenosis of the bilateral vertebral artery origins due to calcific atherosclerosis. Otherwise, vertebral arteries are patent without significant stenosis. Skeleton: No acute abnormality on limited assessment. Other neck: No acute abnormality on limited assessment. Upper chest: Visualized lung apices are clear. Review of the MIP images confirms the above findings CTA HEAD FINDINGS Anterior circulation: Bilateral intracranial ICAs are patent with moderate narrowing bilaterally due to calcific atherosclerosis. Bilateral MCAs and bilateral ACAs are patent without proximal hemodynamically significant stenosis. Posterior circulation: Severe stenosis of the proximal left intradural vertebral artery. Right intradural vertebral artery, basilar artery and both posterior cerebral arteries are patent. Moderate left and mild right P2 PCA stenosis. Venous sinuses: As permitted by contrast timing,  patent. Review of the MIP images confirms the above findings IMPRESSION: 1. No emergent large vessel occlusion. 2. Severe left intradural vertebral artery stenosis. 3. Severe bilateral vertebral artery origin stenosis. 4. Moderate bilateral intracranial ICA stenosis. 5. Moderate left and mild right P2 PCA stenosis. Electronically Signed   By: Feliberto Harts M.D.   On: 03/14/2023 17:55    Procedures Procedures    Medications Ordered in ED Medications  iohexol (OMNIPAQUE) 350 MG/ML injection 100 mL (80 mLs Intravenous Contrast Given 03/14/23 1721)    ED Course/ Medical Decision Making/ A&P    Patient seen and examined. History obtained directly from patient. Work-up including labs, imaging, EKG ordered in triage, if performed, were reviewed.    Labs/EKG: Independently reviewed and interpreted.  This included: CBC showing white blood cell count 10.6 otherwise unremarkable; BMP glucose elevated at 324, normal kidney function, normal electrolytes; first troponin is elevated at 40.  Imaging: Ordered CTA head and neck.  Medications/Fluids: None ordered  Most recent vital signs reviewed and are as follows: BP (!) 166/94   Pulse 72   Temp 99.6 F (37.6 C) (Oral)   Resp 15   Ht 6' (1.829 m)   Wt 100.7 kg   SpO2 99%   BMI 30.11 kg/m   Initial impression: Intermittent episodes of diplopia.  6:46 PM Reassessment performed. Patient appears stable.  I have discussed CT findings with Dr. Amada Jupiter of neurology who agrees that patient should be admitted to the hospital for TIA evaluation, likely starting dual antiplatelet therapy.  Labs personally reviewed and interpreted including: Second troponin was 39  Imaging personally visualized and interpreted including: CTA of the head and neck, agree with radiologist findings.  Reviewed pertinent lab work and imaging with patient at bedside. Questions answered.   Most current vital signs reviewed and are as follows: BP (!) 166/94   Pulse  72   Temp 99.6 F (37.6 C) (Oral)   Resp 15   Ht 6' (1.829 m)   Wt 100.7 kg   SpO2 99%   BMI 30.11 kg/m   Plan: I recommended that patient be admitted to the hospital.  He does not disagree, however he is currently alone with 2 dogs and his fiance does not return until tomorrow evening.  He states that he needs to go home first to take care of his dogs and make sure that they have enough food.  He states that he does not have neighbors or no anyone else in the area that could do this for him.  I advised that he be admitted directly to  the hospital, however he declines.  I told him that I was concerned about what could happen if he had another event after leaving the ED.  He states that he is willing to return for admission after he takes care of his errands.  I encouraged him to return to [facility] for admission or go to the nearest emergency department when he decides to come back.                               Medical Decision Making Amount and/or Complexity of Data Reviewed Labs: ordered. Radiology: ordered.   Patient with stuttering recurrent episodes of unsteadiness and binocular diplopia in setting of abnormalities on CTA head and neck today.  Very high likelihood of TIA.  Recommended that patient be admitted for risk factor modification.  Patient case discussed with on-call neurology.  Patient has elected to leave AGAINST MEDICAL ADVICE until he can take care of his pets, however he seems motivated to return after that.  Patient also has elevated troponins, however flat trend.  Unclear etiology.  Patient has mild hypertension, no chronic kidney disease.  No signs of ischemia and low concern for active ACS, PE at this time.        Final Clinical Impression(s) / ED Diagnoses Final diagnoses:  TIA (transient ischemic attack)  Elevated troponin    Rx / DC Orders ED Discharge Orders     None         Renne Crigler, PA-C 03/14/23 1851    Arby Barrette, MD 03/16/23 1322

## 2023-03-14 NOTE — ED Triage Notes (Signed)
  Patient here POV from Home.   Endorses Dizziness and Double Vision intermittently for a week. No CP or SOB. Dizziness is described as "Room-Spinning". Sent by [facility] for Abnormal ECG.    NAD Noted during Triage. A&Ox4. Gcs 15. Ambulatory.   Was sent here by [facility] for admission previous triage note as seen above from earlier today

## 2023-03-14 NOTE — ED Triage Notes (Signed)
Patient here POV from Home.  Endorses Dizziness and Double Vision intermittently for a week. No CP or SOB. Dizziness is described as "Room-Spinning". Sent by [facility] for Abnormal ECG.   NAD Noted during Triage. A&Ox4. Gcs 15. Ambulatory.

## 2023-03-14 NOTE — H&P (Signed)
History and Physical    Devin Ellison:[id number] DOB: 1956/05/12 DOA: 03/14/2023  PCP: Farris Has, MD   Patient coming from: Home   Chief Complaint:  Chief Complaint  Patient presents with   Transient Ischemic Attack    HPI:  67 year old man medical history of diabetes mellitus type 2, hypertension, chronic smoker presented to emergency with complaining of intermittent double vision.  Patient reported symptoms has been occurring approximate daily for past 1 week however happened 2-3 times today.  These occur randomly and last about 3 to 5 minutes.  Today he was watching TV when suddenly developed diplopia which is resolved within 3-minute.  Yesterday he had another episode while he was in the [employer] standing in the line and developed diplopia for 2 to 3 minutes.  He states that he has to stand in a corner to help him to keep imbalance until episode resolves.  Patient denies any associated headache, tingling, numbness, facial droop, facial weakness, upper and lower extremities weakness, chest pain and palpation.  Denies any dizziness, presyncope, syncope and seizure. No previous history of stroke and seizure.  Patient is reporting me that for last few weeks he is not taking any medication at all as he has financial problem and cannot afford any medication..   Patient originally went to urgent care and then he was referred to [facility] emergency department.  At [facility] patient was diagnosed with TIA and encouraged to be admitted but he left due to runs of errands.  Patient returned to [facility] emergency department later  in the night around 8 PM for evaluation.   ED Course:  Initial presentation to ED patient blood pressure 176/104, heart rate 100, respiratory 17 and O2 sat 96% on room air.  Blood pressure improved gradually by itself most recent is 126/86.  CMP sodium 135, potassium 3.7, chloride 103, low bicarb 20, blood glucose 134 otherwise  unremarkable. CBC slightly elevated WBC count 10.6. Elevated troponin 39 and 40, EKG showed sinus rhythm without any ST T wave abnormality.  Patient is chest pain-free.  With the concern for stroke CT head obtained which showed no emergent large vessel occlusion.  Severe left intradural vertebral artery stenosis, severe bilateral vertebral artery stenosis, moderate bilateral intracranial ICA stenosis, moderate left and mild P2 PCA stenosis.  Review of Systems:  Review of Systems  Constitutional:  Negative for chills, fever, malaise/fatigue and weight loss.  HENT:  Negative for hearing loss and tinnitus.   Eyes:  Positive for blurred vision. Negative for double vision, photophobia and pain.  Respiratory:  Negative for cough.   Cardiovascular:  Negative for chest pain, palpitations, orthopnea and leg swelling.  Gastrointestinal:  Negative for abdominal pain, heartburn, nausea and vomiting.  Musculoskeletal:  Negative for back pain and neck pain.  Neurological:  Negative for dizziness, tingling, tremors, sensory change, speech change, focal weakness, seizures, loss of consciousness, weakness and headaches.  Psychiatric/Behavioral:  The patient is not nervous/anxious.     Past Medical History:  Diagnosis Date   Bipolar I disorder in remission    Last Assessment & Plan:  Patient voluntarily presents for evaluation citing increasingly reckless behavior over the past week, initially diagnosed with bipolar disorder 2014 has been managed on lamotrigine, Seroquel, stated he does not want to wake up in the morning but denies any plans -Evaluated by psychiatry in ED, IVC'd -Seroquel 300 mg   CKD (chronic kidney disease) 05/16/2021   Last Assessment & Plan: Renal fxn stable  Dermatitis of left ear canal 10/14/2018   Essential hypertension 03/13/2017   Gastroesophageal reflux disease without esophagitis 05/20/2021   Generalized anxiety disorder    Hardening of the aorta (main artery of the heart)     Hearing loss    Hyperlipidemia 05/20/2021   Major depressive disorder 04/30/2013   Obstructive sleep apnea    No CPAP use   Recurrent colon polyp s/p robotic colectomy 04/11/2016   Type II diabetes mellitus, noninsulin dependent 05/16/2021   Last Assessment & Plan: Restart on metformin at 500mg  bid, titrate -stable    Past Surgical History:  Procedure Laterality Date   ANKLE SURGERY Right    COLON SURGERY     polypectomy   HAND SURGERY Right    after fx   WISDOM TOOTH EXTRACTION       reports that he has been smoking cigarettes. He has been smoking an average of 1 pack per day. He has never used smokeless tobacco. He reports that he does not currently use alcohol. He reports that he does not use drugs.  No Known Allergies  Family History  Problem Relation Age of Onset   Heart disease Mother    Cerebral palsy Sister 13   Leukemia Sister    Breast cancer Sister    Depression Paternal Uncle    Bipolar disorder Paternal Uncle    Suicidality Paternal Aunt     Prior to Admission medications   Medication Sig Start Date End Date Taking? Authorizing Provider  amLODipine (NORVASC) 10 MG tablet Take 10 mg by mouth daily. 03/06/21  Yes [provider]  atorvastatin (LIPITOR) 10 MG tablet Take 10 mg by mouth daily. 03/24/21  Yes [provider]  divalproex (DEPAKOTE ER) 500 MG 24 hr tablet Take 2,000 mg by mouth daily. 05/26/21  Yes [provider]  empagliflozin (JARDIANCE) 10 MG TABS tablet Take 10 mg by mouth daily.   Yes [provider]  lamoTRIgine (LAMICTAL) 100 MG tablet Take 100 mg by mouth 2 (two) times daily. 06/21/21  Yes [provider]  losartan (COZAAR) 100 MG tablet TAKE 1 TABLET BY MOUTH EVERY DAY 06/12/20  Yes Lezlie Lye, Irma M, MD  lurasidone (LATUDA) 20 MG TABS tablet Take 20 mg by mouth every evening.   Yes [provider]  metFORMIN (GLUCOPHAGE) 1000 MG tablet Take 1,000 mg by mouth 2 (two) times daily with a  meal. 03/24/21  Yes [provider]     Physical Exam: Vitals:   03/15/23 0100 03/15/23 0125 03/15/23 0200 03/15/23 0300  BP: 133/88 (!) 156/93 (!) 136/101 (!) 133/92  Pulse: (!) 57 (!) 59 61 (!) 58  Resp:      Temp:  97.9 F (36.6 C)    TempSrc:  Oral    SpO2: 99% 100% 100% 96%    Physical Exam Constitutional:      Appearance: Normal appearance.  HENT:     Head: Normocephalic.     Mouth/Throat:     Mouth: Mucous membranes are moist.  Cardiovascular:     Rate and Rhythm: Normal rate and regular rhythm.     Pulses: Normal pulses.     Heart sounds: Normal heart sounds.  Pulmonary:     Effort: Pulmonary effort is normal.     Breath sounds: Normal breath sounds.  Abdominal:     General: Bowel sounds are normal.  Musculoskeletal:     Cervical back: Normal range of motion and neck supple.     Right lower leg:  No edema.     Left lower leg: No edema.  Neurological:     Mental Status: He is alert and oriented to person, place, and time.     Cranial Nerves: No cranial nerve deficit.     Sensory: No sensory deficit.     Motor: No weakness.     Coordination: Coordination normal.  Psychiatric:        Thought Content: Thought content normal.        Judgment: Judgment normal.     Comments: Depressed mood.      Labs on Admission: I have personally reviewed following labs and imaging studies  CBC: Recent Labs  Lab 03/14/23 1415 03/15/23 0001  WBC 10.6* 9.6  HGB 15.5 14.9  HCT 45.1 44.1  MCV 89.7 90.9  PLT 256 225   Basic Metabolic Panel: Recent Labs  Lab 03/14/23 1415 03/15/23 0001  NA 135 136  K 3.7 3.7  CL 103 105  CO2 20* 23  GLUCOSE 324* 187*  BUN 9 11  CREATININE 0.98 0.99  CALCIUM 9.3 8.7*   GFR: Estimated Creatinine Clearance: 87.7 mL/min (by C-G formula based on SCr of 0.99 mg/dL). Liver Function Tests: Recent Labs  Lab 03/15/23 0001  AST 17  ALT 17  ALKPHOS 97  BILITOT 0.5  PROT 6.4*  ALBUMIN 3.6   No results for input(s):  "LIPASE", "AMYLASE" in the last 168 hours. No results for input(s): "AMMONIA" in the last 168 hours. Coagulation Profile: No results for input(s): "INR", "PROTIME" in the last 168 hours. Cardiac Enzymes: Recent Labs  Lab 03/14/23 1415 03/14/23 1700 03/14/23 2300 03/15/23 0112  TROPONINIHS 40* 39* 59* 56*   BNP (last 3 results) No results for input(s): "BNP" in the last 8760 hours. HbA1C: Recent Labs    03/14/23 2300  HGBA1C 9.0*   CBG: No results for input(s): "GLUCAP" in the last 168 hours. Lipid Profile: Recent Labs    03/14/23 2300  CHOL 193  HDL 40*  LDLCALC 97  TRIG 147*  CHOLHDL 4.8   Thyroid Function Tests: No results for input(s): "TSH", "T4TOTAL", "FREET4", "T3FREE", "THYROIDAB" in the last 72 hours. Anemia Panel: No results for input(s): "VITAMINB12", "FOLATE", "FERRITIN", "TIBC", "IRON", "RETICCTPCT" in the last 72 hours. Urine analysis: No results found for: "COLORURINE", "APPEARANCEUR", "LABSPEC", "PHURINE", "GLUCOSEU", "HGBUR", "BILIRUBINUR", "KETONESUR", "PROTEINUR", "UROBILINOGEN", "NITRITE", "LEUKOCYTESUR"  Radiological Exams on Admission: I have personally reviewed images CT ANGIO HEAD NECK W WO CM  Result Date: 03/14/2023 CLINICAL DATA:  Transient ischemic attack (TIA) intermittent diplopia EXAM: CT ANGIOGRAPHY HEAD AND NECK WITH AND WITHOUT CONTRAST TECHNIQUE: Multidetector CT imaging of the head and neck was performed using the standard protocol during bolus administration of intravenous contrast. Multiplanar CT image reconstructions and MIPs were obtained to evaluate the vascular anatomy. Carotid stenosis measurements (when applicable) are obtained utilizing NASCET criteria, using the distal internal carotid diameter as the denominator. RADIATION DOSE REDUCTION: This exam was performed according to the departmental dose-optimization program which includes automated exposure control, adjustment of the mA and/or kV according to patient size and/or use of  iterative reconstruction technique. CONTRAST:  80mL OMNIPAQUE IOHEXOL 350 MG/ML SOLN COMPARISON:  None Available. FINDINGS: CT HEAD FINDINGS Brain: No evidence of acute infarction, hemorrhage, hydrocephalus, extra-axial collection or mass lesion/mass effect. Vascular: See below. Skull: No acute fracture. Sinuses/Orbits: Moderate right maxillary sinus mucosal thickening and mild scattered ethmoid air cell mucosal thickening. No acute orbital findings. Other: No mastoid effusions. Review of the MIP images confirms the above findings  CTA NECK FINDINGS Aortic arch: Great vessel origins are patent. Right carotid system: Atherosclerosis at the carotid bifurcation involving the proximal ICA without greater than 50% stenosis relative to the distal vessel. Left carotid system: Carotid bifurcation atherosclerosis without greater than 50% stenosis. Vertebral arteries: Severe stenosis of the bilateral vertebral artery origins due to calcific atherosclerosis. Otherwise, vertebral arteries are patent without significant stenosis. Skeleton: No acute abnormality on limited assessment. Other neck: No acute abnormality on limited assessment. Upper chest: Visualized lung apices are clear. Review of the MIP images confirms the above findings CTA HEAD FINDINGS Anterior circulation: Bilateral intracranial ICAs are patent with moderate narrowing bilaterally due to calcific atherosclerosis. Bilateral MCAs and bilateral ACAs are patent without proximal hemodynamically significant stenosis. Posterior circulation: Severe stenosis of the proximal left intradural vertebral artery. Right intradural vertebral artery, basilar artery and both posterior cerebral arteries are patent. Moderate left and mild right P2 PCA stenosis. Venous sinuses: As permitted by contrast timing, patent. Review of the MIP images confirms the above findings IMPRESSION: 1. No emergent large vessel occlusion. 2. Severe left intradural vertebral artery stenosis. 3. Severe  bilateral vertebral artery origin stenosis. 4. Moderate bilateral intracranial ICA stenosis. 5. Moderate left and mild right P2 PCA stenosis. Electronically Signed   By: Feliberto Harts M.D.   On: 03/14/2023 17:55    EKG: My personal interpretation of EKG shows: Normal sinus rhythm without any ST and T wave abnormality.     Assessment/Plan: Principal Problem:   TIA (transient ischemic attack) Active Problems:   Essential hypertension   Non-insulin dependent type 2 diabetes mellitus (HCC)   Elevated troponin   Vertebral artery stenosis    Assessment and Plan: Transient ischemic attack Bilateral Bharti pleural artery stenosis Left intradural vertebral artery stenosis Moderate bilateral intracranial ICA stenosis Moderate left and mild PCA stenosis -Patient coming in complaining of intermittent episodes of double vision which last for 2 to 3-minute and resolved itself.  Associated with balance issue while standing during this episode.  Patient does not have any other symptoms. - CT head ruled out a stroke however significant for Severe left intradural vertebral artery stenosis, severe bilateral vertebral artery stenosis, moderate bilateral intracranial ICA stenosis, moderate left and mild P2 PCA stenosis. -Patient is currently symptom-free.  Blood pressure initial presentation elevated 176/104 which improved his cell. - Blurry vision, imbalance and double vision explains by in the setting of severe vertebral artery stenosis and other intracranial artery stenosis as well.   -Stroke ruled out and concern for TIA. -EKG showed normal sinus rhythm no evidence of arrhythmia. - Admitting patient overnight for TIA workup -Checking orthostatic vitals. - Obtaining 2D echo -Check lipid panel and A1c -Starting aspirin 81 mg daily and Lipitor 40 mg daily - Neurocheck every 4 hours. - Continue fall precaution and will do bedside swallow evaluation. -Consulted PT  vestibular evaluation for balance  issue. -Continue telemonitoring overnight for any arrhythmia -Will follow-up with echocardiogram.  On discharge patient will need Holter monitor/Zio patch -As patient is symptomatic in the setting of severe multivessel intracranial and vertebral arteries I think he need to see by vascular surgery before discharge.  Given he does not have any insurance and not taking any medication outside due to financial constraints I will not believe if I refer him on discharge he will actually able to afford to see a vascular surgery outpatient. -Please reach out to on-call vascular surgery in the a.m.  History of essential hypertension Elevated blood pressure -Patient is noncompliant with home  blood pressure regimen for last couple of weeks/month due to financial constraint. - Patient used to take amlodipine 10 mg daily and losartan 100 mg daily. -At initial presentation blood pressure 176/104 however after other reading showed blood pressure around systolic 120 to 140 and diastolic 80 to 90. Not sure initial blood pressure reading was accurate or not - Restarting low-dose amlodipine 5 mg daily.  Will adjust blood pressure regimen based on blood pressure numbers.   Elevated troponin Troponin peaked to 59 now trending down. - EKG showed normal sinus rhythm - Patient has no chest pain - ACS ruled out. - Elevated troponin from demand ischemia in the setting of elevated blood pressure - Starting aspirin 81 mg daily.   History of diabetes mellitus type 2 - At home patient is on Jardiance 10 mg daily and metformin 1000 mg twice daily - Elevated A1c 9. - Resuming Jardiance.  Holding metformin.  If renal function remained stable in the morning can resume metformin on discharge - Starting sliding scale SSI and POC glucose check with meals with carb consistent diet    History of generalized anxiety disorder - At home patient used to take Lamictal 100 mg twice daily and Latuda 20 mg in the  evening. -Currently not on any medication at home as patient stating that he feels that he does not need any GAD medication. -During my evaluation I feel like that patient is very depressed. -Consult psychiatry in a.m. for GAD/depression evaluation  DVT prophylaxis: SCDs and Lovenox Code Status: Full Code Diet: Heart healthy and carb modified diet Family Communication:  none Disposition Plan: Plan to discharge home in 1 to 2 days Consults: Vascular surgery, PT, PT vestibular and psychiatry Admission status: Observation, Telemetry bed  Severity of Illness: The appropriate patient status for this patient is INPATIENT. Inpatient status is judged to be reasonable and necessary in order to provide the required intensity of service to ensure the patient's safety. The patient's presenting symptoms, physical exam findings, and initial radiographic and laboratory data in the context of their chronic comorbidities is felt to place them at high risk for further clinical deterioration. Furthermore, it is not anticipated that the patient will be medically stable for discharge from the hospital within 2 midnights of admission.   * I certify that at the point of admission it is my clinical judgment that the patient will require inpatient hospital care spanning beyond 2 midnights from the point of admission due to high intensity of service, high risk for further deterioration and high frequency of surveillance required.Marland Kitchen    Tereasa Coop MD [facility]  How to contact the [facility] Attending or Consulting provider 7A - 7P or covering provider during after hours 7P -7A, for this patient?   Check the care team in [facility] and look for a) attending/consulting [facility] provider listed and b) the [facility] team listed Log into [email or web address] and use [facility] universal password to access. If you do not have the password, please contact the hospital operator. Locate the [facility] provider you are looking for under [facility] and page to a number that you can be directly reached. If you still have difficulty reaching the provider, please page the [facility] (Director on Call) for the Hospitalists listed on amion for assistance.  03/15/2023, 3:23 AM

## 2023-03-14 NOTE — Discharge Instructions (Addendum)
As we discussed, we are concerned that you having mini strokes causing your symptoms.  Please go to [facility] or return to the nearest emergency department when able for admission.

## 2023-03-15 ENCOUNTER — Encounter (HOSPITAL_COMMUNITY): Payer: Self-pay | Admitting: Internal Medicine

## 2023-03-15 ENCOUNTER — Observation Stay (HOSPITAL_COMMUNITY): Payer: PPO

## 2023-03-15 ENCOUNTER — Emergency Department (HOSPITAL_COMMUNITY): Payer: PPO

## 2023-03-15 ENCOUNTER — Other Ambulatory Visit (HOSPITAL_COMMUNITY): Payer: Self-pay

## 2023-03-15 DIAGNOSIS — F3177 Bipolar disorder, in partial remission, most recent episode mixed: Secondary | ICD-10-CM

## 2023-03-15 DIAGNOSIS — G459 Transient cerebral ischemic attack, unspecified: Secondary | ICD-10-CM | POA: Diagnosis not present

## 2023-03-15 DIAGNOSIS — J320 Chronic maxillary sinusitis: Secondary | ICD-10-CM | POA: Diagnosis not present

## 2023-03-15 DIAGNOSIS — I672 Cerebral atherosclerosis: Secondary | ICD-10-CM | POA: Diagnosis not present

## 2023-03-15 LAB — COMPREHENSIVE METABOLIC PANEL
ALT: 17 U/L (ref 0–44)
AST: 17 U/L (ref 15–41)
Albumin: 3.6 g/dL (ref 3.5–5.0)
Alkaline Phosphatase: 97 U/L (ref 38–126)
Anion gap: 8 (ref 5–15)
BUN: 11 mg/dL (ref 8–23)
CO2: 23 mmol/L (ref 22–32)
Calcium: 8.7 mg/dL — ABNORMAL LOW (ref 8.9–10.3)
Chloride: 105 mmol/L (ref 98–111)
Creatinine, Ser: 0.99 mg/dL (ref 0.61–1.24)
GFR, Estimated: >60 mL/min (ref >60)
Glucose, Bld: 187 mg/dL — ABNORMAL HIGH (ref 70–99)
Potassium: 3.7 mmol/L (ref 3.5–5.1)
Sodium: 136 mmol/L (ref 135–145)
Total Bilirubin: 0.5 mg/dL (ref 0.3–1.2)
Total Protein: 6.4 g/dL — ABNORMAL LOW (ref 6.5–8.1)

## 2023-03-15 LAB — CBC
HCT: 44.1 % (ref 39.0–52.0)
Hemoglobin: 14.9 g/dL (ref 13.0–17.0)
MCH: 30.7 pg (ref 26.0–34.0)
MCHC: 33.8 g/dL (ref 30.0–36.0)
MCV: 90.9 fL (ref 80.0–100.0)
Platelets: 225 10*3/uL (ref 150–400)
RBC: 4.85 MIL/uL (ref 4.22–5.81)
RDW: 12.7 % (ref 11.5–15.5)
WBC: 9.6 10*3/uL (ref 4.0–10.5)
nRBC: 0 % (ref 0.0–0.2)

## 2023-03-15 LAB — GLUCOSE, CAPILLARY
Glucose-Capillary: 138 mg/dL — ABNORMAL HIGH (ref 70–99)
Glucose-Capillary: 194 mg/dL — ABNORMAL HIGH (ref 70–99)

## 2023-03-15 LAB — CBG MONITORING, ED
Glucose-Capillary: 174 mg/dL — ABNORMAL HIGH (ref 70–99)
Glucose-Capillary: 233 mg/dL — ABNORMAL HIGH (ref 70–99)
Glucose-Capillary: 135 mg/dL — ABNORMAL HIGH (ref 70–99)

## 2023-03-15 LAB — HIV ANTIBODY (ROUTINE TESTING W REFLEX): HIV Screen 4th Generation wRfx: NONREACTIVE

## 2023-03-15 LAB — TROPONIN I (HIGH SENSITIVITY): Troponin I (High Sensitivity): 56 ng/L — ABNORMAL HIGH (ref <18)

## 2023-03-15 MED ORDER — INSULIN ASPART 100 UNIT/ML IJ SOLN
0.0000 [IU] | Freq: Three times a day (TID) | INTRAMUSCULAR | Status: DC
  Administered 2023-03-15: 2 [IU] via SUBCUTANEOUS
  Administered 2023-03-15: 1 [IU] via SUBCUTANEOUS
  Administered 2023-03-16: 2 [IU] via SUBCUTANEOUS

## 2023-03-15 MED ORDER — GADOBUTROL 1 MMOL/ML IV SOLN
10.0000 mL | Freq: Once | INTRAVENOUS | Status: AC | PRN
  Administered 2023-03-15: 10 mL via INTRAVENOUS

## 2023-03-15 MED ORDER — EMPAGLIFLOZIN 10 MG PO TABS
10.0000 mg | ORAL_TABLET | Freq: Every day | ORAL | Status: DC
  Administered 2023-03-15 – 2023-03-16 (×2): 10 mg via ORAL
  Filled 2023-03-15 (×2): qty 1

## 2023-03-15 MED ORDER — INSULIN ASPART 100 UNIT/ML IJ SOLN
0.0000 [IU] | Freq: Three times a day (TID) | INTRAMUSCULAR | Status: DC
Start: 2023-03-15 — End: 2023-03-15

## 2023-03-15 NOTE — Evaluation (Signed)
Occupational Therapy Evaluation and Discharge Summary Patient Details Name: Devin Ellison MRN: [id number] DOB: 28-Jun-1956 Today's Date: 03/15/2023   History of Present Illness Pt is a 67 yo male who has reports of double vision 2-3x a day for past day (less frequent in the last week) that lasts 3-5 minutes each time. Pt experiences dizziness during episodes and feels weak.  Pt found to have B vertebral stenosis, L intradural artery stenosis, B iCA stenosis and PCA stenosis.  PMH: DM2, HTN, previous smoker.   Clinical Impression   Pt admitted with the above diagnosis and essentially has returned to baseline. Pt is not currently experiencing double vision during this exam and further visual testing shows mild L peripheral vision loss which pt seems to think is long standing. Pt is currently at baseline but does feel slightly tired and weak. No further acute OT needed.  Pt educated in signs and symptoms of a stroke through [facility] and verbalized understanding.  No further needs at this time.      Recommendations for follow up therapy are one component of a multi-disciplinary discharge planning process, led by the attending physician.  Recommendations may be updated based on patient status, additional functional criteria and insurance authorization.   Assistance Recommended at Discharge None  Patient can return home with the following Other (comment) (discuss driving with MD)    Functional Status Assessment  Patient has not had a recent decline in their functional status  Equipment Recommendations  None recommended by OT    Recommendations for Other Services       Precautions / Restrictions Precautions Precautions: None Restrictions Weight Bearing Restrictions: No      Mobility Bed Mobility Overal bed mobility: Independent                  Transfers Overall transfer level: Independent Equipment used: None               General transfer comment: no assist  needed      Balance Overall balance assessment: Modified Independent                                         ADL either performed or assessed with clinical judgement   ADL Overall ADL's : Independent                                       General ADL Comments: No deficits noted. Pt states he feels weak and tired but was steady when doing adls and ambulating.     Vision Baseline Vision/History: 1 Wears glasses Ability to See in Adequate Light: 0 Adequate Patient Visual Report: No change from baseline Vision Assessment?: Yes Eye Alignment: Within Functional Limits Ocular Range of Motion: Within Functional Limits Alignment/Gaze Preference: Within Defined Limits Tracking/Visual Pursuits: Able to track stimulus in all quads without difficulty Saccades: Within functional limits Convergence: Within functional limits Visual Fields: Left visual field deficit Diplopia Assessment: Other (comment) (not present at time of assessment) Additional Comments: Pt with no double vision during assessment. Pt states it goes away when he covers one eye when it does happen. Pt with possible periperal vision loss on the L. Pt did not see objects coming from the L until about 35 degrees from midline.     Perception Perception Perception  Tested?: Yes   Praxis Praxis Praxis tested?: Within functional limits    Pertinent Vitals/Pain Pain Assessment Pain Assessment: No/denies pain     Hand Dominance Left   Extremity/Trunk Assessment Upper Extremity Assessment Upper Extremity Assessment: Overall WFL for tasks assessed   Lower Extremity Assessment Lower Extremity Assessment: Defer to PT evaluation   Cervical / Trunk Assessment Cervical / Trunk Assessment: Normal   Communication Communication Communication: No difficulties   Cognition Arousal/Alertness: Awake/alert Behavior During Therapy: WFL for tasks assessed/performed Overall Cognitive Status: Within  Functional Limits for tasks assessed                                 General Comments: appears fully intact     General Comments  Pt appears to be at or close to baseline with adls and functional mobilty.  Pt stated he did feel weak and could use some leg exercises.    Exercises     Shoulder Instructions      Home Living Family/patient expects to be discharged to:: Private residence Living Arrangements: Alone Available Help at Discharge: Other (Comment) (none) Type of Home: Apartment Home Access: Level entry     Home Layout: One level     Bathroom Shower/Tub: Chief Strategy Officer: Standard     Home Equipment: None          Prior Functioning/Environment Prior Level of Function : Independent/Modified Independent             Mobility Comments: independent ADLs Comments: independent        OT Problem List: Decreased strength      OT Treatment/Interventions:      OT Goals(Current goals can be found in the care plan section) Acute Rehab OT Goals Patient Stated Goal: to go home to my dogs OT Goal Formulation: All assessment and education complete, DC therapy  OT Frequency:      Co-evaluation              AM-PAC OT "6 Clicks" Daily Activity     Outcome Measure Help from another person eating meals?: None Help from another person taking care of personal grooming?: None Help from another person toileting, which includes using toliet, bedpan, or urinal?: None Help from another person bathing (including washing, rinsing, drying)?: None Help from another person to put on and taking off regular upper body clothing?: None Help from another person to put on and taking off regular lower body clothing?: None 6 Click Score: 24   End of Session Nurse Communication: Mobility status  Activity Tolerance: Patient tolerated treatment well Patient left: in chair;with call bell/phone within reach  OT Visit Diagnosis: Unsteadiness on feet  (R26.81)                Time: 4098-1191 OT Time Calculation (min): 17 min Charges:  OT General Charges $OT Visit: 1 Visit OT Evaluation $OT Eval Low Complexity: 1 Low  Hope Budds 03/15/2023, 10:15 AM

## 2023-03-15 NOTE — ED Notes (Signed)
Pt received for care at 0300.  Pt resting in bed with eyes closed at this time; respirations even and unlabored.  Bed in lowest position, wheels locked.  Call bell within reach.

## 2023-03-15 NOTE — Consult Note (Signed)
Chief Complaint: Dizziness and diplopia  Referring Provider(s): Lanae Boast  Supervising Physician: Julieanne Cotton  Patient Status: [facility] - In-pt  History of Present Illness: Devin Ellison is a 67 y.o. male with type 2 diabetes and hypertension who has been having diplopia and dizziness since 03/07/23.  He is smoker and states "I smoke as much as I can because I'm dealing with some things".  He has not been taking prescribed meds because he states he cannot afford them.  CTA done yesterday showed= 1. No emergent large vessel occlusion. 2. Severe left intradural vertebral artery stenosis. 3. Severe bilateral vertebral artery origin stenosis. 4. Moderate bilateral intracranial ICA stenosis. 5. Moderate left and mild right P2 PCA stenosis.  MRI negative for acute stroke.  NIR is asked to evaluate for possible intervention.  No nausea/vomiting. No Fever/chills. Otherwise ROS negative.  Patient is Full Code  Past Medical History:  Diagnosis Date   Bipolar I disorder in remission    Last Assessment & Plan:  Patient voluntarily presents for evaluation citing increasingly reckless behavior over the past week, initially diagnosed with bipolar disorder 2014 has been managed on lamotrigine, Seroquel, stated he does not want to wake up in the morning but denies any plans -Evaluated by psychiatry in ED, IVC'd -Seroquel 300 mg   CKD (chronic kidney disease) 05/16/2021   Last Assessment & Plan: Renal fxn stable   Dermatitis of left ear canal 10/14/2018   Essential hypertension 03/13/2017   Gastroesophageal reflux disease without esophagitis 05/20/2021   Generalized anxiety disorder    Hardening of the aorta (main artery of the heart)    Hearing loss    Hyperlipidemia 05/20/2021   Major depressive disorder 04/30/2013   Obstructive sleep apnea    No CPAP use   Recurrent colon polyp s/p robotic colectomy 04/11/2016   Type II diabetes mellitus, noninsulin dependent 05/16/2021    Last Assessment & Plan: Restart on metformin at 500mg  bid, titrate -stable    Past Surgical History:  Procedure Laterality Date   ANKLE SURGERY Right    COLON SURGERY     polypectomy   HAND SURGERY Right    after fx   WISDOM TOOTH EXTRACTION      Allergies: Patient has no known allergies.  Medications: Prior to Admission medications   Medication Sig Start Date End Date Taking? Authorizing Provider  amLODipine (NORVASC) 10 MG tablet Take 10 mg by mouth daily. 03/06/21  Yes [provider]  atorvastatin (LIPITOR) 10 MG tablet Take 10 mg by mouth daily. 03/24/21  Yes [provider]  divalproex (DEPAKOTE ER) 500 MG 24 hr tablet Take 2,000 mg by mouth daily. 05/26/21  Yes [provider]  empagliflozin (JARDIANCE) 10 MG TABS tablet Take 10 mg by mouth daily.   Yes [provider]  lamoTRIgine (LAMICTAL) 100 MG tablet Take 100 mg by mouth 2 (two) times daily. 06/21/21  Yes [provider]  losartan (COZAAR) 100 MG tablet TAKE 1 TABLET BY MOUTH EVERY DAY 06/12/20  Yes Lezlie Lye, Irma M, MD  lurasidone (LATUDA) 20 MG TABS tablet Take 20 mg by mouth every evening.   Yes [provider]  metFORMIN (GLUCOPHAGE) 1000 MG tablet Take 1,000 mg by mouth 2 (two) times daily with a meal. 03/24/21  Yes [provider]     Family History  Problem Relation Age of Onset   Heart disease Mother    Cerebral palsy Sister 49   Leukemia Sister  Breast cancer Sister    Depression Paternal Uncle    Bipolar disorder Paternal Uncle    Suicidality Paternal Aunt     Social History   Socioeconomic History   Marital status: Legally Separated    Spouse name: Not on file   Number of children: 2   Years of education: 6   Highest education level: Professional school degree (e.g., MD, DDS, DVM, JD)  Occupational History   Occupation: Unemployed    Comment: Former attorney  Tobacco Use   Smoking status: Every Day    Packs/day: 1     Types: Cigarettes    Last attempt to quit: 08/13/2013    Years since quitting: 9.5   Smokeless tobacco: Never  Vaping Use   Vaping Use: Never used  Substance and Sexual Activity   Alcohol use: Not Currently    Comment: 6 beers/week   Drug use: No   Sexual activity: Not on file  Other Topics Concern   Not on file  Social History Narrative   Not on file   Social Determinants of Health   Financial Resource Strain: Not on file  Food Insecurity: No Food Insecurity (03/15/2023)   Hunger Vital Sign    Worried About Running Out of Food in the Last Year: Never true    Ran Out of Food in the Last Year: Never true  Transportation Needs: No Transportation Needs (03/15/2023)   PRAPARE - Administrator, Civil Service (Medical): No    Lack of Transportation (Non-Medical): No  Physical Activity: Not on file  Stress: Not on file  Social Connections: Not on file     Review of Systems: A 12 point ROS discussed and pertinent positives are indicated in the HPI above.  All other systems are negative.  Review of Systems  Vital Signs: BP (!) 143/92 (BP Location: Right Arm)   Pulse (!) 52   Temp 98.2 F (36.8 C) (Oral)   Resp 15   SpO2 98%   Advance Care Plan: The advanced care place/surrogate decision maker was discussed at the time of visit and the patient did not wish to discuss or was not able to name a surrogate decision maker or provide an advance care plan.  Physical Exam Vitals reviewed.  Constitutional:      Appearance: Normal appearance.  HENT:     Head: Normocephalic and atraumatic.  Eyes:     Extraocular Movements: Extraocular movements intact.  Cardiovascular:     Rate and Rhythm: Normal rate and regular rhythm.  Pulmonary:     Effort: Pulmonary effort is normal. No respiratory distress.     Breath sounds: Normal breath sounds.  Abdominal:     General: There is no distension.     Palpations: Abdomen is soft.     Tenderness: There is no abdominal tenderness.   Musculoskeletal:        General: Normal range of motion.     Cervical back: Normal range of motion.  Skin:    General: Skin is warm and dry.  Neurological:     General: No focal deficit present.     Mental Status: He is alert and oriented to person, place, and time.  Psychiatric:        Mood and Affect: Mood normal.        Behavior: Behavior normal.        Thought Content: Thought content normal.        Judgment: Judgment normal.     Imaging: MR  BRAIN W WO CONTRAST  Result Date: 03/15/2023 CLINICAL DATA:  Ophthalmoplegia. EXAM: MRI HEAD AND ORBITS WITHOUT AND WITH CONTRAST TECHNIQUE: Multiplanar, multiecho pulse sequences of the brain and surrounding structures were obtained without and with intravenous contrast. Multiplanar, multiecho pulse sequences of the orbits and surrounding structures were obtained including fat saturation techniques, before and after intravenous contrast administration. CONTRAST:  10mL GADAVIST GADOBUTROL 1 MMOL/ML IV SOLN COMPARISON:  CTA head/neck 03/14/2023. FINDINGS: MRI HEAD FINDINGS Brain: No acute infarct or hemorrhage. Mild chronic small-vessel disease. No hydrocephalus or extra-axial collection. No foci of abnormal susceptibility. No mass or abnormal enhancement. Vascular: Normal flow voids. Intracranial atherosclerosis is better evaluated on previous day's head CTA. Skull and upper cervical spine: Normal marrow signal and enhancement. Other: None. MRI ORBITS FINDINGS Orbits: No traumatic or inflammatory finding. Globes, optic nerves, orbital fat, extraocular muscles, vascular structures, and lacrimal glands are normal. Visualized sinuses: Moderate mucosal disease in the right maxillary sinus and bilateral anterior ethmoid air cells. Soft tissues: Unremarkable. IMPRESSION: 1. No acute intracranial abnormality or mass. 2. Mild chronic small-vessel disease. 3. Unremarkable MRI of the orbits. 4. Moderate right maxillary sinus disease. Electronically Signed   By:  Orvan Falconer M.D.   On: 03/15/2023 12:41   MR ORBITS W WO CONTRAST  Result Date: 03/15/2023 CLINICAL DATA:  Ophthalmoplegia. EXAM: MRI HEAD AND ORBITS WITHOUT AND WITH CONTRAST TECHNIQUE: Multiplanar, multiecho pulse sequences of the brain and surrounding structures were obtained without and with intravenous contrast. Multiplanar, multiecho pulse sequences of the orbits and surrounding structures were obtained including fat saturation techniques, before and after intravenous contrast administration. CONTRAST:  10mL GADAVIST GADOBUTROL 1 MMOL/ML IV SOLN COMPARISON:  CTA head/neck 03/14/2023. FINDINGS: MRI HEAD FINDINGS Brain: No acute infarct or hemorrhage. Mild chronic small-vessel disease. No hydrocephalus or extra-axial collection. No foci of abnormal susceptibility. No mass or abnormal enhancement. Vascular: Normal flow voids. Intracranial atherosclerosis is better evaluated on previous day's head CTA. Skull and upper cervical spine: Normal marrow signal and enhancement. Other: None. MRI ORBITS FINDINGS Orbits: No traumatic or inflammatory finding. Globes, optic nerves, orbital fat, extraocular muscles, vascular structures, and lacrimal glands are normal. Visualized sinuses: Moderate mucosal disease in the right maxillary sinus and bilateral anterior ethmoid air cells. Soft tissues: Unremarkable. IMPRESSION: 1. No acute intracranial abnormality or mass. 2. Mild chronic small-vessel disease. 3. Unremarkable MRI of the orbits. 4. Moderate right maxillary sinus disease. Electronically Signed   By: Orvan Falconer M.D.   On: 03/15/2023 12:41   CT ANGIO HEAD NECK W WO CM  Result Date: 03/14/2023 CLINICAL DATA:  Transient ischemic attack (TIA) intermittent diplopia EXAM: CT ANGIOGRAPHY HEAD AND NECK WITH AND WITHOUT CONTRAST TECHNIQUE: Multidetector CT imaging of the head and neck was performed using the standard protocol during bolus administration of intravenous contrast. Multiplanar CT image  reconstructions and MIPs were obtained to evaluate the vascular anatomy. Carotid stenosis measurements (when applicable) are obtained utilizing NASCET criteria, using the distal internal carotid diameter as the denominator. RADIATION DOSE REDUCTION: This exam was performed according to the departmental dose-optimization program which includes automated exposure control, adjustment of the mA and/or kV according to patient size and/or use of iterative reconstruction technique. CONTRAST:  80mL OMNIPAQUE IOHEXOL 350 MG/ML SOLN COMPARISON:  None Available. FINDINGS: CT HEAD FINDINGS Brain: No evidence of acute infarction, hemorrhage, hydrocephalus, extra-axial collection or mass lesion/mass effect. Vascular: See below. Skull: No acute fracture. Sinuses/Orbits: Moderate right maxillary sinus mucosal thickening and mild scattered ethmoid air cell mucosal thickening. No acute  orbital findings. Other: No mastoid effusions. Review of the MIP images confirms the above findings CTA NECK FINDINGS Aortic arch: Great vessel origins are patent. Right carotid system: Atherosclerosis at the carotid bifurcation involving the proximal ICA without greater than 50% stenosis relative to the distal vessel. Left carotid system: Carotid bifurcation atherosclerosis without greater than 50% stenosis. Vertebral arteries: Severe stenosis of the bilateral vertebral artery origins due to calcific atherosclerosis. Otherwise, vertebral arteries are patent without significant stenosis. Skeleton: No acute abnormality on limited assessment. Other neck: No acute abnormality on limited assessment. Upper chest: Visualized lung apices are clear. Review of the MIP images confirms the above findings CTA HEAD FINDINGS Anterior circulation: Bilateral intracranial ICAs are patent with moderate narrowing bilaterally due to calcific atherosclerosis. Bilateral MCAs and bilateral ACAs are patent without proximal hemodynamically significant stenosis. Posterior  circulation: Severe stenosis of the proximal left intradural vertebral artery. Right intradural vertebral artery, basilar artery and both posterior cerebral arteries are patent. Moderate left and mild right P2 PCA stenosis. Venous sinuses: As permitted by contrast timing, patent. Review of the MIP images confirms the above findings IMPRESSION: 1. No emergent large vessel occlusion. 2. Severe left intradural vertebral artery stenosis. 3. Severe bilateral vertebral artery origin stenosis. 4. Moderate bilateral intracranial ICA stenosis. 5. Moderate left and mild right P2 PCA stenosis. Electronically Signed   By: Feliberto Harts M.D.   On: 03/14/2023 17:55    Labs:  CBC: Recent Labs    11/05/22 1209 03/14/23 1415 03/15/23 0001  WBC 11.1* 10.6* 9.6  HGB 15.1 15.5 14.9  HCT 42.9 45.1 44.1  PLT 245 256 225    COAGS: No results for input(s): "INR", "APTT" in the last 8760 hours.  BMP: Recent Labs    11/05/22 1209 03/14/23 1415 03/15/23 0001  NA 137 135 136  K 3.6 3.7 3.7  CL 103 103 105  CO2 24 20* 23  GLUCOSE 216* 324* 187*  BUN 14 9 11   CALCIUM 8.8* 9.3 8.7*  CREATININE 0.92 0.98 0.99  GFRNONAA >60 >60 >60    LIVER FUNCTION TESTS: Recent Labs    11/05/22 1209 03/15/23 0001  BILITOT 0.8 0.5  AST 24 17  ALT 17 17  ALKPHOS 105 97  PROT 6.5 6.4*  ALBUMIN 3.9 3.6    TUMOR MARKERS: No results for input(s): "AFPTM", "CEA", "CA199", "CHROMGRNA" in the last 8760 hours.  Assessment and Plan:  Dizziness and diplopia.  Severe left intradural vertebral artery stenosis. Severe bilateral vertebral artery origin stenosis. Moderate bilateral intracranial ICA stenosis. Moderate left and mild right P2 PCA stenosis.  Patient seen/evaluated and images/chart reviewed by Dr. Corliss Skains.  Will plan for cerebral angiography tomorrow as IR schedule allows.  Risks and benefits of cerebral angiogram with intervention were discussed with the patient including, but not limited to  bleeding, infection, vascular injury, contrast induced renal failure, stroke or even death.  This interventional procedure involves the use of X-rays and because of the nature of the planned procedure, it is possible that we will have prolonged use of X-ray fluoroscopy.  Potential radiation risks to you include (but are not limited to) the following: - A slightly elevated risk for cancer  several years later in life. This risk is typically less than 0.5% percent. This risk is low in comparison to the normal incidence of human cancer, which is 33% for women and 50% for men according to the American Cancer Society. - Radiation induced injury can include skin redness, resembling a rash, tissue breakdown /  ulcers and hair loss (which can be temporary or permanent).   The likelihood of either of these occurring depends on the difficulty of the procedure and whether you are sensitive to radiation due to previous procedures, disease, or genetic conditions.   IF your procedure requires a prolonged use of radiation, you will be notified and given written instructions for further action.  It is your responsibility to monitor the irradiated area for the 2 weeks following the procedure and to notify your physician if you are concerned that you have suffered a radiation induced injury.    All of the patient's questions were answered, patient is agreeable to proceed.  Consent signed and in IR.  Thank you for allowing our service to participate in CANTRELL LAROUCHE 's care.  Electronically Signed: Gwynneth Macleod, PA-C   03/15/2023, 3:27 PM      I spent a total of 40 Minutes  in face to face in clinical consultation, greater than 50% of which was counseling/coordinating care for cerebral angiography.

## 2023-03-15 NOTE — Hospital Course (Addendum)
66yom w/ T2DM,HTN,chronic smoker presented to ED w/ complaining of intermittent double vision,occurring approximate daily for past 1 week however happened 2-3 times on 7/9,lasts about 3 to 5 minutes He has not been taking any medication at all as he has financial problem and cannot afford any medication. Seen in UC then was referred to [facility] ED where he was diagnosed with TIA and encouraged to be admitted but he left due to runs of errands and returned to [facility] ED later  in the night around 8 PM for evaluation. In ED:  VS:176/104, heart rate 100, respiratory 17 and O2 sat 96% on room air.  Labs: CMP sodium 135, potassium 3.7, chloride 103, low bicarb 20, blood glucose 134 otherwise unremarkable. CBC slightly elevated WBC count 10.6. Elevated troponin 39 and 40, EKG showed sinus rhythm without any ST T wave abnormality and he was chest pain-free. CT angio head>>obtained which showed no emergent large vessel occlusion.  Severe left intradural vertebral artery stenosis, severe bilateral vertebral artery stenosis, moderate bilateral intracranial ICA stenosis, moderate left and mild P2 PCA stenosis. Patient was admitted for TIA w/ b/l vertebral artery stenosis and monitored intracranial ICA stenosis-MRI brain had no acute finding. Vascular surgery Dr. Juanetta Gosling was discussed advised neuro IR evaluation> who saw the patient for severe left intradural vertebral artery stenosis, severe bilateral vertebral artery origin stenosis and other intracranial ICA stenosis and left and right P2 PCA stenosis and decided for cerebral angiography 7/1 taht shows "Severe 90% stenosis at the origin of the dominant left vertebral artery. 2.  Approximately 50 to 60% stenosis of the left vertebral basilar junction just proximal to the left posterior inferior cerebellar artery." Discussed with neurology> after extensive discussion the patient no further intervention recommended, advised to discharge on aspirin Plavix  Lipitor follow-up with neurology and PCP as outpatient once echo is resulted.

## 2023-03-15 NOTE — Progress Notes (Signed)
SLP Cancellation Note  Patient Details Name: Devin Ellison MRN: [id number] DOB: [birth year]   Cancelled evaluation: Screened chart and spoke with nurse. Speech/language evaluation not warranted. SLP will sign off.  Devin Sayre L. Samson Frederic, MA CCC/SLP Clinical Specialist - Acute Care SLP Acute Rehabilitation Services Office number [phone]                                                                                                         Devin Ellison 03/15/2023, 1:04 PM

## 2023-03-15 NOTE — TOC Benefit Eligibility Note (Signed)
Pharmacy Patient Advocate Encounter  Insurance verification completed.    The patient is insured through [employer]/ [employer]   Ran test claim for Jardiance 10 mg and the current 30 day co-pay is $47.00.  Ran test claim for Semglee Pen and not on Formulary   This test claim was processed through [employer]- copay amounts may vary at other pharmacies due to [employer], or as the patient moves through the different stages of their insurance plan.    Roland Earl, CPHT Pharmacy Patient Advocate Specialist [facility] Patient Advocate Team Direct Number: [phone]  Fax: [phone]

## 2023-03-15 NOTE — Consult Note (Addendum)
[facility] Psychiatry Consult Evaluation  Reason for Consult: Psych Consult  Referring Physician:  Tereasa Coop, MD  Patient Identification: Devin Ellison MRN:  [id number] Chief Complaint: Bipolar 1 disorder, mixed, partial remission (HCC)  Diagnosis:  Bipolar 1 disorder, mixed, partial remission [facility])   ED Assessment Time Calculation: Start Time: 1400 Stop Time: 1449 Total Time in Minutes (Assessment Completion): 49   Subjective:    Devin Ellison is a 67 y.o. caucasian male with a past psychiatric history of bipolar 1 disorder most recent episode manic, GAD, MDD, and pertinent medical comorbidities that include hypertension, diabetes, vertebral arterial stenosis, hyperlipidemia, GERD, obstructive sleep apnea, and recurrent colon polyps, who presented this encounter to the hospital due to a chief complaint of diplopia and TIA on 07.09.2024. Psychiatry was consulted for concerns for depression and noncompliance with psychiatric medications by Dr. Janalyn Shy.   HPI:    Patient seen face-to-face for evaluation in the [facility] emergency department. Upon evaluation, patient presents with somewhat atypical and elevated interpersonal style with appreciable irritability, variably increased but notably not pressured speech, and expressions of increased psychosocial stress and depression due to financial stressors, incidents that have transpired in his life, and medical complications he is currently facing and being admitted for.    He endorses that due to the aforementioned, he is really struggling with irritability and depressed mood, endorses that he has not been sleeping well, but that he is notably still able to sleep. Patient endorses that he has not taken his previous medications of Depakote, Lamictal, and lurasidone in over a year, states to this writer that "they just do not do anything for me".  Patient verbalizes that because of the stressors that he has been facing he has for some time  now had chronic passive suicidal thoughts of wanting to die, but denies active plan, intent, means, or desire to end his life, states to this writer "I could just never ever do it," also denies any access to firearms or weapons. Patient orientation upon assessment is intact.  Patient does not present with any paranoid ideations, auditory visual hallucinations, ideas of reference, and/or overt delusional themes, though notably tells this Clinical research associate the story he has told on previous occasions regarding a woman breaking into his home and causing him to be placed in jail for 17 days earlier this year.  Patient denies history of suicide attempts, and/or self injures behavior past or present.  Patient endorses that he has been hospitalized 3 times in the past for mania, endorses once in [facility], once at [facility], and another time in [place].  Patient endorses that he does not use drugs anymore, and/or EtOH, however, he continues to smoke as much tobacco as he can, states, "it is my right, and I like it".  Patient given strong encouragement that his difficulties lately with sleep, increased irritability, and depressive mood are all clinical symptoms of a recrudescence of his bipolar 1 disorder manifesting, and that it was this provider's recommendation to restart his previous medications, but ultimately through shared decision-making, the patient stated he was not interested in restarting his medications and he declined.  Past Psychiatric History: Bipolar 1 disorder, GAD, MDD  Risk to Self or Others: Is the patient at risk to self? No Has the patient been a risk to self in the past 6 months? Yes Has the patient been a risk to self within the distant past? Yes Is the patient a risk to others? No Has the patient  been a risk to others in the past 6 months? No Has the patient been a risk to others within the distant past? No  [place] Scale:  Flowsheet Row ED to Hosp-Admission (Current) from  03/14/2023 in [facility] Most recent reading at 03/14/2023  8:14 PM ED from 03/14/2023 in [facility] Emergency Department at [facility] Most recent reading at 03/14/2023  2:08 PM ED from 11/05/2022 in [facility] Most recent reading at 11/05/2022  2:16 PM  C-SSRS RISK CATEGORY No Risk No Risk No Risk       Substance Abuse: Cocaine, cannabis  Past Medical History:  Past Medical History:  Diagnosis Date   Bipolar I disorder in remission    Last Assessment & Plan:  Patient voluntarily presents for evaluation citing increasingly reckless behavior over the past week, initially diagnosed with bipolar disorder 2014 has been managed on lamotrigine, Seroquel, stated he does not want to wake up in the morning but denies any plans -Evaluated by psychiatry in ED, IVC'd -Seroquel 300 mg   CKD (chronic kidney disease) 05/16/2021   Last Assessment & Plan: Renal fxn stable   Dermatitis of left ear canal 10/14/2018   Essential hypertension 03/13/2017   Gastroesophageal reflux disease without esophagitis 05/20/2021   Generalized anxiety disorder    Hardening of the aorta (main artery of the heart)    Hearing loss    Hyperlipidemia 05/20/2021   Major depressive disorder 04/30/2013   Obstructive sleep apnea    No CPAP use   Recurrent colon polyp s/p robotic colectomy 04/11/2016   Type II diabetes mellitus, noninsulin dependent 05/16/2021   Last Assessment & Plan: Restart on metformin at 500mg  bid, titrate -stable    Past Surgical History:  Procedure Laterality Date   ANKLE SURGERY Right    COLON SURGERY     polypectomy   HAND SURGERY Right    after fx   WISDOM TOOTH EXTRACTION     Family History:  Family History  Problem Relation Age of Onset   Heart disease Mother    Cerebral palsy Sister 106   Leukemia Sister    Breast cancer Sister    Depression Paternal Uncle    Bipolar disorder Paternal Uncle    Suicidality Paternal Aunt    Family  Psychiatric  History: None endorsed Social History:  Social History   Substance and Sexual Activity  Alcohol Use Not Currently   Comment: 6 beers/week     Social History   Substance and Sexual Activity  Drug Use No    Social History   Socioeconomic History   Marital status: Legally Separated    Spouse name: Not on file   Number of children: 2   Years of education: 19   Highest education level: Professional school degree (e.g., MD, DDS, DVM, JD)  Occupational History   Occupation: Unemployed    Comment: Former attorney  Tobacco Use   Smoking status: Every Day    Packs/day: 1    Types: Cigarettes    Last attempt to quit: 08/13/2013    Years since quitting: 9.5   Smokeless tobacco: Never  Vaping Use   Vaping Use: Never used  Substance and Sexual Activity   Alcohol use: Not Currently    Comment: 6 beers/week   Drug use: No   Sexual activity: Not on file  Other Topics Concern   Not on file  Social History Narrative   Not on file   Social Determinants of Health  Financial Resource Strain: Not on file  Food Insecurity: No Food Insecurity (03/15/2023)   Hunger Vital Sign    Worried About Running Out of Food in the Last Year: Never true    Ran Out of Food in the Last Year: Never true  Transportation Needs: No Transportation Needs (03/15/2023)   PRAPARE - Administrator, Civil Service (Medical): No    Lack of Transportation (Non-Medical): No  Physical Activity: Not on file  Stress: Not on file  Social Connections: Not on file   Additional Social History:    Allergies:  No Known Allergies  Labs:  Results for orders placed or performed during the hospital encounter of 03/14/23 (from the past 48 hour(s))  Troponin I (High Sensitivity)     Status: Abnormal   Collection Time: 03/14/23 11:00 PM  Result Value Ref Range   Troponin I (High Sensitivity) 59 (H) <18 ng/L    Comment: (NOTE) Elevated high sensitivity troponin I (hsTnI) values and significant   changes across serial measurements may suggest ACS but many other  chronic and acute conditions are known to elevate hsTnI results.  Refer to the "Links" section for chest pain algorithms and additional  guidance. Performed at [facility], [address] [place], [place] [address]   Lipid panel     Status: Abnormal   Collection Time: 03/14/23 11:00 PM  Result Value Ref Range   Cholesterol 193 0 - 200 mg/dL   Triglycerides 604 (H) <150 mg/dL   HDL 40 (L) >54 mg/dL   Total CHOL/HDL Ratio 4.8 RATIO   VLDL 56 (H) 0 - 40 mg/dL   LDL Cholesterol 97 0 - 99 mg/dL    Comment:        Total Cholesterol/HDL:CHD Risk Coronary Heart Disease Risk Table                     Men   Women  1/2 Average Risk   3.4   3.3  Average Risk       5.0   4.4  2 X Average Risk   9.6   7.1  3 X Average Risk  23.4   11.0        Use the calculated Patient Ratio above and the CHD Risk Table to determine the patient's CHD Risk.        ATP III CLASSIFICATION (LDL):  <100     mg/dL   Optimal  098-119  mg/dL   Near or Above                    Optimal  130-159  mg/dL   Borderline  147-829  mg/dL   High  >562     mg/dL   Very High Performed at [facility], [address], [place], [place] [address]   Hemoglobin A1c     Status: Abnormal   Collection Time: 03/14/23 11:00 PM  Result Value Ref Range   Hgb A1c MFr Bld 9.0 (H) 4.8 - 5.6 %    Comment: (NOTE) Pre diabetes:          5.7%-6.4%  Diabetes:              >6.4%  Glycemic control for   <7.0% adults with diabetes    Mean Plasma Glucose 211.6 mg/dL    Comment: Performed at [facility], [address], [place], [place] [address]  Comprehensive metabolic panel     Status:  Abnormal   Collection Time: 03/15/23 12:01 AM  Result Value Ref Range   Sodium 136 135 - 145 mmol/L   Potassium 3.7 3.5 - 5.1 mmol/L   Chloride 105 98 - 111 mmol/L   CO2 23 22 - 32 mmol/L   Glucose, Bld 187 (H) 70 - 99 mg/dL    Comment: Glucose reference range  applies only to samples taken after fasting for at least 8 hours.   BUN 11 8 - 23 mg/dL   Creatinine, Ser 1.61 0.61 - 1.24 mg/dL   Calcium 8.7 (L) 8.9 - 10.3 mg/dL   Total Protein 6.4 (L) 6.5 - 8.1 g/dL   Albumin 3.6 3.5 - 5.0 g/dL   AST 17 15 - 41 U/L   ALT 17 0 - 44 U/L   Alkaline Phosphatase 97 38 - 126 U/L   Total Bilirubin 0.5 0.3 - 1.2 mg/dL   GFR, Estimated >09 >60 mL/min    Comment: (NOTE) Calculated using the CKD-EPI Creatinine Equation (2021)    Anion gap 8 5 - 15    Comment: Performed at [facility], [address], [place], [place] [address]  CBC     Status: None   Collection Time: 03/15/23 12:01 AM  Result Value Ref Range   WBC 9.6 4.0 - 10.5 K/uL   RBC 4.85 4.22 - 5.81 MIL/uL   Hemoglobin 14.9 13.0 - 17.0 g/dL   HCT 81.1 91.4 - 78.2 %   MCV 90.9 80.0 - 100.0 fL   MCH 30.7 26.0 - 34.0 pg   MCHC 33.8 30.0 - 36.0 g/dL   RDW 95.6 21.3 - 08.6 %   Platelets 225 150 - 400 K/uL   nRBC 0.0 0.0 - 0.2 %    Comment: Performed at [facility], [address] [place], [place] [address]  HIV Antibody (routine testing w rflx)     Status: None   Collection Time: 03/15/23 12:01 AM  Result Value Ref Range   HIV Screen 4th Generation wRfx Non Reactive Non Reactive    Comment: Performed at [facility], [address], [place], [place] [address]  Troponin I (High Sensitivity)     Status: Abnormal   Collection Time: 03/15/23  1:12 AM  Result Value Ref Range   Troponin I (High Sensitivity) 56 (H) <18 ng/L    Comment: (NOTE) Elevated high sensitivity troponin I (hsTnI) values and significant  changes across serial measurements may suggest ACS but many other  chronic and acute conditions are known to elevate hsTnI results.  Refer to the "Links" section for chest pain algorithms and additional  guidance. Performed at [facility], [address], [place], [place] [address]   CBG monitoring, ED     Status: Abnormal   Collection Time: 03/15/23  4:00 AM   Result Value Ref Range   Glucose-Capillary 174 (H) 70 - 99 mg/dL    Comment: Glucose reference range applies only to samples taken after fasting for at least 8 hours.  CBG monitoring, ED     Status: Abnormal   Collection Time: 03/15/23  8:39 AM  Result Value Ref Range   Glucose-Capillary 233 (H) 70 - 99 mg/dL    Comment: Glucose reference range applies only to samples taken after fasting for at least 8 hours.  CBG monitoring, ED     Status: Abnormal   Collection Time: 03/15/23 12:49 PM  Result Value Ref Range   Glucose-Capillary 135 (H) 70 - 99 mg/dL  Comment: Glucose reference range applies only to samples taken after fasting for at least 8 hours.    Current Facility-Administered Medications  Medication Dose Route Frequency Provider Last Rate Last Admin    stroke: early stages of recovery book   Does not apply Once Sundil, Subrina, MD       acetaminophen (TYLENOL) tablet 650 mg  650 mg Oral Q4H PRN Janalyn Shy, Subrina, MD       Or   acetaminophen (TYLENOL) 160 MG/5ML solution 650 mg  650 mg Per Tube Q4H PRN Janalyn Shy, Subrina, MD       Or   acetaminophen (TYLENOL) suppository 650 mg  650 mg Rectal Q4H PRN Janalyn Shy, Subrina, MD       aspirin EC tablet 81 mg  81 mg Oral Daily Sundil, Subrina, MD   81 mg at 03/15/23 0943   atorvastatin (LIPITOR) tablet 40 mg  40 mg Oral Daily Sundil, Subrina, MD   40 mg at 03/15/23 1610   empagliflozin (JARDIANCE) tablet 10 mg  10 mg Oral Daily Sundil, Subrina, MD   10 mg at 03/15/23 0943   enoxaparin (LOVENOX) injection 40 mg  40 mg Subcutaneous Q24H Sundil, Subrina, MD   40 mg at 03/14/23 2355   hydrALAZINE (APRESOLINE) injection 10 mg  10 mg Intravenous Q6H PRN Sundil, Subrina, MD       insulin aspart (novoLOG) injection 0-6 Units  0-6 Units Subcutaneous TID WC Sundil, Subrina, MD   2 Units at 03/15/23 0842   losartan (COZAAR) tablet 50 mg  50 mg Oral Daily Sundil, Subrina, MD   50 mg at 03/15/23 0944   ondansetron (ZOFRAN) injection 4 mg  4 mg Intravenous  Q6H PRN Sundil, Subrina, MD       senna-docusate (Senokot-S) tablet 1 tablet  1 tablet Oral QHS PRN Tereasa Coop, MD        Musculoskeletal: Strength & Muscle Tone: increased Gait & Station: normal Patient leans: N/A   Psychiatric Specialty Exam: Presentation  General Appearance:  Appropriate for Environment (Mildly elevated interpersonal style with irritable and dysphoric edge)  Eye Contact: Other (comment) (Variable to intense)  Speech: Clear and Coherent; Normal Rate; Other (comment) (intermittently increased amount, but notably not pressured, and is interruptable)  Speech Volume: Other (comment) (Variable)  Handedness: Right   Mood and Affect  Mood: Dysphoric  Affect: Congruent   Thought Process  Thought Processes: Linear; Goal Directed; Coherent  Descriptions of Associations:Circumstantial  Orientation:Full (Time, Place and Person)  Thought Content:Logical; Delusions  History of Schizophrenia/Schizoaffective disorder:No  Duration of Psychotic Symptoms:N/A  Hallucinations:Hallucinations: None  Ideas of Reference:None  Suicidal Thoughts:Suicidal Thoughts: Yes, Passive SI Passive Intent and/or Plan: Without Intent; Without Plan; Without Means to Carry Out; Without Access to Means  Homicidal Thoughts:Homicidal Thoughts: No   Sensorium  Memory: Immediate Fair; Recent Fair; Remote Fair  Judgment: Intact  Insight: Poor   Executive Functions  Concentration: Fair  Attention Span: Fair  Recall: [employer] of Knowledge: Fair  Language: Fair   Psychomotor Activity  Psychomotor Activity: Psychomotor Activity: Increased   Assets  Assets: Communication Skills; Resilience; Vocational/Educational; Financial Resources/Insurance; Housing; Physical Health; Leisure Time; Talents/Skills    Sleep  Sleep: Sleep: Poor   Physical Exam: Physical Exam Vitals and nursing note reviewed.  Constitutional:      General: He is not in  acute distress.    Appearance: Normal appearance. He is not ill-appearing, toxic-appearing or diaphoretic.  Pulmonary:     Effort: Pulmonary effort is normal.  Neurological:  Mental Status: He is alert and oriented to person, place, and time.  Psychiatric:        Attention and Perception: Attention and perception normal. He is attentive. He does not perceive auditory or visual hallucinations.        Mood and Affect: Affect is angry.        Behavior: Behavior is agitated. Behavior is cooperative.        Thought Content: Thought content is not paranoid or delusional. Thought content includes suicidal (Reports passive suicidal ideations) ideation. Thought content does not include homicidal ideation. Thought content does not include homicidal or suicidal plan.        Cognition and Memory: Cognition and memory normal.    Review of Systems  Psychiatric/Behavioral:  Positive for depression and suicidal ideas. Negative for hallucinations and substance abuse. The patient has insomnia. The patient is not nervous/anxious.   All other systems reviewed and are negative.  Blood pressure (!) 143/92, pulse (!) 52, temperature 98.2 F (36.8 C), temperature source Oral, resp. rate 15, SpO2 98 %. There is no height or weight on file to calculate BMI.  Medical Decision Making:  Diagnostically, the patient presents with symptomology that is consistent with the patient's historical and current diagnosis of bipolar 1 disorder. The patient currently appears to have a recrudescence of increased irritability, depressed mood, difficulties with sleep, and passive suicidal ideations in the context of increased psychosocial stressors, however, he does not present with any imminent risk to himself or others and has capacity to make his own decisions in regards to his plan of care at this time. Discussed with the patient extensively restarting previous medications that have been helpful to control his bipolar affective  disorder, however, he unfortunately states he is not interested at this time.   Recommendations  Bipolar 1 disorder, mixed, partial remission (HCC)   -If/when patient is amenable, recommend restarting Depakote ER 2000 mg PO daily -If/when patient is amenable to Depakote, recommend valproate level drawn in 4 to 5 days from start to evaluate therapeutic level -If/when patient is amenable, recommend restart Lamictal at 25 mg once daily with subsequent titration to 100 mg twice daily -If/when patient is amenable, recommend restart lurasidone 20 mg nightly -Recommend reconsulting psychiatry if additional needs become appreciable and/or the patient further decompensates and there are concerns by primary team   Disposition: No evidence of imminent risk to self or others at present.   Patient does not meet criteria for psychiatric inpatient admission. Supportive therapy provided about ongoing stressors. Discussed crisis plan, support from social network, calling 911, coming to the Emergency Department, and calling Suicide Hotline.  Lenox Ponds, NP 03/15/2023 3:09 PM

## 2023-03-15 NOTE — Inpatient Diabetes Management (Signed)
Inpatient Diabetes Program Recommendations  AACE/ADA: New Consensus Statement on Inpatient Glycemic Control (2015)  Target Ranges:  Prepandial:   less than 140 mg/dL      Peak postprandial:   less than 180 mg/dL (1-2 hours)      Critically ill patients:  140 - 180 mg/dL   Lab Results  Component Value Date   GLUCAP 233 (H) 03/15/2023   HGBA1C 9.0 (H) 03/14/2023    Review of Glycemic Control  Latest Reference Range & Units 03/15/23 04:00 03/15/23 08:39  Glucose-Capillary 70 - 99 mg/dL 161 (H) 096 (H)   Diabetes history: DM  Outpatient Diabetes medications:  Jardiance 10 mg daily Metformin 1000 mg bid Current orders for Inpatient glycemic control:  Novolog 0-6 units tid with meals  Jardiance 10 mg daily  Inpatient Diabetes Program Recommendations:    Note elevated A1C and CBG's>hospital goals.    Recommend adding Semglee 10 units daily while hospitalized.  Will follow.   Thanks,  Beryl Meager, RN, BC-ADM Inpatient Diabetes Coordinator Pager [phone]

## 2023-03-15 NOTE — Plan of Care (Signed)
  Problem: Education: Goal: Knowledge of disease or condition will improve Outcome: Progressing   Problem: Ischemic Stroke/TIA Tissue Perfusion: Goal: Complications of ischemic stroke/TIA will be minimized Outcome: Progressing   Problem: Health Behavior/Discharge Planning: Goal: Ability to manage health-related needs will improve Outcome: Progressing   Problem: Self-Care: Goal: Ability to participate in self-care as condition permits will improve Outcome: Progressing   Problem: Nutrition: Goal: Risk of aspiration will decrease Outcome: Progressing Goal: Dietary intake will improve Outcome: Progressing   Problem: Fluid Volume: Goal: Ability to maintain a balanced intake and output will improve Outcome: Progressing   Problem: Skin Integrity: Goal: Risk for impaired skin integrity will decrease Outcome: Progressing

## 2023-03-15 NOTE — ED Notes (Signed)
ED TO INPATIENT HANDOFF REPORT  ED Nurse Name and Phone #:  Kholton Coate [phone]  S Name/Age/Gender Devin Ellison 67 y.o. male Room/Bed: 046C/046C  Code Status   Code Status: Full Code  Home/SNF/Other Home Patient oriented to: self, place, time, and situation Is this baseline? Yes   Triage Complete: Triage complete  Chief Complaint TIA (transient ischemic attack) [G45.9]  Triage Note  Patient here POV from Home.   Endorses Dizziness and Double Vision intermittently for a week. No CP or SOB. Dizziness is described as "Room-Spinning". Sent by [facility] for Abnormal ECG.    NAD Noted during Triage. A&Ox4. Gcs 15. Ambulatory.   Was sent here by [facility] for admission previous triage note as seen above from earlier today    Allergies No Known Allergies  Level of Care/Admitting Diagnosis ED Disposition     ED Disposition  Admit   Condition  --   Comment  Hospital Area: [facility] [[id number]]  Level of Care: Telemetry Medical [104]  May place patient in observation at [facility] or [facility] if equivalent level of care is available:: No  Covid Evaluation: Asymptomatic - no recent exposure (last 10 days) testing not required  Diagnosis: TIA (transient ischemic attack) [161096]  Admitting Physician: Tereasa Coop [[id number]]  Attending Physician: Tereasa Coop [[id number]]          B Medical/Surgery History Past Medical History:  Diagnosis Date   Bipolar I disorder in remission    Last Assessment & Plan:  Patient voluntarily presents for evaluation citing increasingly reckless behavior over the past week, initially diagnosed with bipolar disorder 2014 has been managed on lamotrigine, Seroquel, stated he does not want to wake up in the morning but denies any plans -Evaluated by psychiatry in ED, IVC'd -Seroquel 300 mg   CKD (chronic kidney disease) 05/16/2021   Last Assessment & Plan: Renal fxn stable   Dermatitis of left ear canal 10/14/2018    Essential hypertension 03/13/2017   Gastroesophageal reflux disease without esophagitis 05/20/2021   Generalized anxiety disorder    Hardening of the aorta (main artery of the heart)    Hearing loss    Hyperlipidemia 05/20/2021   Major depressive disorder 04/30/2013   Obstructive sleep apnea    No CPAP use   Recurrent colon polyp s/p robotic colectomy 04/11/2016   Type II diabetes mellitus, noninsulin dependent 05/16/2021   Last Assessment & Plan: Restart on metformin at 500mg  bid, titrate -stable   Past Surgical History:  Procedure Laterality Date   ANKLE SURGERY Right    COLON SURGERY     polypectomy   HAND SURGERY Right    after fx   WISDOM TOOTH EXTRACTION       A IV Location/Drains/Wounds Patient Lines/Drains/Airways Status     Active Line/Drains/Airways     Name Placement date Placement time Site Days   Incision - 4 Ports Abdomen Upper;Left Left Left;Lateral Left;Lower 12/01/21  1340  -- 469            Intake/Output Last 24 hours No intake or output data in the 24 hours ending 03/15/23 1348  Labs/Imaging Results for orders placed or performed during the hospital encounter of 03/14/23 (from the past 48 hour(s))  Troponin I (High Sensitivity)     Status: Abnormal   Collection Time: 03/14/23 11:00 PM  Result Value Ref Range   Troponin I (High Sensitivity) 59 (H) <18 ng/L    Comment: (NOTE) Elevated high sensitivity troponin I (hsTnI) values and  significant  changes across serial measurements may suggest ACS but many other  chronic and acute conditions are known to elevate hsTnI results.  Refer to the "Links" section for chest pain algorithms and additional  guidance. Performed at [facility], [address], [place], [place] [address]   Lipid panel     Status: Abnormal   Collection Time: 03/14/23 11:00 PM  Result Value Ref Range   Cholesterol 193 0 - 200 mg/dL   Triglycerides 604 (H) <150 mg/dL   HDL 40 (L) >54 mg/dL   Total CHOL/HDL Ratio 4.8  RATIO   VLDL 56 (H) 0 - 40 mg/dL   LDL Cholesterol 97 0 - 99 mg/dL    Comment:        Total Cholesterol/HDL:CHD Risk Coronary Heart Disease Risk Table                     Men   Women  1/2 Average Risk   3.4   3.3  Average Risk       5.0   4.4  2 X Average Risk   9.6   7.1  3 X Average Risk  23.4   11.0        Use the calculated Patient Ratio above and the CHD Risk Table to determine the patient's CHD Risk.        ATP III CLASSIFICATION (LDL):  <100     mg/dL   Optimal  098-119  mg/dL   Near or Above                    Optimal  130-159  mg/dL   Borderline  147-829  mg/dL   High  >562     mg/dL   Very High Performed at [facility], [address] [place], [place] [address]   Hemoglobin A1c     Status: Abnormal   Collection Time: 03/14/23 11:00 PM  Result Value Ref Range   Hgb A1c MFr Bld 9.0 (H) 4.8 - 5.6 %    Comment: (NOTE) Pre diabetes:          5.7%-6.4%  Diabetes:              >6.4%  Glycemic control for   <7.0% adults with diabetes    Mean Plasma Glucose 211.6 mg/dL    Comment: Performed at [facility], [address], [place], [place] [address]  Comprehensive metabolic panel     Status: Abnormal   Collection Time: 03/15/23 12:01 AM  Result Value Ref Range   Sodium 136 135 - 145 mmol/L   Potassium 3.7 3.5 - 5.1 mmol/L   Chloride 105 98 - 111 mmol/L   CO2 23 22 - 32 mmol/L   Glucose, Bld 187 (H) 70 - 99 mg/dL    Comment: Glucose reference range applies only to samples taken after fasting for at least 8 hours.   BUN 11 8 - 23 mg/dL   Creatinine, Ser 9.62 0.61 - 1.24 mg/dL   Calcium 8.7 (L) 8.9 - 10.3 mg/dL   Total Protein 6.4 (L) 6.5 - 8.1 g/dL   Albumin 3.6 3.5 - 5.0 g/dL   AST 17 15 - 41 U/L   ALT 17 0 - 44 U/L   Alkaline Phosphatase 97 38 - 126 U/L   Total Bilirubin 0.5 0.3 - 1.2 mg/dL   GFR, Estimated >95 >28 mL/min    Comment: (NOTE) Calculated using the CKD-EPI Creatinine Equation (  2021)    Anion gap 8 5 - 15    Comment: Performed  at [facility], [address] [place], [place] [address]  CBC     Status: None   Collection Time: 03/15/23 12:01 AM  Result Value Ref Range   WBC 9.6 4.0 - 10.5 K/uL   RBC 4.85 4.22 - 5.81 MIL/uL   Hemoglobin 14.9 13.0 - 17.0 g/dL   HCT 60.4 54.0 - 98.1 %   MCV 90.9 80.0 - 100.0 fL   MCH 30.7 26.0 - 34.0 pg   MCHC 33.8 30.0 - 36.0 g/dL   RDW 19.1 47.8 - 29.5 %   Platelets 225 150 - 400 K/uL   nRBC 0.0 0.0 - 0.2 %    Comment: Performed at [facility], [address] [place], [place] [address]  HIV Antibody (routine testing w rflx)     Status: None   Collection Time: 03/15/23 12:01 AM  Result Value Ref Range   HIV Screen 4th Generation wRfx Non Reactive Non Reactive    Comment: Performed at [facility], [address] [place], [place] [address]  Troponin I (High Sensitivity)     Status: Abnormal   Collection Time: 03/15/23  1:12 AM  Result Value Ref Range   Troponin I (High Sensitivity) 56 (H) <18 ng/L    Comment: (NOTE) Elevated high sensitivity troponin I (hsTnI) values and significant  changes across serial measurements may suggest ACS but many other  chronic and acute conditions are known to elevate hsTnI results.  Refer to the "Links" section for chest pain algorithms and additional  guidance. Performed at [facility], [address], [place], [place] [address]   CBG monitoring, ED     Status: Abnormal   Collection Time: 03/15/23  4:00 AM  Result Value Ref Range   Glucose-Capillary 174 (H) 70 - 99 mg/dL    Comment: Glucose reference range applies only to samples taken after fasting for at least 8 hours.  CBG monitoring, ED     Status: Abnormal   Collection Time: 03/15/23  8:39 AM  Result Value Ref Range   Glucose-Capillary 233 (H) 70 - 99 mg/dL    Comment: Glucose reference range applies only to samples taken after fasting for at least 8 hours.  CBG monitoring, ED     Status: Abnormal   Collection Time: 03/15/23 12:49 PM  Result Value  Ref Range   Glucose-Capillary 135 (H) 70 - 99 mg/dL    Comment: Glucose reference range applies only to samples taken after fasting for at least 8 hours.   MR BRAIN W WO CONTRAST  Result Date: 03/15/2023 CLINICAL DATA:  Ophthalmoplegia. EXAM: MRI HEAD AND ORBITS WITHOUT AND WITH CONTRAST TECHNIQUE: Multiplanar, multiecho pulse sequences of the brain and surrounding structures were obtained without and with intravenous contrast. Multiplanar, multiecho pulse sequences of the orbits and surrounding structures were obtained including fat saturation techniques, before and after intravenous contrast administration. CONTRAST:  10mL GADAVIST GADOBUTROL 1 MMOL/ML IV SOLN COMPARISON:  CTA head/neck 03/14/2023. FINDINGS: MRI HEAD FINDINGS Brain: No acute infarct or hemorrhage. Mild chronic small-vessel disease. No hydrocephalus or extra-axial collection. No foci of abnormal susceptibility. No mass or abnormal enhancement. Vascular: Normal flow voids. Intracranial atherosclerosis is better evaluated on previous day's head CTA. Skull and upper cervical spine: Normal marrow signal and enhancement. Other: None. MRI ORBITS FINDINGS Orbits: No traumatic or inflammatory finding. Globes, optic nerves, orbital fat, extraocular muscles, vascular structures,  and lacrimal glands are normal. Visualized sinuses: Moderate mucosal disease in the right maxillary sinus and bilateral anterior ethmoid air cells. Soft tissues: Unremarkable. IMPRESSION: 1. No acute intracranial abnormality or mass. 2. Mild chronic small-vessel disease. 3. Unremarkable MRI of the orbits. 4. Moderate right maxillary sinus disease. Electronically Signed   By: Orvan Falconer M.D.   On: 03/15/2023 12:41   MR ORBITS W WO CONTRAST  Result Date: 03/15/2023 CLINICAL DATA:  Ophthalmoplegia. EXAM: MRI HEAD AND ORBITS WITHOUT AND WITH CONTRAST TECHNIQUE: Multiplanar, multiecho pulse sequences of the brain and surrounding structures were obtained without and with  intravenous contrast. Multiplanar, multiecho pulse sequences of the orbits and surrounding structures were obtained including fat saturation techniques, before and after intravenous contrast administration. CONTRAST:  10mL GADAVIST GADOBUTROL 1 MMOL/ML IV SOLN COMPARISON:  CTA head/neck 03/14/2023. FINDINGS: MRI HEAD FINDINGS Brain: No acute infarct or hemorrhage. Mild chronic small-vessel disease. No hydrocephalus or extra-axial collection. No foci of abnormal susceptibility. No mass or abnormal enhancement. Vascular: Normal flow voids. Intracranial atherosclerosis is better evaluated on previous day's head CTA. Skull and upper cervical spine: Normal marrow signal and enhancement. Other: None. MRI ORBITS FINDINGS Orbits: No traumatic or inflammatory finding. Globes, optic nerves, orbital fat, extraocular muscles, vascular structures, and lacrimal glands are normal. Visualized sinuses: Moderate mucosal disease in the right maxillary sinus and bilateral anterior ethmoid air cells. Soft tissues: Unremarkable. IMPRESSION: 1. No acute intracranial abnormality or mass. 2. Mild chronic small-vessel disease. 3. Unremarkable MRI of the orbits. 4. Moderate right maxillary sinus disease. Electronically Signed   By: Orvan Falconer M.D.   On: 03/15/2023 12:41   CT ANGIO HEAD NECK W WO CM  Result Date: 03/14/2023 CLINICAL DATA:  Transient ischemic attack (TIA) intermittent diplopia EXAM: CT ANGIOGRAPHY HEAD AND NECK WITH AND WITHOUT CONTRAST TECHNIQUE: Multidetector CT imaging of the head and neck was performed using the standard protocol during bolus administration of intravenous contrast. Multiplanar CT image reconstructions and MIPs were obtained to evaluate the vascular anatomy. Carotid stenosis measurements (when applicable) are obtained utilizing NASCET criteria, using the distal internal carotid diameter as the denominator. RADIATION DOSE REDUCTION: This exam was performed according to the departmental  dose-optimization program which includes automated exposure control, adjustment of the mA and/or kV according to patient size and/or use of iterative reconstruction technique. CONTRAST:  80mL OMNIPAQUE IOHEXOL 350 MG/ML SOLN COMPARISON:  None Available. FINDINGS: CT HEAD FINDINGS Brain: No evidence of acute infarction, hemorrhage, hydrocephalus, extra-axial collection or mass lesion/mass effect. Vascular: See below. Skull: No acute fracture. Sinuses/Orbits: Moderate right maxillary sinus mucosal thickening and mild scattered ethmoid air cell mucosal thickening. No acute orbital findings. Other: No mastoid effusions. Review of the MIP images confirms the above findings CTA NECK FINDINGS Aortic arch: Great vessel origins are patent. Right carotid system: Atherosclerosis at the carotid bifurcation involving the proximal ICA without greater than 50% stenosis relative to the distal vessel. Left carotid system: Carotid bifurcation atherosclerosis without greater than 50% stenosis. Vertebral arteries: Severe stenosis of the bilateral vertebral artery origins due to calcific atherosclerosis. Otherwise, vertebral arteries are patent without significant stenosis. Skeleton: No acute abnormality on limited assessment. Other neck: No acute abnormality on limited assessment. Upper chest: Visualized lung apices are clear. Review of the MIP images confirms the above findings CTA HEAD FINDINGS Anterior circulation: Bilateral intracranial ICAs are patent with moderate narrowing bilaterally due to calcific atherosclerosis. Bilateral MCAs and bilateral ACAs are patent without proximal hemodynamically significant stenosis. Posterior circulation: Severe stenosis of the proximal  left intradural vertebral artery. Right intradural vertebral artery, basilar artery and both posterior cerebral arteries are patent. Moderate left and mild right P2 PCA stenosis. Venous sinuses: As permitted by contrast timing, patent. Review of the MIP images  confirms the above findings IMPRESSION: 1. No emergent large vessel occlusion. 2. Severe left intradural vertebral artery stenosis. 3. Severe bilateral vertebral artery origin stenosis. 4. Moderate bilateral intracranial ICA stenosis. 5. Moderate left and mild right P2 PCA stenosis. Electronically Signed   By: Feliberto Harts M.D.   On: 03/14/2023 17:55    Pending Labs Unresulted Labs (From admission, onward)    None       Vitals/Pain Today's Vitals   03/15/23 1000 03/15/23 1015 03/15/23 1030 03/15/23 1045  BP: (!) 157/92 (!) 150/96 (!) 161/88 (!) 141/101  Pulse:      Resp: 14 13 14 14   Temp:      TempSrc:      SpO2:      PainSc:        Isolation Precautions No active isolations  Medications Medications  losartan (COZAAR) tablet 50 mg (50 mg Oral Given 03/15/23 0944)   stroke: early stages of recovery book (has no administration in time range)  acetaminophen (TYLENOL) tablet 650 mg (has no administration in time range)    Or  acetaminophen (TYLENOL) 160 MG/5ML solution 650 mg (has no administration in time range)    Or  acetaminophen (TYLENOL) suppository 650 mg (has no administration in time range)  senna-docusate (Senokot-S) tablet 1 tablet (has no administration in time range)  enoxaparin (LOVENOX) injection 40 mg (40 mg Subcutaneous Given 03/14/23 2355)  aspirin EC tablet 81 mg (81 mg Oral Given 03/15/23 0943)  atorvastatin (LIPITOR) tablet 40 mg (40 mg Oral Given 03/15/23 0943)  ondansetron (ZOFRAN) injection 4 mg (has no administration in time range)  hydrALAZINE (APRESOLINE) injection 10 mg (has no administration in time range)  insulin aspart (novoLOG) injection 0-6 Units ( Subcutaneous Not Given 03/15/23 1252)  empagliflozin (JARDIANCE) tablet 10 mg (10 mg Oral Given 03/15/23 0943)  aspirin chewable tablet 324 mg (324 mg Oral Given 03/15/23 0002)  gadobutrol (GADAVIST) 1 MMOL/ML injection 10 mL (10 mLs Intravenous Contrast Given 03/15/23 1219)    Mobility walks      Focused Assessments Neuro Assessment Handoff:  Swallow screen pass? Yes  Cardiac Rhythm: Sinus bradycardia NIH Stroke Scale  Dizziness Present: No Headache Present: No Interval: Other (Comment) (every 4 hours) Level of Consciousness (1a.)   : Alert, keenly responsive LOC Questions (1b. )   : Answers both questions correctly LOC Commands (1c. )   : Performs both tasks correctly Best Gaze (2. )  : Normal Visual (3. )  : No visual loss Facial Palsy (4. )    : Normal symmetrical movements Motor Arm, Left (5a. )   : No drift Motor Arm, Right (5b. ) : No drift Motor Leg, Left (6a. )  : No drift Motor Leg, Right (6b. ) : No drift Limb Ataxia (7. ): Absent Sensory (8. )  : Normal, no sensory loss Best Language (9. )  : No aphasia Dysarthria (10. ): Normal Extinction/Inattention (11.)   : No Abnormality Complete NIHSS TOTAL: 0     Neuro Assessment: Within Defined Limits Neuro Checks:   Initial (03/15/23 0001)  Has TPA been given? No If patient is a Neuro Trauma and patient is going to OR before floor call report to 4N Charge nurse: [phone] or [phone]   R Recommendations: See  Admitting Provider Note  Report given to:   Additional Notes:

## 2023-03-15 NOTE — TOC Initial Note (Signed)
Transition of Care [facility]) - Initial/Assessment Note    Patient Details  Name: Devin Ellison MRN: [id number] Date of Birth: 05/15/1956  Transition of Care [facility]) CM/SW Contact:    Kermit Balo, RN Phone Number: 03/15/2023, 4:30 PM  Clinical Narrative:                 CM met with the patient and he is from home alone. He has sisters that live out of states and a fiance that is in [place]. He is not close with anyone local.  CM inquired about the cost of his medications. He states after bills he has $300 for food, meds, etc and he can not afford his medications. He states he also doesn't see the point. CM inquired if there are a few meds that are the most expensive we could see about patient assistance and he says there is no need to bother he isn't going to take them. Pt definitely showing signs of depression. No DME at home.  Pt drives self.  TOC following.  Expected Discharge Plan: Home/Self Care Barriers to Discharge: Continued Medical Work up   Patient Goals and CMS Choice            Expected Discharge Plan and Services   Discharge Planning Services: CM Consult   Living arrangements for the past 2 months: Apartment                                      Prior Living Arrangements/Services Living arrangements for the past 2 months: Apartment Lives with:: Self Patient language and need for interpreter reviewed:: Yes Do you feel safe going back to the place where you live?: Yes        Care giver support system in place?: No (comment)   Criminal Activity/Legal Involvement Pertinent to Current Situation/Hospitalization: No - Comment as needed  Activities of Daily Living Home Assistive Devices/Equipment: Blood pressure cuff ADL Screening (condition at time of admission) Patient's cognitive ability adequate to safely complete daily activities?: Yes Is the patient deaf or have difficulty hearing?: No Does the patient have difficulty seeing, even when wearing  glasses/contacts?: No Does the patient have difficulty concentrating, remembering, or making decisions?: No Patient able to express need for assistance with ADLs?: Yes Does the patient have difficulty dressing or bathing?: No Independently performs ADLs?: Yes (appropriate for developmental age) Does the patient have difficulty walking or climbing stairs?: No Weakness of Legs: None Weakness of Arms/Hands: None  Permission Sought/Granted                  Emotional Assessment Appearance:: Appears stated age Attitude/Demeanor/Rapport: Crying Affect (typically observed): Hopeless, Overwhelmed, Depressed Orientation: : Oriented to Self, Oriented to Place, Oriented to  Time, Oriented to Situation   Psych Involvement: Yes (comment)  Admission diagnosis:  TIA (transient ischemic attack) [G45.9] Patient Active Problem List   Diagnosis Date Noted   Bipolar 1 disorder, mixed, partial remission (HCC) 03/15/2023   TIA (transient ischemic attack) 03/14/2023   Elevated troponin 03/14/2023   Vertebral artery stenosis 03/14/2023   Obstructive sleep apnea    Bipolar I disorder in remission    Obesity    Generalized anxiety disorder    Hardening of the aorta (main artery of the heart)    Hearing loss    Gastroesophageal reflux disease without esophagitis 05/20/2021   Hyperlipidemia 05/20/2021   Tobacco use 05/20/2021  CKD (chronic kidney disease) 05/16/2021   Non-insulin dependent type 2 diabetes mellitus (HCC) 05/16/2021   Essential hypertension 03/13/2017   Recurrent colon polyp s/p robotic colectomy 04/11/2016   Major depressive disorder 04/30/2013   PCP:  Farris Has, MD Pharmacy:   [facility] [address] [place] - [address] [place] [address] Phone: [phone] Fax: [phone]  [facility] [address] [place] [address] Phone: [phone] Fax: [phone]     Social  Determinants of Health (SDOH) Social History: SDOH Screenings   Food Insecurity: No Food Insecurity (03/15/2023)  Housing: Low Risk  (03/15/2023)  Transportation Needs: No Transportation Needs (03/15/2023)  Utilities: Not At Risk (03/15/2023)  Depression (PHQ2-9): Medium Risk (06/24/2021)  Tobacco Use: High Risk (03/15/2023)   SDOH Interventions:     Readmission Risk Interventions     No data to display

## 2023-03-15 NOTE — Evaluation (Signed)
Physical Therapy Evaluation Patient Details Name: Devin Ellison MRN: [id number] DOB: November 03, 1955 Today's Date: 03/15/2023  History of Present Illness  Pt is a 67 yo male who has reports of double vision 2-3x a day for past day (less frequent in the last week) that lasts 3-5 minutes each time. Pt experiences dizziness during episodes and feels weak.  Pt found to have B vertebral stenosis, L intradural artery stenosis, B iCA stenosis and PCA stenosis.  PMH: DM2, HTN, previous smoker.  Clinical Impression  Pt presents to PT at or near his functional baseline. Pt is able to ambulate independently and tolerates multiple dynamic gait and balance challenges without significant deviation. PT performs vestibular assessment, pt is asymptomatic with testing and does not report a consistent pattern of symptoms with changes in head position. Pt reports prior to admission he would have brief periods of double vision and instability/dizziness which would last for 2-3 minutes at a time, sometimes occurring when sitting still. Pt's symptoms do not appear to be peripheral in nature at this time. PT is unable to provoke symptoms during session. Pt has no further acute PT needs at this time. Acute PT signing off.        Assistance Recommended at Discharge None  If plan is discharge home, recommend the following:  Can travel by private vehicle           Equipment Recommendations None recommended by PT  Recommendations for Other Services       Functional Status Assessment Patient has not had a recent decline in their functional status     Precautions / Restrictions Precautions Precautions: None Restrictions Weight Bearing Restrictions: No      Mobility  Bed Mobility Overal bed mobility: Independent                  Transfers Overall transfer level: Independent                      Ambulation/Gait Ambulation/Gait assistance: Independent Gait Distance (Feet): 400  Feet Assistive device: None Gait Pattern/deviations: WFL(Within Functional Limits) Gait velocity: functional Gait velocity interpretation: >2.62 ft/sec, indicative of community ambulatory   General Gait Details: stady step-through gait, changes direction and gait speed well. No significant balance deviations noted with head turns. Walks backward and steps over objects well  Stairs            Wheelchair Mobility     Tilt Bed    Modified Rankin (Stroke Patients Only)       Balance Overall balance assessment: Independent                                           Pertinent Vitals/Pain Pain Assessment Pain Assessment: No/denies pain    Home Living Family/patient expects to be discharged to:: Private residence Living Arrangements: Alone Available Help at Discharge: Other (Comment) (fiance coming tonight) Type of Home: Apartment Home Access: Level entry       Home Layout: One level Home Equipment: None      Prior Function Prior Level of Function : Independent/Modified Independent;Driving             Mobility Comments: independent ADLs Comments: independent     Hand Dominance   Dominant Hand: Left    Extremity/Trunk Assessment   Upper Extremity Assessment Upper Extremity Assessment: Overall WFL for tasks assessed  Lower Extremity Assessment Lower Extremity Assessment: Overall WFL for tasks assessed    Cervical / Trunk Assessment Cervical / Trunk Assessment: Normal  Communication   Communication: No difficulties  Cognition Arousal/Alertness: Awake/alert Behavior During Therapy: WFL for tasks assessed/performed Overall Cognitive Status: Within Functional Limits for tasks assessed                                          General Comments General comments (skin integrity, edema, etc.): VSS on RA. Pursuits and saccades negative. VOR horizontal and veritcal negative. Head impulse test deferred due to known  severe vertebral artery stenosis. Pt does not report a pattern of symptoms associtated with head turns or positional changes, in fact her reports some instances when sitting still.    Exercises     Assessment/Plan    PT Assessment Patient does not need any further PT services  PT Problem List         PT Treatment Interventions      PT Goals (Current goals can be found in the Care Plan section)       Frequency       Co-evaluation               AM-PAC PT "6 Clicks" Mobility  Outcome Measure Help needed turning from your back to your side while in a flat bed without using bedrails?: None Help needed moving from lying on your back to sitting on the side of a flat bed without using bedrails?: None Help needed moving to and from a bed to a chair (including a wheelchair)?: None Help needed standing up from a chair using your arms (e.g., wheelchair or bedside chair)?: None Help needed to walk in hospital room?: None Help needed climbing 3-5 steps with a railing? : None 6 Click Score: 24    End of Session   Activity Tolerance: Patient tolerated treatment well Patient left: in bed;with call bell/phone within reach Nurse Communication: Mobility status PT Visit Diagnosis: Other symptoms and signs involving the nervous system (R29.898)    Time: 1610-9604 PT Time Calculation (min) (ACUTE ONLY): 21 min   Charges:   PT Evaluation $PT Eval Low Complexity: 1 Low   PT General Charges $$ ACUTE PT VISIT: 1 Visit         Arlyss Gandy, PT, DPT Acute Rehabilitation Office [phone]   Arlyss Gandy 03/15/2023, 1:09 PM

## 2023-03-15 NOTE — Progress Notes (Signed)
PROGRESS NOTE ALBION WEATHERHOLTZ  ZOX:[id number] DOB: 05/05/1956 DOA: 03/14/2023 PCP: Farris Has, MD  Brief Narrative/Hospital Course: 48yom w/ T2DM,HTN,chronic smoker presented to ED w/ complaining of intermittent double vision,occurring approximate daily for past 1 week however happened 2-3 times on 7/9,lasts about 3 to 5 minutes He has not been taking any medication at all as he has financial problem and cannot afford any medication. Seen in UC then was referred to [facility] ED where he was diagnosed with TIA and encouraged to be admitted but he left due to runs of errands and returned to [facility] ED later  in the night around 8 PM for evaluation. In ED:  VS:176/104, heart rate 100, respiratory 17 and O2 sat 96% on room air.  Labs: CMP sodium 135, potassium 3.7, chloride 103, low bicarb 20, blood glucose 134 otherwise unremarkable. CBC slightly elevated WBC count 10.6. Elevated troponin 39 and 40, EKG showed sinus rhythm without any ST T wave abnormality and he was chest pain-free. CT angio head>>obtained which showed no emergent large vessel occlusion.  Severe left intradural vertebral artery stenosis, severe bilateral vertebral artery stenosis, moderate bilateral intracranial ICA stenosis, moderate left and mild P2 PCA stenosis. Patient was admitted for TIA w/ b/l vertebral artery stenosis and monitored intracranial ICA stenosis-MRI brain had no acute finding.     Subjective: Seen examined this morning he feels well no new complaints   Assessment and Plan: Principal Problem:   TIA (transient ischemic attack) Active Problems:   Essential hypertension   Non-insulin dependent type 2 diabetes mellitus (HCC)   Elevated troponin   Vertebral artery stenosis   Bipolar 1 disorder, mixed, partial remission (HCC)   TIA with intermittent double vision: Admitted to complete stroke workup, CT angio head showed severe left intradural vertebral artery stenosis, severe bilateral vertebral artery  origin stenosis, moderate bilateral intracranial ICA stenosis, moderate left and mild right P2 PCA stenosis.  LDL 95 HbA1c 9.0.  Echo ordered.Neurology consulted MRI brain and orbit no acute finding  Continue aspirin, Lipitor.  Vascular surgery Dr. Ollen Bowl reviewed imaging advised neuro IR involvement and consulted Dr. Terald Sleeper  Hypertension: Noncompliant with medication, started on losartan 50 mg. Elevated troponin: Likely demand ischemia in the setting of uncontrolled hypertension known delta, not consistent with ACS.  Follow-up echo Type 2 diabetes mellitus A1c 9 PTA Jardiance metformin resume meds upon discharge GAD: Previously on Lamictal , Latuda 20, psych was consulted  DVT prophylaxis: enoxaparin (LOVENOX) injection 40 mg Start: 03/14/23 2315 SCD's Start: 03/14/23 2302 Code Status:   Code Status: Full Code Family Communication: plan of care discussed with patient at bedside. Patient status is:  admitted as observation but remains hospitalized for ongoing  because of vertebral artery stenosis, TIA symptoms Level of care: Telemetry Medical   Dispo: The patient is from: home            Anticipated disposition: home Objective: Vitals last 24 hrs: Vitals:   03/15/23 1030 03/15/23 1045 03/15/23 1415 03/15/23 1456  BP: (!) 161/88 (!) 141/101 (!) 132/98 (!) 143/92  Pulse:   (!) 52   Resp: 14 14 15 15   Temp:   97.8 F (36.6 C) 98.2 F (36.8 C)  TempSrc:   Oral Oral  SpO2:   100% 98%   Weight change:   Physical Examination: General exam: alert awake, older than stated age HEENT:Oral mucosa moist, Ear/Nose WNL grossly Respiratory system: bilaterally clear BS, no use of accessory muscle Cardiovascular system: S1 & S2 +, No JVD. Gastrointestinal  system: Abdomen soft,NT,ND, BS+ Nervous System:Alert, awake, moving extremities. Extremities: LE edema neg,distal peripheral pulses palpable.  Skin: No rashes,no icterus. MSK: Normal muscle bulk,tone, power  Medications reviewed:   Scheduled Meds:   stroke: early stages of recovery book   Does not apply Once   aspirin EC  81 mg Oral Daily   atorvastatin  40 mg Oral Daily   empagliflozin  10 mg Oral Daily   enoxaparin (LOVENOX) injection  40 mg Subcutaneous Q24H   insulin aspart  0-6 Units Subcutaneous TID WC   losartan  50 mg Oral Daily   Continuous Infusions:   Diet Order             Diet heart healthy/carb modified Room service appropriate? Yes; Fluid consistency: Thin  Diet effective now                  No intake or output data in the 24 hours ending 03/15/23 1510 Net IO Since Admission: No IO data has been entered for this period [03/15/23 1510]  Wt Readings from Last 3 Encounters:  03/14/23 100.7 kg  12/02/21 103.3 kg  11/19/21 100.8 kg     Unresulted Labs (From admission, onward)    None     Data Reviewed: I have personally reviewed following labs and imaging studies CBC: Recent Labs  Lab 03/14/23 1415 03/15/23 0001  WBC 10.6* 9.6  HGB 15.5 14.9  HCT 45.1 44.1  MCV 89.7 90.9  PLT 256 225   Basic Metabolic Panel: Recent Labs  Lab 03/14/23 1415 03/15/23 0001  NA 135 136  K 3.7 3.7  CL 103 105  CO2 20* 23  GLUCOSE 324* 187*  BUN 9 11  CREATININE 0.98 0.99  CALCIUM 9.3 8.7*   GFR: Estimated Creatinine Clearance: 87.7 mL/min (by C-G formula based on SCr of 0.99 mg/dL). Liver Function Tests: Recent Labs  Lab 03/15/23 0001  AST 17  ALT 17  ALKPHOS 97  BILITOT 0.5  PROT 6.4*  ALBUMIN 3.6   No results for input(s): "LIPASE", "AMYLASE" in the last 168 hours. No results for input(s): "AMMONIA" in the last 168 hours. Coagulation Profile: No results for input(s): "INR", "PROTIME" in the last 168 hours. BNP (last 3 results) No results for input(s): "PROBNP" in the last 8760 hours. HbA1C: Recent Labs    03/14/23 2300  HGBA1C 9.0*   CBG: Recent Labs  Lab 03/15/23 0400 03/15/23 0839 03/15/23 1249  GLUCAP 174* 233* 135*   Lipid Profile: Recent Labs     03/14/23 2300  CHOL 193  HDL 40*  LDLCALC 97  TRIG 161*  CHOLHDL 4.8   Thyroid Function Tests: No results for input(s): "TSH", "T4TOTAL", "FREET4", "T3FREE", "THYROIDAB" in the last 72 hours. Sepsis Labs: No results for input(s): "PROCALCITON", "LATICACIDVEN" in the last 168 hours.  No results found for this or any previous visit (from the past 240 hour(s)).  Antimicrobials: Anti-infectives (From admission, onward)    None      Culture/Microbiology No results found for: "SDES", "SPECREQUEST", "CULT", "REPTSTATUS"  Other culture-see note  Radiology Studies: MR BRAIN W WO CONTRAST  Result Date: 03/15/2023 CLINICAL DATA:  Ophthalmoplegia. EXAM: MRI HEAD AND ORBITS WITHOUT AND WITH CONTRAST TECHNIQUE: Multiplanar, multiecho pulse sequences of the brain and surrounding structures were obtained without and with intravenous contrast. Multiplanar, multiecho pulse sequences of the orbits and surrounding structures were obtained including fat saturation techniques, before and after intravenous contrast administration. CONTRAST:  10mL GADAVIST GADOBUTROL 1 MMOL/ML IV SOLN COMPARISON:  CTA head/neck 03/14/2023. FINDINGS: MRI HEAD FINDINGS Brain: No acute infarct or hemorrhage. Mild chronic small-vessel disease. No hydrocephalus or extra-axial collection. No foci of abnormal susceptibility. No mass or abnormal enhancement. Vascular: Normal flow voids. Intracranial atherosclerosis is better evaluated on previous day's head CTA. Skull and upper cervical spine: Normal marrow signal and enhancement. Other: None. MRI ORBITS FINDINGS Orbits: No traumatic or inflammatory finding. Globes, optic nerves, orbital fat, extraocular muscles, vascular structures, and lacrimal glands are normal. Visualized sinuses: Moderate mucosal disease in the right maxillary sinus and bilateral anterior ethmoid air cells. Soft tissues: Unremarkable. IMPRESSION: 1. No acute intracranial abnormality or mass. 2. Mild chronic  small-vessel disease. 3. Unremarkable MRI of the orbits. 4. Moderate right maxillary sinus disease. Electronically Signed   By: Orvan Falconer M.D.   On: 03/15/2023 12:41   MR ORBITS W WO CONTRAST  Result Date: 03/15/2023 CLINICAL DATA:  Ophthalmoplegia. EXAM: MRI HEAD AND ORBITS WITHOUT AND WITH CONTRAST TECHNIQUE: Multiplanar, multiecho pulse sequences of the brain and surrounding structures were obtained without and with intravenous contrast. Multiplanar, multiecho pulse sequences of the orbits and surrounding structures were obtained including fat saturation techniques, before and after intravenous contrast administration. CONTRAST:  10mL GADAVIST GADOBUTROL 1 MMOL/ML IV SOLN COMPARISON:  CTA head/neck 03/14/2023. FINDINGS: MRI HEAD FINDINGS Brain: No acute infarct or hemorrhage. Mild chronic small-vessel disease. No hydrocephalus or extra-axial collection. No foci of abnormal susceptibility. No mass or abnormal enhancement. Vascular: Normal flow voids. Intracranial atherosclerosis is better evaluated on previous day's head CTA. Skull and upper cervical spine: Normal marrow signal and enhancement. Other: None. MRI ORBITS FINDINGS Orbits: No traumatic or inflammatory finding. Globes, optic nerves, orbital fat, extraocular muscles, vascular structures, and lacrimal glands are normal. Visualized sinuses: Moderate mucosal disease in the right maxillary sinus and bilateral anterior ethmoid air cells. Soft tissues: Unremarkable. IMPRESSION: 1. No acute intracranial abnormality or mass. 2. Mild chronic small-vessel disease. 3. Unremarkable MRI of the orbits. 4. Moderate right maxillary sinus disease. Electronically Signed   By: Orvan Falconer M.D.   On: 03/15/2023 12:41   CT ANGIO HEAD NECK W WO CM  Result Date: 03/14/2023 CLINICAL DATA:  Transient ischemic attack (TIA) intermittent diplopia EXAM: CT ANGIOGRAPHY HEAD AND NECK WITH AND WITHOUT CONTRAST TECHNIQUE: Multidetector CT imaging of the head and neck  was performed using the standard protocol during bolus administration of intravenous contrast. Multiplanar CT image reconstructions and MIPs were obtained to evaluate the vascular anatomy. Carotid stenosis measurements (when applicable) are obtained utilizing NASCET criteria, using the distal internal carotid diameter as the denominator. RADIATION DOSE REDUCTION: This exam was performed according to the departmental dose-optimization program which includes automated exposure control, adjustment of the mA and/or kV according to patient size and/or use of iterative reconstruction technique. CONTRAST:  80mL OMNIPAQUE IOHEXOL 350 MG/ML SOLN COMPARISON:  None Available. FINDINGS: CT HEAD FINDINGS Brain: No evidence of acute infarction, hemorrhage, hydrocephalus, extra-axial collection or mass lesion/mass effect. Vascular: See below. Skull: No acute fracture. Sinuses/Orbits: Moderate right maxillary sinus mucosal thickening and mild scattered ethmoid air cell mucosal thickening. No acute orbital findings. Other: No mastoid effusions. Review of the MIP images confirms the above findings CTA NECK FINDINGS Aortic arch: Great vessel origins are patent. Right carotid system: Atherosclerosis at the carotid bifurcation involving the proximal ICA without greater than 50% stenosis relative to the distal vessel. Left carotid system: Carotid bifurcation atherosclerosis without greater than 50% stenosis. Vertebral arteries: Severe stenosis of the bilateral vertebral artery origins due to calcific atherosclerosis.  Otherwise, vertebral arteries are patent without significant stenosis. Skeleton: No acute abnormality on limited assessment. Other neck: No acute abnormality on limited assessment. Upper chest: Visualized lung apices are clear. Review of the MIP images confirms the above findings CTA HEAD FINDINGS Anterior circulation: Bilateral intracranial ICAs are patent with moderate narrowing bilaterally due to calcific atherosclerosis.  Bilateral MCAs and bilateral ACAs are patent without proximal hemodynamically significant stenosis. Posterior circulation: Severe stenosis of the proximal left intradural vertebral artery. Right intradural vertebral artery, basilar artery and both posterior cerebral arteries are patent. Moderate left and mild right P2 PCA stenosis. Venous sinuses: As permitted by contrast timing, patent. Review of the MIP images confirms the above findings IMPRESSION: 1. No emergent large vessel occlusion. 2. Severe left intradural vertebral artery stenosis. 3. Severe bilateral vertebral artery origin stenosis. 4. Moderate bilateral intracranial ICA stenosis. 5. Moderate left and mild right P2 PCA stenosis. Electronically Signed   By: Feliberto Harts M.D.   On: 03/14/2023 17:55     LOS: 0 days   Lanae Boast, MD [facility]  03/15/2023, 3:10 PM

## 2023-03-15 NOTE — Consult Note (Signed)
Neurology Consultation  Reason for Consult: Recurrent episodes of diplopia and dizziness Referring Physician: Dr. Jonathon Bellows  CC: Recurrent episodes of diplopia and dizziness  History is obtained from: Patient and chart  HPI: Devin Ellison is a 67 y.o. male with history of diabetes, hypertension, smoking and bipolar disorder who presents with recurrent episodes of diplopia and dizziness.  Patient reports that these episodes have been happening daily for about a week with each episode lasting for about 5 minutes.  He reports sudden onset horizontal diplopia which goes away if he closes 1 eye as well as dizziness.  He states that normally he closes his eyes and leans up against something and wakes for the episode to pass.  He is unsure if the diplopia is worse when he is looking far away or close out.  He describes the dizziness as feeling as though the room is spinning, although during 1 episode, his knees got weak and he felt like he was about to pass out as well.  Patient states that sometimes he wishes he would not wake up in the morning but denies active SI.  He states that he had a cold a few weeks ago but has had no other recent symptoms.   LKW: n/a TNK given?: no, symptoms resolved IR Thrombectomy? No, no LVO Modified Rankin Scale: 0-Completely asymptomatic and back to baseline post- stroke  ROS: A complete ROS was performed and is negative except as noted in the HPI.   Past Medical History:  Diagnosis Date   Bipolar I disorder in remission    Last Assessment & Plan:  Patient voluntarily presents for evaluation citing increasingly reckless behavior over the past week, initially diagnosed with bipolar disorder 2014 has been managed on lamotrigine, Seroquel, stated he does not want to wake up in the morning but denies any plans -Evaluated by psychiatry in ED, IVC'd -Seroquel 300 mg   CKD (chronic kidney disease) 05/16/2021   Last Assessment & Plan: Renal fxn stable   Dermatitis of left ear  canal 10/14/2018   Essential hypertension 03/13/2017   Gastroesophageal reflux disease without esophagitis 05/20/2021   Generalized anxiety disorder    Hardening of the aorta (main artery of the heart)    Hearing loss    Hyperlipidemia 05/20/2021   Major depressive disorder 04/30/2013   Obstructive sleep apnea    No CPAP use   Recurrent colon polyp s/p robotic colectomy 04/11/2016   Type II diabetes mellitus, noninsulin dependent 05/16/2021   Last Assessment & Plan: Restart on metformin at 500mg  bid, titrate -stable     Family History  Problem Relation Age of Onset   Heart disease Mother    Cerebral palsy Sister 32   Leukemia Sister    Breast cancer Sister    Depression Paternal Uncle    Bipolar disorder Paternal Uncle    Suicidality Paternal Aunt      Social History:   reports that he has been smoking cigarettes. He has been smoking an average of 1 pack per day. He has never used smokeless tobacco. He reports that he does not currently use alcohol. He reports that he does not use drugs.  Medications  Current Facility-Administered Medications:     stroke: early stages of recovery book, , Does not apply, Once, Sundil, Subrina, MD   acetaminophen (TYLENOL) tablet 650 mg, 650 mg, Oral, Q4H PRN **OR** acetaminophen (TYLENOL) 160 MG/5ML solution 650 mg, 650 mg, Per Tube, Q4H PRN **OR** acetaminophen (TYLENOL) suppository 650 mg, 650 mg, Rectal,  Q4H PRN, Janalyn Shy, Subrina, MD   aspirin EC tablet 81 mg, 81 mg, Oral, Daily, Sundil, Subrina, MD   atorvastatin (LIPITOR) tablet 40 mg, 40 mg, Oral, Daily, Sundil, Subrina, MD, 40 mg at 03/15/23 0002   empagliflozin (JARDIANCE) tablet 10 mg, 10 mg, Oral, Daily, Sundil, Subrina, MD   enoxaparin (LOVENOX) injection 40 mg, 40 mg, Subcutaneous, Q24H, Sundil, Subrina, MD, 40 mg at 03/14/23 2355   hydrALAZINE (APRESOLINE) injection 10 mg, 10 mg, Intravenous, Q6H PRN, Sundil, Subrina, MD   insulin aspart (novoLOG) injection 0-6 Units, 0-6 Units,  Subcutaneous, TID WC, Sundil, Subrina, MD, 1 Units at 03/15/23 0402   losartan (COZAAR) tablet 50 mg, 50 mg, Oral, Daily, Sundil, Subrina, MD   ondansetron [facility]) injection 4 mg, 4 mg, Intravenous, Q6H PRN, Sundil, Subrina, MD   senna-docusate (Senokot-S) tablet 1 tablet, 1 tablet, Oral, QHS PRN, Janalyn Shy, Subrina, MD  Current Outpatient Medications:    amLODipine (NORVASC) 10 MG tablet, Take 10 mg by mouth daily., Disp: , Rfl:    atorvastatin (LIPITOR) 10 MG tablet, Take 10 mg by mouth daily., Disp: , Rfl:    divalproex (DEPAKOTE ER) 500 MG 24 hr tablet, Take 2,000 mg by mouth daily., Disp: , Rfl:    empagliflozin (JARDIANCE) 10 MG TABS tablet, Take 10 mg by mouth daily., Disp: , Rfl:    lamoTRIgine (LAMICTAL) 100 MG tablet, Take 100 mg by mouth 2 (two) times daily., Disp: , Rfl:    losartan (COZAAR) 100 MG tablet, TAKE 1 TABLET BY MOUTH EVERY DAY, Disp: 30 tablet, Rfl: 0   lurasidone (LATUDA) 20 MG TABS tablet, Take 20 mg by mouth every evening., Disp: , Rfl:    metFORMIN (GLUCOPHAGE) 1000 MG tablet, Take 1,000 mg by mouth 2 (two) times daily with a meal., Disp: , Rfl:    Exam: Current vital signs: BP 125/82 (BP Location: Right Arm)   Pulse 71   Temp 97.7 F (36.5 C) (Oral)   Resp 16   SpO2 99%  Vital signs in last 24 hours: Temp:  [97.7 F (36.5 C)-99.6 F (37.6 C)] 97.7 F (36.5 C) (07/10 0752) Pulse Rate:  [53-100] 71 (07/10 0752) Resp:  [14-22] 16 (07/10 0752) BP: (113-180)/(74-114) 125/82 (07/10 0752) SpO2:  [95 %-100 %] 99 % (07/10 0752) Weight:  [100.7 kg] 100.7 kg (07/09 1407)  GENERAL: Awake, alert, in no acute distress Psych: Affect appropriate for situation, patient is calm and cooperative with examination Head: Normocephalic and atraumatic, without obvious abnormality EENT: Normal conjunctivae, moist mucous membranes, no OP obstruction LUNGS: Normal respiratory effort. Non-labored breathing on room air CV: Regular rate and rhythm on telemetry Extremities: warm,  well perfused, without obvious deformity  NEURO:  Mental Status: Awake, alert, and oriented to person, place, time, and situation. He is able to provide a clear and coherent history of present illness. Speech/Language: speech is clear and fluent.   No neglect is noted Cranial Nerves:  II: PERRL III, IV, VI: EOMI. Lid elevation symmetric and full. No double vision elicited at the time of exam. Endgaze nystagmus on rightward gaze rapidly resolves. No endgaze nystagmus with leftward gaze.  V: Sensation is intact to light touch and symmetrical to face.  VII: Face is symmetric resting and smiling.  VIII: Hearing intact to voice IX, X: Phonation normal.  XI: Normal sternocleidomastoid and trapezius muscle strength XII: Tongue protrudes midline without fasciculations.   Motor: 5/5 strength is all muscle groups.  Tone is normal. Bulk is normal.  Sensation: Intact to light  touch bilaterally in all four extremities.  Coordination: FTN intact bilaterally. HKS intact bilaterally. No pronator drift.  Gait: Deferred  NIHSS: 1a Level of Conscious.: 0 1b LOC Questions: 0 1c LOC Commands: 0 2 Best Gaze: 0 3 Visual: 0 4 Facial Palsy: 0 5a Motor Arm - left: 0 5b Motor Arm - Right: 0 6a Motor Leg - Left: 0 6b Motor Leg - Right: 0 7 Limb Ataxia: 0 8 Sensory: 0 9 Best Language: 0 10 Dysarthria: 0 11 Extinct. and Inatten.: 0 TOTAL: 0   Labs I have reviewed labs in epic and the results pertinent to this consultation are:   CBC    Component Value Date/Time   WBC 9.6 03/15/2023 0001   RBC 4.85 03/15/2023 0001   HGB 14.9 03/15/2023 0001   HCT 44.1 03/15/2023 0001   PLT 225 03/15/2023 0001   MCV 90.9 03/15/2023 0001   MCH 30.7 03/15/2023 0001   MCHC 33.8 03/15/2023 0001   RDW 12.7 03/15/2023 0001   LYMPHSABS 2.5 11/05/2022 1209   MONOABS 1.0 11/05/2022 1209   EOSABS 0.2 11/05/2022 1209   BASOSABS 0.1 11/05/2022 1209    CMP     Component Value Date/Time   NA 136 03/15/2023 0001    NA 140 10/07/2019 1309   K 3.7 03/15/2023 0001   CL 105 03/15/2023 0001   CO2 23 03/15/2023 0001   GLUCOSE 187 (H) 03/15/2023 0001   BUN 11 03/15/2023 0001   BUN 13 10/07/2019 1309   CREATININE 0.99 03/15/2023 0001   CREATININE 0.92 11/09/2014 1507   CALCIUM 8.7 (L) 03/15/2023 0001   PROT 6.4 (L) 03/15/2023 0001   PROT 6.8 10/07/2019 1309   ALBUMIN 3.6 03/15/2023 0001   ALBUMIN 4.6 10/07/2019 1309   AST 17 03/15/2023 0001   ALT 17 03/15/2023 0001   ALKPHOS 97 03/15/2023 0001   BILITOT 0.5 03/15/2023 0001   BILITOT 0.6 10/07/2019 1309   GFRNONAA >60 03/15/2023 0001   GFRAA 85 10/07/2019 1309    Lipid Panel     Component Value Date/Time   CHOL 193 03/14/2023 2300   CHOL 173 10/07/2019 1309   TRIG 279 (H) 03/14/2023 2300   HDL 40 (L) 03/14/2023 2300   HDL 59 10/07/2019 1309   CHOLHDL 4.8 03/14/2023 2300   VLDL 56 (H) 03/14/2023 2300   LDLCALC 97 03/14/2023 2300   LDLCALC 96 10/07/2019 1309     Imaging I have reviewed the images obtained:  CT-scan of the brain: No acute abnormality  CTA head and neck: No emergent LVO, severe left intradural vertebral artery stenosis, severe bilateral vertebral artery origin stenosis, moderate bilateral cranial ICA stenosis and moderate left and right P2 PCA stenosis  MRI examination of the brain with and without contrast: Pending  Assessment: 67 year old patient with history of diabetes, hypertension, smoking and bipolar disorder presents with recurrent episodes of dizziness and intermittent horizontal diplopia, each episode lasting for about 5 minutes.  Dizziness is described as the room spinning but has had 1 episode of dizziness described as a presyncopal sensation.   - Exam is nonfocal. There is brief fatigable nystagmus when gazing to the right.  - Will need to obtain echocardiogram given presyncope with one event.   - Multifocal posterior circulation arterial stenosis seen on CT angiogram. Vertebrobasilar insufficiency in the  setting of transient hypotension may explain his dizziness episodes and also could explain accompanying diplopia.  - Patient certainly does have plenty of uncontrolled risk factors for stroke as he has  not taken any of his medications recently due to financial difficulties.   - Although, recurrent TIAs for 7 days with the same symptoms would be unusual if an embolic phenomenon, this could occur with vertebrobasilar insufficiency given given posterior circulation stenoses.  Will investigate with MRI with and without contrast.  If acute stroke seen, will admit for full stroke workup.  Impression: Possible recurrent TIA versus acute ischemic stroke in patient with multifocal posterior circulation stenosis  Recommendations: -MRI brain with and without contrast -Admit for stroke workup if MRI positive -Continue aspirin 81 mg daily, which has been started this admission.  -Statin -Echocardiogram -Cardiac telemetry  Addendum: MRI brain and orbits is negative for acute abnormality or mass  Pt seen by NP/Neuro and later by MD. Note/plan to be edited by MD as needed.  Cortney E Ernestina Columbia , MSN, AGACNP-BC [facility] See Amion for schedule and pager information 03/15/2023 8:08 AM  I have seen and examined the patient. I have formulated the assessment and recommendations. 67 year old patient with history of diabetes, hypertension, smoking and bipolar disorder presents with recurrent episodes of dizziness and intermittent horizontal diplopia, each episode lasting for about 5 minutes.  Dizziness is described as the room spinning but has had 1 episode of dizziness described as a presyncopal sensation.  Exam is nonfocal. DDx and recommendations as above.  Electronically signed: Dr. Caryl Pina

## 2023-03-16 ENCOUNTER — Observation Stay (HOSPITAL_COMMUNITY): Payer: PPO

## 2023-03-16 ENCOUNTER — Other Ambulatory Visit (HOSPITAL_COMMUNITY): Payer: Self-pay

## 2023-03-16 ENCOUNTER — Observation Stay (HOSPITAL_BASED_OUTPATIENT_CLINIC_OR_DEPARTMENT_OTHER): Payer: PPO

## 2023-03-16 DIAGNOSIS — G459 Transient cerebral ischemic attack, unspecified: Secondary | ICD-10-CM | POA: Diagnosis not present

## 2023-03-16 DIAGNOSIS — I6502 Occlusion and stenosis of left vertebral artery: Secondary | ICD-10-CM | POA: Diagnosis not present

## 2023-03-16 DIAGNOSIS — I6522 Occlusion and stenosis of left carotid artery: Secondary | ICD-10-CM | POA: Diagnosis not present

## 2023-03-16 HISTORY — PX: IR US GUIDE VASC ACCESS RIGHT: IMG2390

## 2023-03-16 HISTORY — PX: IR ANGIO INTRA EXTRACRAN SEL COM CAROTID INNOMINATE BILAT MOD SED: IMG5360

## 2023-03-16 HISTORY — PX: IR ANGIO VERTEBRAL SEL VERTEBRAL BILAT MOD SED: IMG5369

## 2023-03-16 LAB — GLUCOSE, CAPILLARY
Glucose-Capillary: 180 mg/dL — ABNORMAL HIGH (ref 70–99)
Glucose-Capillary: 248 mg/dL — ABNORMAL HIGH (ref 70–99)
Glucose-Capillary: 153 mg/dL — ABNORMAL HIGH (ref 70–99)

## 2023-03-16 LAB — ECHOCARDIOGRAM COMPLETE
AR max vel: 4.14 cm2
AV Peak grad: 3.3 mmHg
Ao pk vel: 0.92 m/s
Area-P 1/2: 3.27 cm2
Height: 72 in
S' Lateral: 3.6 cm
Weight: 3552 oz

## 2023-03-16 LAB — PROTIME-INR
INR: 1 (ref 0.8–1.2)
Prothrombin Time: 13.3 seconds (ref 11.4–15.2)

## 2023-03-16 MED ORDER — FENTANYL CITRATE (PF) 100 MCG/2ML IJ SOLN
INTRAMUSCULAR | Status: AC | PRN
  Administered 2023-03-16 (×2): 25 ug via INTRAVENOUS

## 2023-03-16 MED ORDER — SODIUM CHLORIDE (PF) 0.9 % IJ SOLN
INTRAVENOUS | Status: AC | PRN
  Administered 2023-03-16: 200 ug via INTRA_ARTERIAL

## 2023-03-16 MED ORDER — MIDAZOLAM HCL 2 MG/2ML IJ SOLN
INTRAMUSCULAR | Status: AC
  Filled 2023-03-16: qty 2

## 2023-03-16 MED ORDER — IOHEXOL 300 MG/ML  SOLN
100.0000 mL | Freq: Once | INTRAMUSCULAR | Status: AC | PRN
  Administered 2023-03-16: 25 mL via INTRA_ARTERIAL

## 2023-03-16 MED ORDER — IOHEXOL 300 MG/ML  SOLN
150.0000 mL | Freq: Once | INTRAMUSCULAR | Status: AC | PRN
  Administered 2023-03-16: 60 mL via INTRA_ARTERIAL

## 2023-03-16 MED ORDER — NITROGLYCERIN 1 MG/10 ML FOR IR/CATH LAB
INTRA_ARTERIAL | Status: AC | PRN
  Administered 2023-03-16: 200 ug via INTRA_ARTERIAL

## 2023-03-16 MED ORDER — HEPARIN SODIUM (PORCINE) 1000 UNIT/ML IJ SOLN
INTRAMUSCULAR | Status: AC
  Filled 2023-03-16: qty 10

## 2023-03-16 MED ORDER — CLOPIDOGREL BISULFATE 75 MG PO TABS
300.0000 mg | ORAL_TABLET | Freq: Once | ORAL | Status: AC
  Administered 2023-03-16: 300 mg via ORAL
  Filled 2023-03-16: qty 4

## 2023-03-16 MED ORDER — VERAPAMIL HCL 2.5 MG/ML IV SOLN
INTRAVENOUS | Status: AC
  Filled 2023-03-16: qty 2

## 2023-03-16 MED ORDER — LOSARTAN POTASSIUM 50 MG PO TABS
50.0000 mg | ORAL_TABLET | Freq: Every day | ORAL | 0 refills | Status: AC
  Filled 2023-03-16: qty 30, 30d supply, fill #0

## 2023-03-16 MED ORDER — EMPAGLIFLOZIN 10 MG PO TABS
10.0000 mg | ORAL_TABLET | Freq: Every day | ORAL | 0 refills | Status: AC
  Filled 2023-03-16: qty 30, 30d supply, fill #0

## 2023-03-16 MED ORDER — LIDOCAINE HCL 1 % IJ SOLN
20.0000 mL | Freq: Once | INTRAMUSCULAR | Status: AC
  Administered 2023-03-16: 10 mL via INTRADERMAL
  Filled 2023-03-16: qty 20

## 2023-03-16 MED ORDER — NITROGLYCERIN 1 MG/10 ML FOR IR/CATH LAB
INTRA_ARTERIAL | Status: AC
  Filled 2023-03-16: qty 10

## 2023-03-16 MED ORDER — FENTANYL CITRATE (PF) 100 MCG/2ML IJ SOLN
INTRAMUSCULAR | Status: AC
  Filled 2023-03-16: qty 2

## 2023-03-16 MED ORDER — SODIUM CHLORIDE 0.9 % IV SOLN
INTRAVENOUS | Status: AC | PRN
  Administered 2023-03-16: 250 mL via INTRAVENOUS

## 2023-03-16 MED ORDER — ATORVASTATIN CALCIUM 40 MG PO TABS
40.0000 mg | ORAL_TABLET | Freq: Every day | ORAL | 0 refills | Status: AC
  Filled 2023-03-16: qty 30, 30d supply, fill #0

## 2023-03-16 MED ORDER — VERAPAMIL HCL 2.5 MG/ML IV SOLN
INTRAVENOUS | Status: AC | PRN
  Administered 2023-03-16: 2.5 mg via INTRA_ARTERIAL

## 2023-03-16 MED ORDER — MIDAZOLAM HCL 2 MG/2ML IJ SOLN
INTRAMUSCULAR | Status: AC | PRN
  Administered 2023-03-16: .5 mg via INTRAVENOUS
  Administered 2023-03-16: 1 mg via INTRAVENOUS

## 2023-03-16 MED ORDER — ASPIRIN 81 MG PO TBEC
81.0000 mg | DELAYED_RELEASE_TABLET | Freq: Every day | ORAL | 0 refills | Status: AC
  Filled 2023-03-16: qty 120, 120d supply, fill #0

## 2023-03-16 MED ORDER — AMLODIPINE BESYLATE 10 MG PO TABS
10.0000 mg | ORAL_TABLET | Freq: Every day | ORAL | 0 refills | Status: AC
  Filled 2023-03-16: qty 30, 30d supply, fill #0

## 2023-03-16 MED ORDER — HEPARIN SODIUM (PORCINE) 1000 UNIT/ML IJ SOLN
INTRAMUSCULAR | Status: AC | PRN
  Administered 2023-03-16: 2000 [IU] via INTRAVENOUS

## 2023-03-16 MED ORDER — METFORMIN HCL 1000 MG PO TABS
1000.0000 mg | ORAL_TABLET | Freq: Two times a day (BID) | ORAL | 0 refills | Status: AC
  Filled 2023-03-16: qty 60, 30d supply, fill #0

## 2023-03-16 MED ORDER — LIDOCAINE HCL 1 % IJ SOLN
INTRAMUSCULAR | Status: AC
  Filled 2023-03-16: qty 20

## 2023-03-16 MED ORDER — CLOPIDOGREL BISULFATE 75 MG PO TABS: 75.0000 mg | ORAL_TABLET | Freq: Every day | ORAL | Status: DC

## 2023-03-16 MED ORDER — CLOPIDOGREL BISULFATE 75 MG PO TABS
75.0000 mg | ORAL_TABLET | Freq: Every day | ORAL | 2 refills | Status: AC
  Filled 2023-03-16: qty 30, 30d supply, fill #0

## 2023-03-16 NOTE — Progress Notes (Signed)
TR band removed, deflated 12 cc of air, patient tolerated well, no hematoma, vital signs stable, will continue to monitor

## 2023-03-16 NOTE — Sedation Documentation (Signed)
12 cc of air in TR band

## 2023-03-16 NOTE — Progress Notes (Signed)
Echocardiogram 2D Echocardiogram has been performed.  Devin Ellison 03/16/2023, 12:56 PM

## 2023-03-16 NOTE — Progress Notes (Signed)
Patient back to the unit, alert and oriented, vital signs stable. Will continue to monitor.

## 2023-03-16 NOTE — Sedation Documentation (Signed)
Pre/post pulses unchanged

## 2023-03-16 NOTE — Discharge Summary (Signed)
Physician Discharge Summary  Devin Ellison:[id number] DOB: 1956/08/11 DOA: 03/14/2023  PCP: Farris Has, MD  Admit date: 03/14/2023 Discharge date: 03/16/2023 Recommendations for Outpatient Follow-up:  Follow up with PCP in 1 weeks-call for appointment Follow-up with neurology as outpatient Please obtain BMP/CBC in one week  Discharge Dispo: Home Discharge Condition: Stable Code Status:   Code Status: Full Code Diet recommendation:  Diet Order             Diet Carb Modified Fluid consistency: Thin; Room service appropriate? Yes  Diet effective now                    Brief/Interim Summary: 67yom w/ T2DM,HTN,chronic smoker presented to ED w/ complaining of intermittent double vision,occurring approximate daily for past 1 week however happened 2-3 times on 7/9,lasts about 3 to 5 minutes He has not been taking any medication at all as he has financial problem and cannot afford any medication. Seen in UC then was referred to [facility] where he was diagnosed with TIA and encouraged to be admitted but he left due to runs of errands and returned to [facility] ED later  in the night around 8 PM for evaluation. In ED:  VS:176/104, heart rate 100, respiratory 17 and O2 sat 96% on room air.  Labs: CMP sodium 135, potassium 3.7, chloride 103, low bicarb 20, blood glucose 134 otherwise unremarkable. CBC slightly elevated WBC count 10.6. Elevated troponin 39 and 40, EKG showed sinus rhythm without any ST T wave abnormality and he was chest pain-free. CT angio head>>obtained which showed no emergent large vessel occlusion.  Severe left intradural vertebral artery stenosis, severe bilateral vertebral artery stenosis, moderate bilateral intracranial ICA stenosis, moderate left and mild P2 PCA stenosis. Patient was admitted for TIA w/ b/l vertebral artery stenosis and monitored intracranial ICA stenosis-MRI brain had no acute finding. Vascular surgery Dr. Juanetta Gosling was discussed advised  neuro IR evaluation> who saw the patient for severe left intradural vertebral artery stenosis, severe bilateral vertebral artery origin stenosis and other intracranial ICA stenosis and left and right P2 PCA stenosis and decided for cerebral angiography 7/1 taht shows "Severe 90% stenosis at the origin of the dominant left vertebral artery. 2.  Approximately 50 to 60% stenosis of the left vertebral basilar junction just proximal to the left posterior inferior cerebellar artery." Discussed with neurology> after extensive discussion the patient no further intervention recommended, advised to discharge on aspirin Plavix Lipitor follow-up with neurology and PCP as outpatient once echo is resulted. Seen by neurology Dr. Roda Shutters this morning advised discharge with no further intervention at this time.  To continue antiplatelets statin avoid hypotension. His echo came back EF 60 to 65% G1 DD mitral valve normal aortic valve tricuspid  Discharge Diagnoses:  Principal Problem:   TIA (transient ischemic attack) Active Problems:   Essential hypertension   Non-insulin dependent type 2 diabetes mellitus (HCC)   Elevated troponin   Vertebral artery stenosis   Bipolar 1 disorder, mixed, partial remission (HCC)   TIA with intermittent double vision: Admitted for multiple episodes of double vision  Work up done CTA: severe left intradural vertebral artery stenosis, severe bilateral vertebral artery stenosis, moderate bilateral intracranial ICA stenosis, moderate left and mild P2 PCA stenosis. Vascular surgery Dr. Juanetta Gosling was discussed advised neuro IR evaluation> S/P cerebral angiography 7/11 thAt shows "Severe 90% stenosis at the origin of the dominant left vertebral artery. Approximately 50 to 60% stenosis of the left vertebral basilar junction just  proximal to the left posterior inferior cerebellar artery.  LDL 95 HbA1c 9.0. MRI brain and orbit no acute finding  Note advised to continue aspirin Plavix 3 months  following plan aspirin alone, continue Lipitor.    Hypertension: Noncompliant with medication, currently controlled on losartan Elevated troponin: Likely demand ischemia in the setting of uncontrolled hypertension known delta, not consistent with ACS.  Echo was obtained showed normal lvef. Type 2 diabetes mellitus A1c 9 PTA Jardiance metformin resume meds upon discharge GAD: Previously on Lamictal , Latuda 20, psych was consulted-declined psychotropic medication  Consults: Neurology VVS, Neuro IR Subjective: Alert awake oriented no numbness tingling no double vision.  Feels well.  His wife is at the bedside.  Discharge Exam: Vitals:   03/16/23 1120 03/16/23 1200  BP: (!) 141/78 127/84  Pulse:  66  Resp:    Temp:  98 F (36.7 C)  SpO2: 99% 99%   General: Pt is alert, awake, not in acute distress Cardiovascular: RRR, S1/S2 +, no rubs, no gallops Respiratory: CTA bilaterally, no wheezing, no rhonchi Abdominal: Soft, NT, ND, bowel sounds + Extremities: no edema, no cyanosis  Discharge Instructions  Discharge Instructions     Discharge instructions   Complete by: As directed    Please call call MD or return to ER for similar or worsening recurring problem that brought you to hospital or if any fever,nausea/vomiting,abdominal pain, uncontrolled pain, chest pain,  shortness of breath or any other alarming symptoms.  Please follow-up your doctor as instructed in a week time and call the office for appointment.  Please avoid alcohol, smoking, or any other illicit substance and maintain healthy habits including taking your regular medications as prescribed.  You were cared for by a hospitalist during your hospital stay. If you have any questions about your discharge medications or the care you received while you were in the hospital after you are discharged, you can call the unit and ask to speak with the hospitalist on call if the hospitalist that took care of you is not  available.  Once you are discharged, your primary care physician will handle any further medical issues. Please note that NO REFILLS for any discharge medications will be authorized once you are discharged, as it is imperative that you return to your primary care physician (or establish a relationship with a primary care physician if you do not have one) for your aftercare needs so that they can reassess your need for medications and monitor your lab values   Increase activity slowly   Complete by: As directed       Allergies as of 03/16/2023   No Known Allergies      Medication List     TAKE these medications    amLODipine 10 MG tablet Commonly known as: NORVASC Take 1 tablet (10 mg total) by mouth daily.   aspirin EC 81 MG tablet Take 1 tablet (81 mg total) by mouth daily. Swallow whole. Start taking on: March 17, 2023   atorvastatin 40 MG tablet Commonly known as: LIPITOR Take 1 tablet (40 mg total) by mouth daily. Start taking on: March 17, 2023 What changed:  medication strength how much to take   clopidogrel 75 MG tablet Commonly known as: PLAVIX Take 1 tablet (75 mg total) by mouth daily. Start taking on: March 17, 2023   divalproex 500 MG 24 hr tablet Commonly known as: DEPAKOTE ER Take 2,000 mg by mouth daily.   empagliflozin 10 MG Tabs tablet Commonly known as:  JARDIANCE Take 1 tablet (10 mg total) by mouth daily.   lamoTRIgine 100 MG tablet Commonly known as: LAMICTAL Take 100 mg by mouth 2 (two) times daily.   losartan 50 MG tablet Commonly known as: COZAAR Take 1 tablet (50 mg total) by mouth daily. Start taking on: March 17, 2023 What changed:  medication strength how much to take   lurasidone 20 MG Tabs tablet Commonly known as: LATUDA Take 20 mg by mouth every evening.   metFORMIN 1000 MG tablet Commonly known as: GLUCOPHAGE Take 1 tablet (1,000 mg total) by mouth 2 (two) times daily with a meal.        No Known Allergies  The  results of significant diagnostics from this hospitalization (including imaging, microbiology, ancillary and laboratory) are listed below for reference.    Microbiology: No results found for this or any previous visit (from the past 240 hour(s)).  Procedures/Studies: ECHOCARDIOGRAM COMPLETE  Result Date: 03/16/2023    ECHOCARDIOGRAM REPORT   Patient Name:   Devin Ellison Date of Exam: 03/16/2023 Medical Rec #:  [id number]        Height:       72.0 in Accession #:    [id number]       Weight:       222.0 lb Date of Birth:  03-14-1956       BSA:          2.227 m Patient Age:    66 years         BP:           114/64 mmHg Patient Gender: M                HR:           70 bpm. Exam Location:  Inpatient Procedure: 2D Echo, Cardiac Doppler and Color Doppler Indications:    TIA G45.9  History:        Patient has no prior history of Echocardiogram examinations.                 TIA; Risk Factors:Hypertension, Sleep Apnea, Diabetes,                 Dyslipidemia and Current Smoker. CKD, stage 3.  Sonographer:    Lucendia Herrlich Referring Phys: [id number] SUBRINA SUNDIL  Sonographer Comments: Image acquisition challenging due to uncooperative patient. IMPRESSIONS  1. Left ventricular ejection fraction, by estimation, is 60 to 65%. The left ventricle has normal function. The left ventricle has no regional wall motion abnormalities. Left ventricular diastolic parameters are consistent with Grade I diastolic dysfunction (impaired relaxation).  2. Right ventricular systolic function is normal. The right ventricular size is normal.  3. The mitral valve is normal in structure. Trivial mitral valve regurgitation.  4. The aortic valve is tricuspid. Aortic valve regurgitation is not visualized. No aortic stenosis is present.  5. Aortic dilatation noted. There is mild dilatation of the aortic root, measuring 42 mm. There is mild dilatation of the ascending aorta, measuring 41 mm.  6. The inferior vena cava is normal in size with  greater than 50% respiratory variability, suggesting right atrial pressure of 3 mmHg. Comparison(s): No prior Echocardiogram. FINDINGS  Left Ventricle: Left ventricular ejection fraction, by estimation, is 60 to 65%. The left ventricle has normal function. The left ventricle has no regional wall motion abnormalities. The left ventricular internal cavity size was normal in size. There is  no left ventricular hypertrophy. Left ventricular diastolic parameters are consistent  with Grade I diastolic dysfunction (impaired relaxation). Right Ventricle: The right ventricular size is normal. No increase in right ventricular wall thickness. Right ventricular systolic function is normal. Left Atrium: Left atrial size was normal in size. Right Atrium: Right atrial size was normal in size. Pericardium: There is no evidence of pericardial effusion. Mitral Valve: The mitral valve is normal in structure. Trivial mitral valve regurgitation. Tricuspid Valve: The tricuspid valve is normal in structure. Tricuspid valve regurgitation is trivial. Aortic Valve: The aortic valve is tricuspid. Aortic valve regurgitation is not visualized. No aortic stenosis is present. Aortic valve peak gradient measures 3.3 mmHg. Pulmonic Valve: The pulmonic valve was normal in structure. Pulmonic valve regurgitation is not visualized. Aorta: Aortic dilatation noted. There is mild dilatation of the aortic root, measuring 42 mm. There is mild dilatation of the ascending aorta, measuring 41 mm. Venous: The inferior vena cava is normal in size with greater than 50% respiratory variability, suggesting right atrial pressure of 3 mmHg. IAS/Shunts: The atrial septum is grossly normal.  LEFT VENTRICLE PLAX 2D LVIDd:         5.30 cm   Diastology LVIDs:         3.60 cm   LV e' medial:    11.80 cm/s LV PW:         0.90 cm   LV E/e' medial:  3.5 LV IVS:        1.00 cm   LV e' lateral:   7.77 cm/s LVOT diam:     2.50 cm   LV E/e' lateral: 5.4 LV SV:         65 LV SV  Index:   29 LVOT Area:     4.91 cm  RIGHT VENTRICLE             IVC RV S prime:     11.70 cm/s  IVC diam: 1.00 cm TAPSE (M-mode): 2.0 cm LEFT ATRIUM         Index LA diam:    3.70 cm 1.66 cm/m  AORTIC VALVE AV Area (Vmax): 4.14 cm AV Vmax:        91.50 cm/s AV Peak Grad:   3.3 mmHg LVOT Vmax:      77.10 cm/s LVOT Vmean:     50.000 cm/s LVOT VTI:       0.133 m  AORTA Ao Root diam: 4.20 cm Ao Asc diam:  4.10 cm MITRAL VALVE               TRICUSPID VALVE MV Area (PHT): 3.27 cm    TR Peak grad:   4.0 mmHg MV Decel Time: 232 msec    TR Vmax:        100.00 cm/s MV E velocity: 41.70 cm/s MV A velocity: 79.10 cm/s  SHUNTS MV E/A ratio:  0.53        Systemic VTI:  0.13 m                            Systemic Diam: 2.50 cm Laurance Flatten MD Electronically signed by Laurance Flatten MD Signature Date/Time: 03/16/2023/1:29:55 PM    Final    MR BRAIN W WO CONTRAST  Result Date: 03/15/2023 CLINICAL DATA:  Ophthalmoplegia. EXAM: MRI HEAD AND ORBITS WITHOUT AND WITH CONTRAST TECHNIQUE: Multiplanar, multiecho pulse sequences of the brain and surrounding structures were obtained without and with intravenous contrast. Multiplanar, multiecho pulse sequences of the orbits and surrounding structures were obtained including  fat saturation techniques, before and after intravenous contrast administration. CONTRAST:  10mL GADAVIST GADOBUTROL 1 MMOL/ML IV SOLN COMPARISON:  CTA head/neck 03/14/2023. FINDINGS: MRI HEAD FINDINGS Brain: No acute infarct or hemorrhage. Mild chronic small-vessel disease. No hydrocephalus or extra-axial collection. No foci of abnormal susceptibility. No mass or abnormal enhancement. Vascular: Normal flow voids. Intracranial atherosclerosis is better evaluated on previous day's head CTA. Skull and upper cervical spine: Normal marrow signal and enhancement. Other: None. MRI ORBITS FINDINGS Orbits: No traumatic or inflammatory finding. Globes, optic nerves, orbital fat, extraocular muscles, vascular  structures, and lacrimal glands are normal. Visualized sinuses: Moderate mucosal disease in the right maxillary sinus and bilateral anterior ethmoid air cells. Soft tissues: Unremarkable. IMPRESSION: 1. No acute intracranial abnormality or mass. 2. Mild chronic small-vessel disease. 3. Unremarkable MRI of the orbits. 4. Moderate right maxillary sinus disease. Electronically Signed   By: Orvan Falconer M.D.   On: 03/15/2023 12:41   MR ORBITS W WO CONTRAST  Result Date: 03/15/2023 CLINICAL DATA:  Ophthalmoplegia. EXAM: MRI HEAD AND ORBITS WITHOUT AND WITH CONTRAST TECHNIQUE: Multiplanar, multiecho pulse sequences of the brain and surrounding structures were obtained without and with intravenous contrast. Multiplanar, multiecho pulse sequences of the orbits and surrounding structures were obtained including fat saturation techniques, before and after intravenous contrast administration. CONTRAST:  10mL GADAVIST GADOBUTROL 1 MMOL/ML IV SOLN COMPARISON:  CTA head/neck 03/14/2023. FINDINGS: MRI HEAD FINDINGS Brain: No acute infarct or hemorrhage. Mild chronic small-vessel disease. No hydrocephalus or extra-axial collection. No foci of abnormal susceptibility. No mass or abnormal enhancement. Vascular: Normal flow voids. Intracranial atherosclerosis is better evaluated on previous day's head CTA. Skull and upper cervical spine: Normal marrow signal and enhancement. Other: None. MRI ORBITS FINDINGS Orbits: No traumatic or inflammatory finding. Globes, optic nerves, orbital fat, extraocular muscles, vascular structures, and lacrimal glands are normal. Visualized sinuses: Moderate mucosal disease in the right maxillary sinus and bilateral anterior ethmoid air cells. Soft tissues: Unremarkable. IMPRESSION: 1. No acute intracranial abnormality or mass. 2. Mild chronic small-vessel disease. 3. Unremarkable MRI of the orbits. 4. Moderate right maxillary sinus disease. Electronically Signed   By: Orvan Falconer M.D.   On:  03/15/2023 12:41   CT ANGIO HEAD NECK W WO CM  Result Date: 03/14/2023 CLINICAL DATA:  Transient ischemic attack (TIA) intermittent diplopia EXAM: CT ANGIOGRAPHY HEAD AND NECK WITH AND WITHOUT CONTRAST TECHNIQUE: Multidetector CT imaging of the head and neck was performed using the standard protocol during bolus administration of intravenous contrast. Multiplanar CT image reconstructions and MIPs were obtained to evaluate the vascular anatomy. Carotid stenosis measurements (when applicable) are obtained utilizing NASCET criteria, using the distal internal carotid diameter as the denominator. RADIATION DOSE REDUCTION: This exam was performed according to the departmental dose-optimization program which includes automated exposure control, adjustment of the mA and/or kV according to patient size and/or use of iterative reconstruction technique. CONTRAST:  80mL OMNIPAQUE IOHEXOL 350 MG/ML SOLN COMPARISON:  None Available. FINDINGS: CT HEAD FINDINGS Brain: No evidence of acute infarction, hemorrhage, hydrocephalus, extra-axial collection or mass lesion/mass effect. Vascular: See below. Skull: No acute fracture. Sinuses/Orbits: Moderate right maxillary sinus mucosal thickening and mild scattered ethmoid air cell mucosal thickening. No acute orbital findings. Other: No mastoid effusions. Review of the MIP images confirms the above findings CTA NECK FINDINGS Aortic arch: Great vessel origins are patent. Right carotid system: Atherosclerosis at the carotid bifurcation involving the proximal ICA without greater than 50% stenosis relative to the distal vessel. Left carotid system: Carotid bifurcation  atherosclerosis without greater than 50% stenosis. Vertebral arteries: Severe stenosis of the bilateral vertebral artery origins due to calcific atherosclerosis. Otherwise, vertebral arteries are patent without significant stenosis. Skeleton: No acute abnormality on limited assessment. Other neck: No acute abnormality on  limited assessment. Upper chest: Visualized lung apices are clear. Review of the MIP images confirms the above findings CTA HEAD FINDINGS Anterior circulation: Bilateral intracranial ICAs are patent with moderate narrowing bilaterally due to calcific atherosclerosis. Bilateral MCAs and bilateral ACAs are patent without proximal hemodynamically significant stenosis. Posterior circulation: Severe stenosis of the proximal left intradural vertebral artery. Right intradural vertebral artery, basilar artery and both posterior cerebral arteries are patent. Moderate left and mild right P2 PCA stenosis. Venous sinuses: As permitted by contrast timing, patent. Review of the MIP images confirms the above findings IMPRESSION: 1. No emergent large vessel occlusion. 2. Severe left intradural vertebral artery stenosis. 3. Severe bilateral vertebral artery origin stenosis. 4. Moderate bilateral intracranial ICA stenosis. 5. Moderate left and mild right P2 PCA stenosis. Electronically Signed   By: Feliberto Harts M.D.   On: 03/14/2023 17:55    Labs: BNP (last 3 results) No results for input(s): "BNP" in the last 8760 hours. Basic Metabolic Panel: Recent Labs  Lab 03/14/23 1415 03/15/23 0001  NA 135 136  K 3.7 3.7  CL 103 105  CO2 20* 23  GLUCOSE 324* 187*  BUN 9 11  CREATININE 0.98 0.99  CALCIUM 9.3 8.7*  Liver Function Tests: Recent Labs  Lab 03/15/23 0001  AST 17  ALT 17  ALKPHOS 97  BILITOT 0.5  PROT 6.4*  ALBUMIN 3.6  No results for input(s): "LIPASE", "AMYLASE" in the last 168 hours. No results for input(s): "AMMONIA" in the last 168 hours. CBC: Recent Labs  Lab 03/14/23 1415 03/15/23 0001  WBC 10.6* 9.6  HGB 15.5 14.9  HCT 45.1 44.1  MCV 89.7 90.9  PLT 256 225   Recent Labs  Lab 03/15/23 1249 03/15/23 1645 03/15/23 2156 03/16/23 0535 03/16/23 1130  GLUCAP 135* 138* 194* 180* 248*   Recent Labs    03/14/23 2300  HGBA1C 9.0*   Lipid Profile Recent Labs    03/14/23 2300   CHOL 193  HDL 40*  LDLCALC 97  TRIG 696*  CHOLHDL 4.8   Recent Labs  Lab 03/14/23 1415 03/15/23 0001  WBC 10.6* 9.6   Microbiology No results found for this or any previous visit (from the past 240 hour(s)).   Time coordinating discharge: 25 minutes  SIGNED: Lanae Boast, MD  [facility] 03/16/2023, 2:25 PM  If 7PM-7AM, please contact night-coverage [email or web address]

## 2023-03-16 NOTE — Progress Notes (Addendum)
STROKE TEAM PROGRESS NOTE   INTERVAL HISTORY His girlfriend is at the bedside.  Patient states that over the past 2 days he has not had a further episode of diplopia or dizziness.  Patient's girlfriend notes that the patient has not been compliant with medications for approximately 1 year including his bipolar medications and his diabetes medications.  Patient states that of recent, he is hypersomnolent and attributes this somewhat to his moods.  Patient does endorse some agitation regarding hospitalization, cold food, frequent staff interruptions.  Patient explains events as sudden onset of double or triple vision that happen randomly while cooking, driving, sitting and watching television that progressed to room spinning sensation where the patient has to close 1 eye and lean against a sturdy object for approximately 5 minutes before the sensation passes.  Four-vessel cerebral arteriogram today:  1.  Severe 90% stenosis at the origin of the dominant left vertebral artery. 2.  Approximately 50 to 60% stenosis of the left vertebral basilar junction just proximal to the left posterior inferior cerebellar artery.  Vitals:   03/15/23 2015 03/16/23 0014 03/16/23 0536 03/16/23 0830  BP: 121/71 123/78 114/64 132/87  Pulse: 66 61 62 62  Resp: 18 18 17 20   Temp: 98.2 F (36.8 C) 98 F (36.7 C) 98.3 F (36.8 C)   TempSrc: Oral Oral Oral   SpO2: 98% 99% 97% 99%   CBC:  Recent Labs  Lab 03/14/23 1415 03/15/23 0001  WBC 10.6* 9.6  HGB 15.5 14.9  HCT 45.1 44.1  MCV 89.7 90.9  PLT 256 225   Basic Metabolic Panel:  Recent Labs  Lab 03/14/23 1415 03/15/23 0001  NA 135 136  K 3.7 3.7  CL 103 105  CO2 20* 23  GLUCOSE 324* 187*  BUN 9 11  CREATININE 0.98 0.99  CALCIUM 9.3 8.7*   Lipid Panel:  Recent Labs  Lab 03/14/23 2300  CHOL 193  TRIG 279*  HDL 40*  CHOLHDL 4.8  VLDL 56*  LDLCALC 97   HgbA1c:  Recent Labs  Lab 03/14/23 2300  HGBA1C 9.0*   Urine Drug Screen: No  results for input(s): "LABOPIA", "COCAINSCRNUR", "LABBENZ", "AMPHETMU", "THCU", "LABBARB" in the last 168 hours.  Alcohol Level No results for input(s): "ETH" in the last 168 hours.  IMAGING past 24 hours MR BRAIN W WO CONTRAST  Result Date: 03/15/2023 CLINICAL DATA:  Ophthalmoplegia. EXAM: MRI HEAD AND ORBITS WITHOUT AND WITH CONTRAST TECHNIQUE: Multiplanar, multiecho pulse sequences of the brain and surrounding structures were obtained without and with intravenous contrast. Multiplanar, multiecho pulse sequences of the orbits and surrounding structures were obtained including fat saturation techniques, before and after intravenous contrast administration. CONTRAST:  10mL GADAVIST GADOBUTROL 1 MMOL/ML IV SOLN COMPARISON:  CTA head/neck 03/14/2023. FINDINGS: MRI HEAD FINDINGS Brain: No acute infarct or hemorrhage. Mild chronic small-vessel disease. No hydrocephalus or extra-axial collection. No foci of abnormal susceptibility. No mass or abnormal enhancement. Vascular: Normal flow voids. Intracranial atherosclerosis is better evaluated on previous day's head CTA. Skull and upper cervical spine: Normal marrow signal and enhancement. Other: None. MRI ORBITS FINDINGS Orbits: No traumatic or inflammatory finding. Globes, optic nerves, orbital fat, extraocular muscles, vascular structures, and lacrimal glands are normal. Visualized sinuses: Moderate mucosal disease in the right maxillary sinus and bilateral anterior ethmoid air cells. Soft tissues: Unremarkable. IMPRESSION: 1. No acute intracranial abnormality or mass. 2. Mild chronic small-vessel disease. 3. Unremarkable MRI of the orbits. 4. Moderate right maxillary sinus disease. Electronically Signed   By: Zollie Beckers  Wiggins M.D.   On: 03/15/2023 12:41   MR ORBITS W WO CONTRAST  Result Date: 03/15/2023 CLINICAL DATA:  Ophthalmoplegia. EXAM: MRI HEAD AND ORBITS WITHOUT AND WITH CONTRAST TECHNIQUE: Multiplanar, multiecho pulse sequences of the brain and  surrounding structures were obtained without and with intravenous contrast. Multiplanar, multiecho pulse sequences of the orbits and surrounding structures were obtained including fat saturation techniques, before and after intravenous contrast administration. CONTRAST:  10mL GADAVIST GADOBUTROL 1 MMOL/ML IV SOLN COMPARISON:  CTA head/neck 03/14/2023. FINDINGS: MRI HEAD FINDINGS Brain: No acute infarct or hemorrhage. Mild chronic small-vessel disease. No hydrocephalus or extra-axial collection. No foci of abnormal susceptibility. No mass or abnormal enhancement. Vascular: Normal flow voids. Intracranial atherosclerosis is better evaluated on previous day's head CTA. Skull and upper cervical spine: Normal marrow signal and enhancement. Other: None. MRI ORBITS FINDINGS Orbits: No traumatic or inflammatory finding. Globes, optic nerves, orbital fat, extraocular muscles, vascular structures, and lacrimal glands are normal. Visualized sinuses: Moderate mucosal disease in the right maxillary sinus and bilateral anterior ethmoid air cells. Soft tissues: Unremarkable. IMPRESSION: 1. No acute intracranial abnormality or mass. 2. Mild chronic small-vessel disease. 3. Unremarkable MRI of the orbits. 4. Moderate right maxillary sinus disease. Electronically Signed   By: Orvan Falconer M.D.   On: 03/15/2023 12:41    PHYSICAL EXAM Constitutional: Appears well-developed and well-nourished.  Psych: Affect appropriate to situation, calm and cooperative with exam. Eyes: No scleral injection HENT: No OP obstrucion MSK: No joint deformities or swelling Cardiovascular: Normal rate and regular rhythm.  Respiratory: Effort normal, non-labored breathing GI: Soft.  No distension. There is no tenderness.  Skin: WDI  Neuro: Mental Status: Patient is awake, alert, oriented to person, place, month, year, and situation. Patient is able to give a clear and coherent history. No signs of aphasia or neglect Speech is fluent  without dysarthria. Cranial Nerves: II: Visual Fields are full. Pupils are equal, round, and reactive to light.   III,IV, VI: EOMI without ptosis or diploplia.  V: Facial sensation is intact and symmetric to light touch VII: Facial movement is symmetric resting and with movement VIII: Hearing is intact to voice X: Palate elevates symmetrically XI: Shoulder shrug is symmetric. XII: Tongue protrudes midline without atrophy or fasciculations.  Motor: Tone is normal. Bulk is normal. 5/5 strength was present in all four extremities.  Sensory: Sensation is symmetric to light touch and temperature in the arms and legs. No extinction to DSS present.  Cerebellar: FNF and HKS are intact bilaterally  ASSESSMENT/PLAN Mr. Devin Ellison is a 67 y.o. male with history of DM2 non-insulin dependent, HTN, HLD, tobacco use, MDD, GAD, OSA not on CPAP, and bipolar disorder presenting with recurrent episodes lasting approximately 5 minutes of horizontal diplopia and dizziness daily for approximately one week. The diplopia resolves if he closes one eye and leans against an object to steady himself.   Stroke-like symptoms, etiology unclear, ? Migraine aura, doubt posterior TIA, however does have 50-60% stenosis of left VBJ  Presented with episodes of diplopia feeling of diplopia/crossed eyes, dizziness/vertigo lasting 5 min, once a day for the last week CT angio head and neck 03/14/23: No emergent LVO, severe left intradural vertebral artery stenosis. Severe bilateral vertebral artery origin stenosis. Moderate bilateral intracranial ICA stenosis. Moderate left and mild right P2 PCA stenosis.  MRI brain, MRI orbits with and without contrast 03/15/23: No acute intracranial abnormality or mass. Mild chronic small-vessel disease. Unremarkable MRI of the orbits.  Cerebral arteriogram 03/16/23: Severe 90%  stenosis at the origin of the dominant left VA. Approximately 50-60% stenosis of the left VBJ just proximal to the left  PICA  2D Echo LVEF 60-65% LDL 97 HgbA1c 9.0 Orthostatic vital signs negative VTE prophylaxis - Lovenox SQ No antithrombotic prior to admission, now on aspirin 81 mg daily and clopidogrel 75 mg daily for 3 months followed by asa monotherapy  Therapy recommendations:  None Disposition:  Home  Hypertension Home meds:  amlodipine, losartan Stable Orthostatic vital no orthostatic hypotension Long-term BP goal normotensive  Hyperlipidemia Home meds:  atorvastatin 10 mg, resumed in hospital LDL 97, goal < 70 Increase home atorvastatin to 20 mg daily  High intensity statin not indicated as patient is near goal on lower dosing  Continue statin at discharge  Diabetes type II Uncontrolled Home meds:  Metformin, Jardiance HgbA1c 9.0, goal < 7.0 CBGs SSI Will need close PCP follow up for better glucose control   Other Stroke Risk Factors Advanced Age >/= 21  Obstructive sleep apnea, not on CPAP at home  Other Active Problems Bipolar disorder On home Lamictal, Latuda, Depakote ER Stopped medications approximately 1 year ago  Hospital day # 0  Devin Ellison [facility] [phone]  ATTENDING NOTE: I reviewed above note and agree with the assessment and plan. Pt was seen and examined.   Wife at the bedside. Pt currently symptoms free, at baseline.  However stated that for the last 1 months he was very tired, sleepy, able to sleep 8 hours straight EF without disturbance.  1 week ago he started to have episode of diplopia, vertigo lasting 5 minutes and resolves.  It could happen during standing or reclining in recliner, during meal preparation or watching TV.  Denies any headache, speech difficulty, arm or leg weakness.  MRI negative.  Cerebral angiogram showed left VA origin 90% stenosis, left VBJ 50 to 60% stenosis.  Right VA and basilar artery patent.  Etiology for patient's symptoms not quite clear, doubt posterior TIA as no stroke on MRI and right VA and basilar  artery patent.  Not sure about the significance of left VA origin stenosis but right VBJ only 50 to 60% stenosis.  However patient does have risk factor for atherosclerosis including hypertension, hyperlipidemia and uncontrolled diabetes.  Put on DAPT for 3 months and then aspirin alone, increase Lipitor from 10-20.  PT and OT no recommendation.  Follow-up with [facility] Dr. Pearlean Ellison.  For detailed assessment and plan, please refer to above/below as I have made changes wherever appropriate.   Neurology will sign off. Please call with questions. Pt will follow up with stroke clinic Dr. Pearlean Ellison at [facility] in about 4 weeks. Thanks for the consult.   Marvel Plan, MD PhD Stroke Neurology 03/16/2023 9:22 PM  I discussed with Dr. Jonathon Ellison. I spent extensive face-to-face time with the patient, more than 50% of which was spent in counseling and coordination of care, reviewing test results, images and medication, and discussing the diagnosis, treatment plan and potential prognosis. This patient's care requiresreview of multiple databases, neurological assessment, discussion with family, other specialists and medical decision making of high complexity.       To contact Stroke Continuity provider, please refer to [email or web address]. After hours, contact General Neurology

## 2023-03-16 NOTE — Procedures (Signed)
INR.  Status post four-vessel cerebral arteriogram.  Right radial approach.  Findings.  1.  Severe 90% stenosis at the origin of the dominant left vertebral artery. 2.  Approximately 50 to 60% stenosis of the left vertebral basilar junction just proximal to the left posterior inferior cerebellar artery.  Fatima Sanger MD

## 2023-03-16 NOTE — Progress Notes (Addendum)
Discharge instructions given, patient verbalized understanding Dropped to main Entrance A to spouse car

## 2023-03-16 NOTE — Progress Notes (Signed)
Patient off the unit for Angiography IR

## 2023-03-23 DIAGNOSIS — Z8673 Personal history of transient ischemic attack (TIA), and cerebral infarction without residual deficits: Secondary | ICD-10-CM | POA: Diagnosis not present

## 2023-03-23 DIAGNOSIS — Z09 Encounter for follow-up examination after completed treatment for conditions other than malignant neoplasm: Secondary | ICD-10-CM | POA: Diagnosis not present

## 2023-03-23 DIAGNOSIS — E1169 Type 2 diabetes mellitus with other specified complication: Secondary | ICD-10-CM | POA: Diagnosis not present

## 2023-03-23 DIAGNOSIS — E1165 Type 2 diabetes mellitus with hyperglycemia: Secondary | ICD-10-CM | POA: Diagnosis not present

## 2023-03-23 DIAGNOSIS — I10 Essential (primary) hypertension: Secondary | ICD-10-CM | POA: Diagnosis not present

## 2023-03-23 DIAGNOSIS — Z720 Tobacco use: Secondary | ICD-10-CM | POA: Diagnosis not present

## 2023-05-09 ENCOUNTER — Inpatient Hospital Stay: Payer: PPO | Admitting: Neurology

## 2023-05-09 ENCOUNTER — Encounter: Payer: Self-pay | Admitting: Neurology

## 2023-05-18 ENCOUNTER — Telehealth: Payer: Self-pay | Admitting: Neurology

## 2023-05-18 NOTE — Telephone Encounter (Signed)
Pt's wife called wanting to inform the provider that the reason that he missed his scheduled appt is that he has been incarcerated and will have to r/s after Dec. 2025

## 2023-08-15 IMAGING — CT CT ABD-PELV W/ CM
2 of 5 series · 16 of 46 positions shown, 18 images · IV contrast (APPLIED)
Comparison: CT chest, 03/21/2005

CLINICAL DATA: Severe abdominal pain following colonoscopy and
polyp removal performed recently

EXAM:
CT ABDOMEN AND PELVIS WITH CONTRAST
TECHNIQUE: Multidetector CT imaging of the abdomen and pelvis was performed
using the standard protocol following bolus administration of
intravenous contrast.

[Series 3: abdomen 5.0 · axial · 0.98mm/px · z∈[+793,+1208]mm · 13 of 97 slices shown, 15 images]
[im 7/97  soft-tissue]
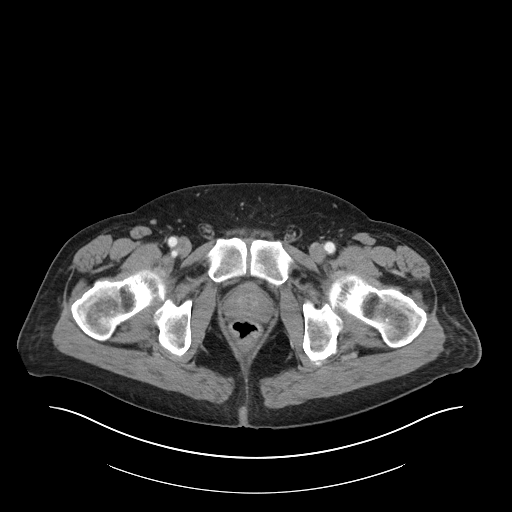
[im 7/97  bone]
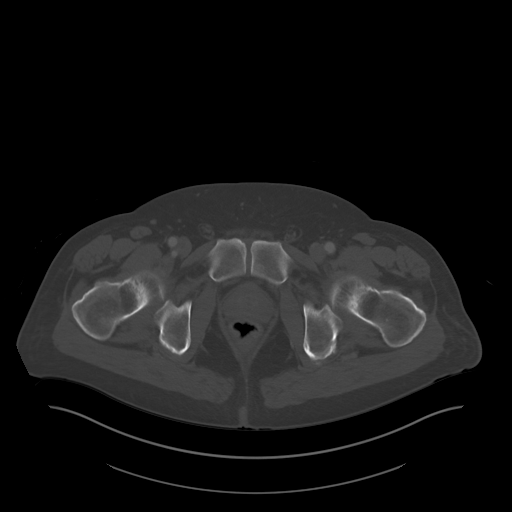
[im 13/97  soft-tissue]
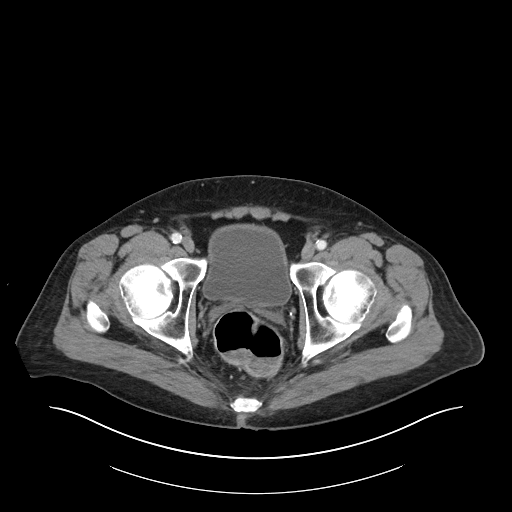
[im 20/97  soft-tissue]
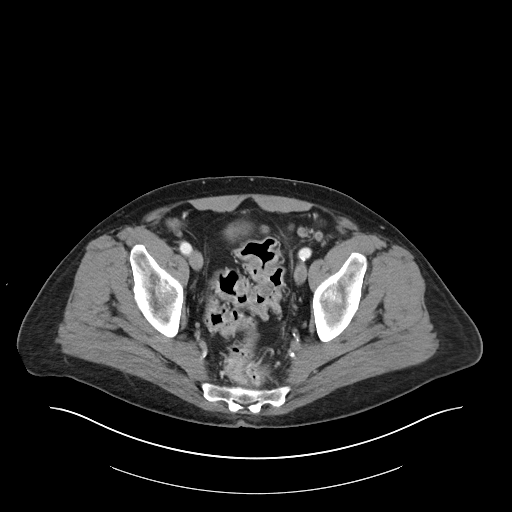
[im 26/97  soft-tissue]
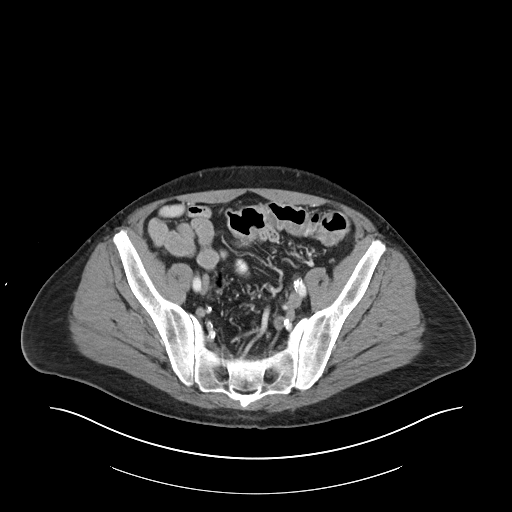
[im 33/97  soft-tissue]
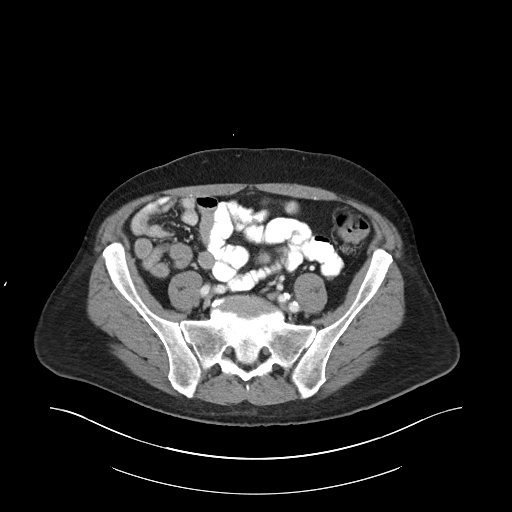
[im 39/97  soft-tissue]
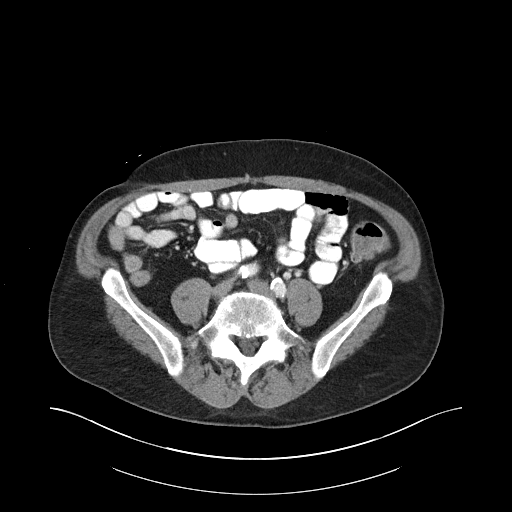
[im 52/97  soft-tissue]
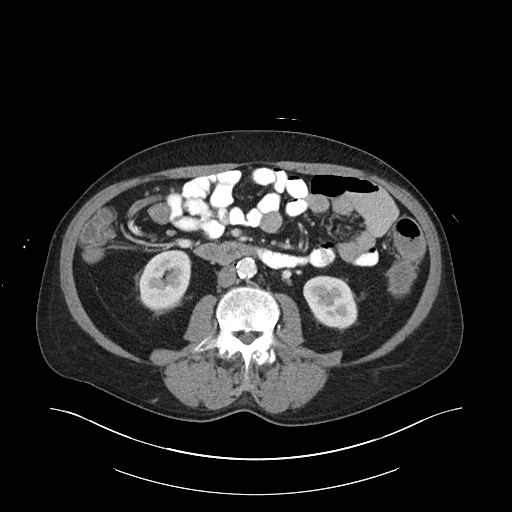
[im 58/97  soft-tissue]
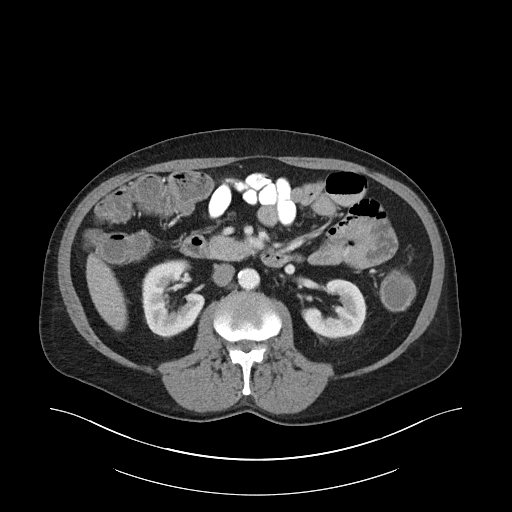
[im 65/97  soft-tissue]
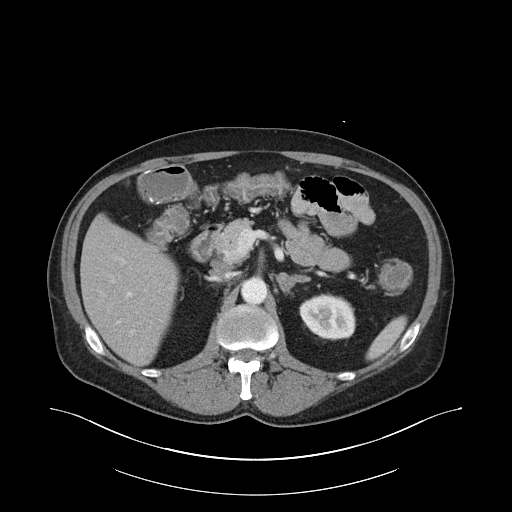
[im 65/97  bone]
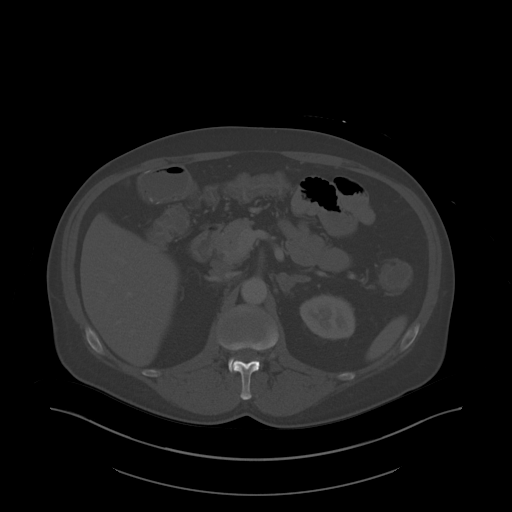
[im 71/97  soft-tissue]
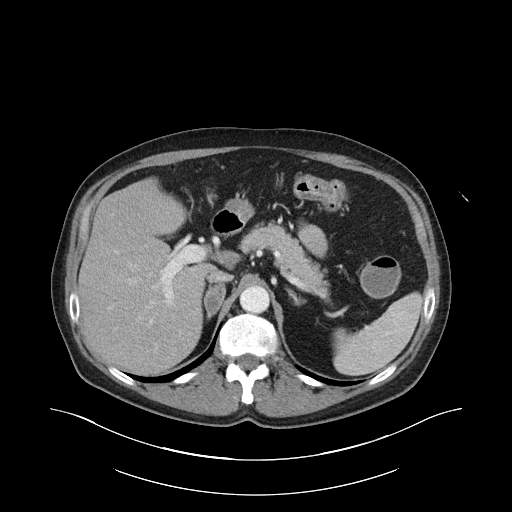
[im 77/97  soft-tissue]
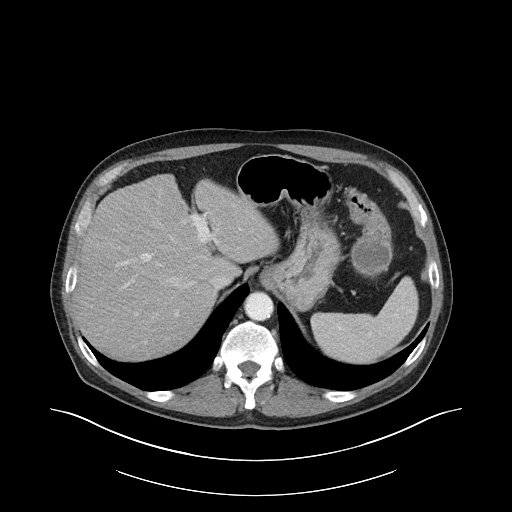
[im 84/97  soft-tissue]
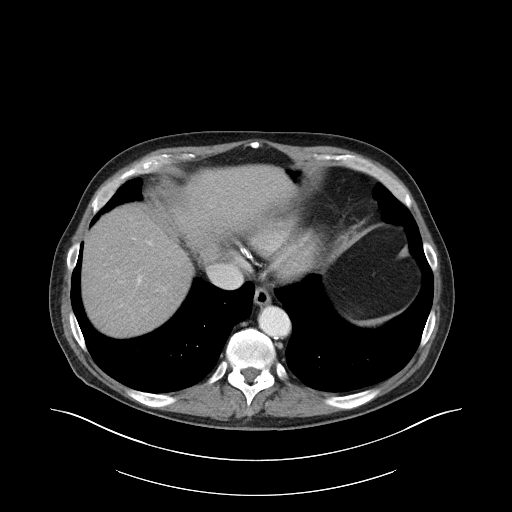
[im 90/97  soft-tissue]
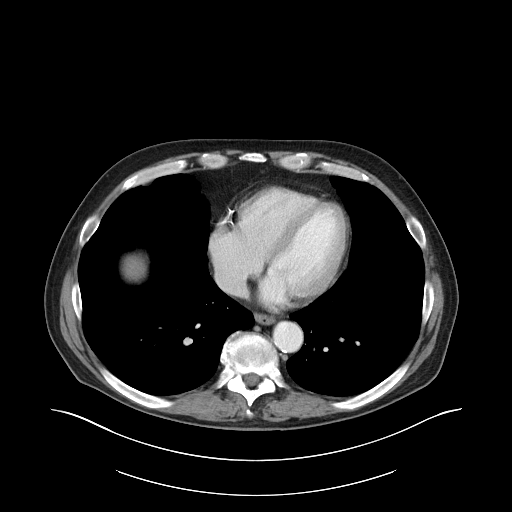

[Series 6: abdomen 3.0 mpr cor · coronal · 0.90mm/px · 3 of 102 slices shown]
[im 34/102  soft-tissue]
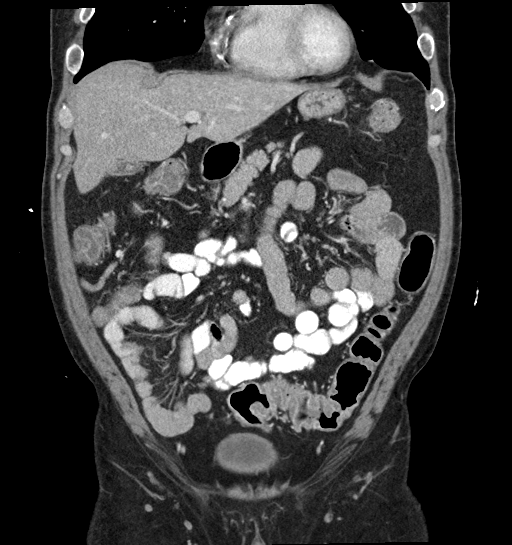
[im 45/102  soft-tissue]
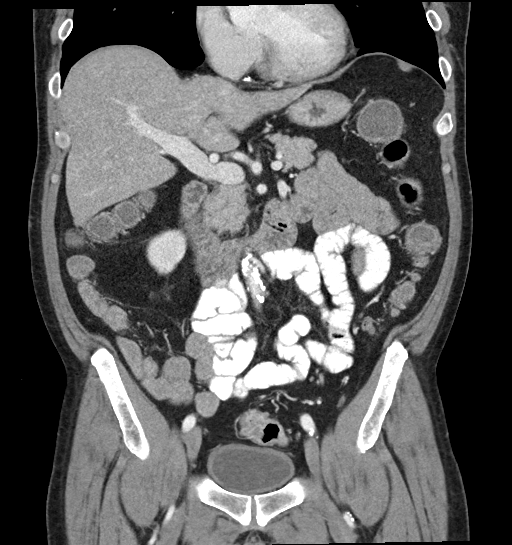
[im 57/102  soft-tissue]
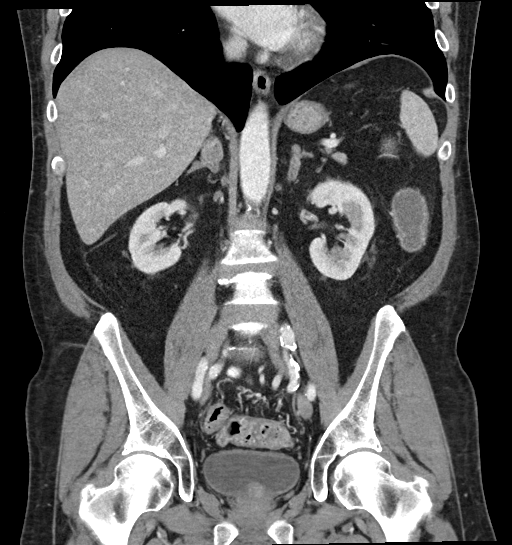

[16 of 46 positions shown; findings below may reference images not displayed]

RADIATION DOSE REDUCTION: This exam was performed according to the
departmental dose-optimization program which includes automated
exposure control, adjustment of the mA and/or kV according to
patient size and/or use of iterative reconstruction technique.

CONTRAST:  100mL OMNIPAQUE IOHEXOL 300 MG/ML SOLN, additional oral
enteric contrast
FINDINGS: Lower chest: No acute abnormality. Mild, bandlike scarring of the
bilateral lung bases. Coronary artery calcifications.

Hepatobiliary: No solid liver abnormality is seen. Gallstones in the
gallbladder. No gallbladder wall thickening, or biliary dilatation.

Pancreas: Unremarkable. No pancreatic ductal dilatation or
surrounding inflammatory changes.

Spleen: Normal in size without significant abnormality.

Adrenals/Urinary Tract: Stable, benign right adrenal adenoma (series
3, image 27). Kidneys are normal, without renal calculi, solid
lesion, or hydronephrosis. Bladder is unremarkable.

Stomach/Bowel: Stomach is within normal limits. Appendix appears
normal. Diffuse colonic wall thickening and mucosal hyperenhancement
with vascular combing. Descending and sigmoid diverticulosis. The
colon is fluid-filled to the rectum.

Vascular/Lymphatic: Aortic atherosclerosis. No enlarged abdominal or
pelvic lymph nodes.

Reproductive: No mass or other significant abnormality.

Other: No abdominal wall hernia or abnormality. No ascites.

Musculoskeletal: No acute or significant osseous findings.
IMPRESSION: 1. Diffuse colonic wall thickening and mucosal hyperenhancement with
vascular combing, consistent with nonspecific infectious,
inflammatory, or ischemic colitis. The colon is fluid-filled to the
rectum, suggesting diarrheal illness.
2. No evidence of perforation or other procedural complication
following colonoscopy.
3. Descending and sigmoid diverticulosis without evidence of acute
diverticulitis.
4. Cholelithiasis without evidence of acute cholecystitis.
5. Coronary artery disease.

Aortic Atherosclerosis ([id number]).
